# Patient Record
Sex: Female | Born: 1995 | Race: White | Hispanic: No | Marital: Single | Smoking: Never smoker
Health system: Southern US, Community
[De-identification: ages and names within clinical notes are randomized; demographics above are authoritative.]

## PROBLEM LIST (undated history)

## (undated) ENCOUNTER — Inpatient Hospital Stay: Payer: Self-pay

## (undated) ENCOUNTER — Emergency Department: Admission: EM | Payer: Medicaid Other

## (undated) DIAGNOSIS — F329 Major depressive disorder, single episode, unspecified: Secondary | ICD-10-CM

## (undated) DIAGNOSIS — Z87440 Personal history of urinary (tract) infections: Secondary | ICD-10-CM

## (undated) DIAGNOSIS — Z3042 Encounter for surveillance of injectable contraceptive: Secondary | ICD-10-CM

## (undated) DIAGNOSIS — O139 Gestational [pregnancy-induced] hypertension without significant proteinuria, unspecified trimester: Secondary | ICD-10-CM

## (undated) DIAGNOSIS — F909 Attention-deficit hyperactivity disorder, unspecified type: Secondary | ICD-10-CM

## (undated) DIAGNOSIS — F32A Depression, unspecified: Secondary | ICD-10-CM

## (undated) HISTORY — DX: Depression, unspecified: F32.A

## (undated) HISTORY — DX: Personal history of urinary (tract) infections: Z87.440

## (undated) HISTORY — PX: NO PAST SURGERIES: SHX2092

## (undated) HISTORY — DX: Gestational (pregnancy-induced) hypertension without significant proteinuria, unspecified trimester: O13.9

---
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
## 2014-01-04 LAB — OB RESULTS CONSOLE GC/CHLAMYDIA
Chlamydia: NEGATIVE
Gonorrhea: NEGATIVE

## 2014-03-04 NOTE — L&D Delivery Note (Signed)
VAGINAL DELIVERY NOTE:  Date of Delivery: 07/31/2014 Primary OB: [facility]  Gestational Age/EDD: 2564w2d 07/29/2014, by Ultrasound Antepartum complications: none Attending Physician: Annamarie MajorPaul Hasson Gaspard, MD, FACOG Delivery Type: spontaneous vaginal delivery  Anesthesia: epidural Laceration: 1st degree Episiotomy: none Placenta: spontaneous Intrapartum complications: None Estimated Blood Loss: 500ml GBS: POS Procedure Details: No complications  Baby: Liveborn female, Apgars 5/9, weight 8 #, 0 oz,

## 2014-03-05 ENCOUNTER — Emergency Department: Payer: Self-pay | Admitting: Emergency Medicine

## 2014-07-01 LAB — OB RESULTS CONSOLE GBS: STREP GROUP B AG: POSITIVE

## 2014-07-08 LAB — OB RESULTS CONSOLE GBS: GBS: POSITIVE

## 2014-07-22 ENCOUNTER — Observation Stay
Admission: EM | Admit: 2014-07-22 | Discharge: 2014-07-22 | Disposition: A | Discharge status: Home / Self Care | Payer: Medicaid Other | Attending: Obstetrics and Gynecology | Admitting: Obstetrics and Gynecology

## 2014-07-22 ENCOUNTER — Encounter: Payer: Self-pay | Admitting: *Deleted

## 2014-07-22 DIAGNOSIS — O26893 Other specified pregnancy related conditions, third trimester: Principal | ICD-10-CM | POA: Insufficient documentation

## 2014-07-22 DIAGNOSIS — R1030 Lower abdominal pain, unspecified: Secondary | ICD-10-CM | POA: Diagnosis present

## 2014-07-22 DIAGNOSIS — Z3A39 39 weeks gestation of pregnancy: Secondary | ICD-10-CM | POA: Diagnosis not present

## 2014-07-22 NOTE — H&P (Signed)
Obstetric H&P   Chief Complaint: Sharp abdominal pains  Prenatal Care Provider: [facility]  History of Present Illness: 19 y.o. G1 at 4539 weeks gestation presenting with intermittent sharp lower abdominal pain.  Radiate into groin, positional.  No LOF, no VB, no ctx.  Pregnancy at [facility] thus far uncomplicated.    Review of Systems: 10 point review of systems negative unless otherwise noted in HPI  Past Medical History: No past medical history on file.  Past Surgical History: No past surgical history on file.  Family History: No family history on file.  Social History: History   Social History  . Marital Status: Single    Spouse Name: N/A  . Number of Children: N/A  . Years of Education: N/A   Occupational History  . Not on file.   Social History Main Topics  . Smoking status: Not on file  . Smokeless tobacco: Not on file  . Alcohol Use: Not on file  . Drug Use: Not on file  . Sexual Activity: Not on file   Other Topics Concern  . Not on file   Social History Narrative  . No narrative on file    Medications: Prior to Admission medications   Not on File    Allergies: Allergies not on file  Physical Exam: Vitals: There were no vitals taken for this visit.  Urine Dip Protein: N/A  FHT: 140, moderate variability, +accles, no decels Toco: none  General: NAD Pulmonary: no increased work of breathing Abdomen: Gravid,  Non-tender, non-distended Leopolds: vtx, 6lbs Genitourinary: Dilation: 1 Effacement (%): 50 Cervical Position: Posterior Station: -3 Presentation: Vertex Exam by:: Bonney AidStaebler, MD  Extremities: no edema  Labs: No results found for this or any previous visit (from the past 24 hour(s)).  Assessment: 19 y.o. 3552w0d discomfort of pregnancy  Plan: 1) R/O labor - no contraction on toco and 1cm cervix firm and posterior  2) Fetus - category I tracing  3) Disposition - home, missed clinic appointment this week instructed to call and arrange early  next week

## 2014-07-26 ENCOUNTER — Observation Stay
Admission: EM | Admit: 2014-07-26 | Discharge: 2014-07-26 | Disposition: A | Discharge status: Home / Self Care | Payer: Medicaid Other | Attending: Obstetrics & Gynecology | Admitting: Obstetrics & Gynecology

## 2014-07-26 NOTE — Discharge Instructions (Signed)
Braxton Hicks Contractions °Contractions of the uterus can occur throughout pregnancy. Contractions are not always a sign that you are in labor.  °WHAT ARE BRAXTON HICKS CONTRACTIONS?  °Contractions that occur before labor are called Braxton Hicks contractions, or false labor. Toward the end of pregnancy (32-34 weeks), these contractions can develop more often and may become more forceful. This is not true labor because these contractions do not result in opening (dilatation) and thinning of the cervix. They are sometimes difficult to tell apart from true labor because these contractions can be forceful and people have different pain tolerances. You should not feel embarrassed if you go to the hospital with false labor. Sometimes, the only way to tell if you are in true labor is for your health care provider to look for changes in the cervix. °If there are no prenatal problems or other health problems associated with the pregnancy, it is completely safe to be sent home with false labor and await the onset of true labor. °HOW CAN YOU TELL THE DIFFERENCE BETWEEN TRUE AND FALSE LABOR? °False Labor °· The contractions of false labor are usually shorter and not as hard as those of true labor.   °· The contractions are usually irregular.   °· The contractions are often felt in the front of the lower abdomen and in the groin.   °· The contractions may go away when you walk around or change positions while lying down.   °· The contractions get weaker and are shorter lasting as time goes on.   °· The contractions do not usually become progressively stronger, regular, and closer together as with true labor.   °True Labor °· Contractions in true labor last 30-70 seconds, become very regular, usually become more intense, and increase in frequency.   °· The contractions do not go away with walking.   °· The discomfort is usually felt in the top of the uterus and spreads to the lower abdomen and low back.   °· True labor can be  determined by your health care provider with an exam. This will show that the cervix is dilating and getting thinner.   °WHAT TO REMEMBER °· Keep up with your usual exercises and follow other instructions given by your health care provider.   °· Take medicines as directed by your health care provider.   °· Keep your regular prenatal appointments.   °· Eat and drink lightly if you think you are going into labor.   °· If Braxton Hicks contractions are making you uncomfortable:   °¨ Change your position from lying down or resting to walking, or from walking to resting.   °¨ Sit and rest in a tub of warm water.   °¨ Drink 2-3 glasses of water. Dehydration may cause these contractions.   °¨ Do slow and deep breathing several times an hour.   °WHEN SHOULD I SEEK IMMEDIATE MEDICAL CARE? °Seek immediate medical care if: °· Your contractions become stronger, more regular, and closer together.   °· You have fluid leaking or gushing from your vagina.   °· You have a fever.   °· You pass blood-tinged mucus.   °· You have vaginal bleeding.   °· You have continuous abdominal pain.   °· You have low back pain that you never had before.   °· You feel your baby's head pushing down and causing pelvic pressure.   °· Your baby is not moving as much as it used to.   °Document Released: 02/18/2005 Document Revised: 02/23/2013 Document Reviewed: 11/30/2012 °[employer]® Patient Information ©2015 [employer]. This information is not intended to replace advice given to you by your health care   provider. Make sure you discuss any questions you have with your health care provider. ° °

## 2014-07-26 NOTE — Progress Notes (Signed)
Patient ID: [name], female   DOB: 1995/04/21, 19 y.o.   MRN: [id number]  No change in cervix after walking one hour.   Feels ctxs and they are moderate intensity. FWR Pt will go home w instructions for f/u

## 2014-07-26 NOTE — OB Triage Provider Note (Signed)
Progress Note CC: Ctx Pain  Subjective:   Vicente SereneKrista Gordon Dupre is a 19 y.o. female. She is at 633w4d gestation. She has ctx pain today.  Sex last pm.  Mucus d/c, no ROM.  No VB.  Seen last week and noted to be 1 cm.  Her pregnancy has been complicated by: none. PNC at [facility]. PMH, PSH, POBH and problem list reviewed Medications and allergies reviewed.    Objective:   General appearance: alert, well appearing, and in no distress. External fetal monitoring: 140s Abd gravid, NT Ctxs q 5-10 min SVE 1 cm  Assessment:   Pregnancy at 5933w4d with concerns for labor   Plan:    Ambulate Monitor for cervical change  Orders placed: Orders Placed This Encounter  Procedures  . Vitals signs per unit policy    Standing Status: Standing     Number of Occurrences: 1     Standing Expiration Date:   . Notify Physician    Standing Status: Standing     Number of Occurrences: 1     Standing Expiration Date:     Order Specific Question:  Notify Physician    Answer:  for vaginal bleeding    Order Specific Question:  Notify Physician    Answer:  for acute abdominal pain    Order Specific Question:  Notify Physician    Answer:  for temperature >/= 100.4 F    Order Specific Question:  Notify Physician    Answer:  for significant change in vital signs    Order Specific Question:  Notify Physician    Answer:  for non-reassuring fetal heart rate pattern    Order Specific Question:  Notify Physician    Answer:  for imminent delivery or failure to progress  . Fetal monitoring per unit policy    Standing Status: Standing     Number of Occurrences: 1     Standing Expiration Date:   . Cervical Exam    Unless contraindicated, every 1-2 hours in active labor, or at nurse's discretion.    Standing Status: Standing     Number of Occurrences: 1     Standing Expiration Date:   . Full code    Standing Status: Standing     Number of Occurrences: 1     Standing Expiration Date:   . Place in observation  (patient's expected length of stay will be less than 2 midnights)    Standing Status: Standing     Number of Occurrences: 1     Standing Expiration Date:     Order Specific Question:  Hospital Area    Answer:  [facility] [[id number]]    Order Specific Question:  Diagnosis    Answer:  Labor and delivery, indication for care [[id number]]    Order Specific Question:  Level of Care    Answer:  BIRTHING SUITE [17]    Order Specific Question:  Admitting Physician    Answer:  Nadara MustardHARRIS, Sire Poet P [[id number]]    Order Specific Question:  Attending Physician    Answer:  Nadara MustardHARRIS, Jerone Cudmore P [[id number]]    Order Specific Question:  PT Class (Do Not Modify)    Answer:  Observation [104]    Order Specific Question:  PT Acc Code (Do Not Modify)    Answer:  Observation [10022]    Patient expresses understanding of information provided and plan of care.

## 2014-07-26 NOTE — OB Triage Note (Signed)
Patient arrives with complain tof contractions that started this am at 0930, ongoing with a pain rating of 5/10. Reports discharge, blood tinged, denies SROM. Pt reports sexual intercourse last night.

## 2014-07-30 ENCOUNTER — Inpatient Hospital Stay: Payer: Medicaid Other | Admitting: Anesthesiology

## 2014-07-30 ENCOUNTER — Inpatient Hospital Stay
Admission: EM | Admit: 2014-07-30 | Discharge: 2014-08-01 | DRG: 775 | Disposition: A | Discharge status: Home / Self Care | Payer: Medicaid Other | Attending: Obstetrics & Gynecology | Admitting: Obstetrics & Gynecology

## 2014-07-30 ENCOUNTER — Inpatient Hospital Stay
Admission: EM | Admit: 2014-07-30 | Discharge: 2014-07-30 | Disposition: A | Discharge status: Home / Self Care | Payer: Medicaid Other | Source: Home / Self Care | Admitting: Obstetrics and Gynecology

## 2014-07-30 ENCOUNTER — Encounter: Payer: Self-pay | Admitting: *Deleted

## 2014-07-30 DIAGNOSIS — Z3A39 39 weeks gestation of pregnancy: Secondary | ICD-10-CM | POA: Diagnosis present

## 2014-07-30 DIAGNOSIS — O479 False labor, unspecified: Secondary | ICD-10-CM | POA: Diagnosis present

## 2014-07-30 DIAGNOSIS — Z3403 Encounter for supervision of normal first pregnancy, third trimester: Secondary | ICD-10-CM | POA: Diagnosis present

## 2014-07-30 HISTORY — DX: Attention-deficit hyperactivity disorder, unspecified type: F90.9

## 2014-07-30 LAB — CBC
HEMATOCRIT: 41.7 % (ref 35.0–47.0)
HEMOGLOBIN: 14.1 g/dL (ref 12.0–16.0)
MCH: 31.2 pg (ref 26.0–34.0)
MCHC: 33.8 g/dL (ref 32.0–36.0)
MCV: 92.2 fL (ref 80.0–100.0)
Platelets: 232 10*3/uL (ref 150–440)
RBC: 4.52 MIL/uL (ref 3.80–5.20)
RDW: 13.7 % (ref 11.5–14.5)
WBC: 15.5 10*3/uL — ABNORMAL HIGH (ref 3.6–11.0)

## 2014-07-30 LAB — TYPE AND SCREEN
ABO/RH(D): B POS
Antibody Screen: NEGATIVE

## 2014-07-30 LAB — ABO/RH: ABO/RH(D): B POS

## 2014-07-30 MED ORDER — SODIUM CHLORIDE 0.9 % IV SOLN
INTRAVENOUS | Status: AC
Start: 2014-07-30 — End: 2014-07-31
  Filled 2014-07-30: qty 2000

## 2014-07-30 MED ORDER — TERBUTALINE SULFATE 1 MG/ML IJ SOLN: 0.2500 mg | Freq: Once | INTRAMUSCULAR | Status: AC | PRN

## 2014-07-30 MED ORDER — BUTORPHANOL TARTRATE 1 MG/ML IJ SOLN
INTRAMUSCULAR | Status: AC
  Administered 2014-07-30: 1 mg
  Filled 2014-07-30: qty 1

## 2014-07-30 MED ORDER — OXYTOCIN 40 UNITS IN LACTATED RINGERS INFUSION - SIMPLE MED
INTRAVENOUS | Status: AC
  Administered 2014-07-30: 2 mL/h via INTRAVENOUS
  Filled 2014-07-30: qty 1000

## 2014-07-30 MED ORDER — LACTATED RINGERS IV SOLN
500.0000 mL | INTRAVENOUS | Status: DC | PRN
  Administered 2014-07-30: 1000 mL via INTRAVENOUS

## 2014-07-30 MED ORDER — OXYTOCIN 40 UNITS IN LACTATED RINGERS INFUSION - SIMPLE MED
62.5000 mL/h | INTRAVENOUS | Status: DC
  Administered 2014-07-30: 2 mL/h via INTRAVENOUS
  Administered 2014-07-30: 4 mL/h via INTRAVENOUS

## 2014-07-30 MED ORDER — SODIUM CHLORIDE 0.9 % IV SOLN
1.0000 g | INTRAVENOUS | Status: DC
  Administered 2014-07-30 (×2): 1 g via INTRAVENOUS
  Filled 2014-07-30 (×3): qty 1000

## 2014-07-30 MED ORDER — SODIUM CHLORIDE 0.9 % IV SOLN
2.0000 g | Freq: Once | INTRAVENOUS | Status: AC
  Administered 2014-07-30: 2 g via INTRAVENOUS

## 2014-07-30 MED ORDER — ACETAMINOPHEN 325 MG PO TABS: 650.0000 mg | ORAL_TABLET | ORAL | Status: DC | PRN

## 2014-07-30 MED ORDER — SODIUM CHLORIDE 0.9 % IV SOLN
INTRAVENOUS | Status: AC
  Administered 2014-07-30: 1 g via INTRAVENOUS
  Filled 2014-07-30: qty 1000

## 2014-07-30 MED ORDER — ONDANSETRON HCL 4 MG/2ML IJ SOLN: 4.0000 mg | Freq: Four times a day (QID) | INTRAMUSCULAR | Status: DC | PRN

## 2014-07-30 MED ORDER — OXYTOCIN BOLUS FROM INFUSION: 500.0000 mL | INTRAVENOUS | Status: DC

## 2014-07-30 MED ORDER — FENTANYL 2.5 MCG/ML W/ROPIVACAINE 0.2% IN NS 100 ML EPIDURAL INFUSION (ARMC-ANES)
10.0000 mL/h | EPIDURAL | Status: DC
  Administered 2014-07-30: 10 mL/h via EPIDURAL

## 2014-07-30 MED ORDER — LACTATED RINGERS IV BOLUS (SEPSIS)
500.0000 mL | Freq: Once | INTRAVENOUS | Status: AC
  Administered 2014-07-30: 1000 mL via INTRAVENOUS

## 2014-07-30 MED ORDER — FENTANYL 2.5 MCG/ML W/ROPIVACAINE 0.2% IN NS 100 ML EPIDURAL INFUSION (ARMC-ANES)
EPIDURAL | Status: AC
  Administered 2014-07-30: 10 mL/h via EPIDURAL
  Filled 2014-07-30: qty 100

## 2014-07-30 MED ORDER — BUTORPHANOL TARTRATE 1 MG/ML IJ SOLN
1.0000 mg | INTRAMUSCULAR | Status: DC | PRN
  Administered 2014-07-30: 1 mg via INTRAVENOUS

## 2014-07-30 MED ORDER — OXYTOCIN 10 UNIT/ML IJ SOLN
INTRAMUSCULAR | Status: AC
  Filled 2014-07-30: qty 2

## 2014-07-30 MED ORDER — AMMONIA AROMATIC IN INHA
RESPIRATORY_TRACT | Status: AC
  Filled 2014-07-30: qty 10

## 2014-07-30 MED ORDER — DIPHENHYDRAMINE HCL 50 MG/ML IJ SOLN
12.5000 mg | INTRAMUSCULAR | Status: DC | PRN
Start: 2014-07-30 — End: 2014-07-31

## 2014-07-30 MED ORDER — PHENYLEPHRINE 40 MCG/ML (10ML) SYRINGE FOR IV PUSH (FOR BLOOD PRESSURE SUPPORT)
80.0000 ug | PREFILLED_SYRINGE | INTRAVENOUS | Status: DC | PRN
  Filled 2014-07-30: qty 2

## 2014-07-30 MED ORDER — BUTORPHANOL TARTRATE 1 MG/ML IJ SOLN
INTRAMUSCULAR | Status: AC
  Administered 2014-07-30: 1 mg via INTRAVENOUS
  Filled 2014-07-30: qty 1

## 2014-07-30 MED ORDER — FENTANYL 2.5 MCG/ML BUPIVACAINE 1/10 % EPIDURAL INFUSION (WH - ANES): 10.0000 mL/h | INTRAMUSCULAR | Status: DC

## 2014-07-30 MED ORDER — ONDANSETRON HCL 4 MG/2ML IJ SOLN
INTRAMUSCULAR | Status: AC
  Administered 2014-07-30: 4 mg
  Filled 2014-07-30: qty 2

## 2014-07-30 MED ORDER — MISOPROSTOL 200 MCG PO TABS
ORAL_TABLET | ORAL | Status: AC
  Filled 2014-07-30: qty 4

## 2014-07-30 MED ORDER — LIDOCAINE HCL (PF) 1 % IJ SOLN
INTRAMUSCULAR | Status: AC
  Filled 2014-07-30: qty 30

## 2014-07-30 MED ORDER — OXYTOCIN 40 UNITS IN LACTATED RINGERS INFUSION - SIMPLE MED
1.0000 m[IU]/min | INTRAVENOUS | Status: DC
  Administered 2014-07-30: 2.667 m[IU]/min via INTRAVENOUS

## 2014-07-30 MED ORDER — LACTATED RINGERS IV SOLN
INTRAVENOUS | Status: DC
  Administered 2014-07-30 (×2): via INTRAVENOUS

## 2014-07-30 MED ORDER — EPHEDRINE 5 MG/ML INJ
10.0000 mg | INTRAVENOUS | Status: DC | PRN
  Filled 2014-07-30: qty 2

## 2014-07-30 MED ORDER — SODIUM CHLORIDE 0.9 % IV SOLN: 2.0000 g | INTRAVENOUS | Status: DC

## 2014-07-30 NOTE — Anesthesia Preprocedure Evaluation (Signed)
Anesthesia Evaluation  Patient identified by MRN, date of birth, ID band Patient awake    Reviewed: Allergy & Precautions, H&P , NPO status , Patient's Chart, lab work & pertinent test results  History of Anesthesia Complications Negative for: history of anesthetic complications  Airway Mallampati: II  TM Distance: >3 FB Neck ROM: Full    Dental   Pulmonary          Cardiovascular     Neuro/Psych    GI/Hepatic   Endo/Other    Renal/GU      Musculoskeletal   Abdominal   Peds  Hematology   Anesthesia Other Findings   Reproductive/Obstetrics (+) Pregnancy                             Anesthesia Physical Anesthesia Plan  ASA: II  Anesthesia Plan: Epidural   Post-op Pain Management:    Induction:   Airway Management Planned:   Additional Equipment:   Intra-op Plan:   Post-operative Plan:   Informed Consent: I have reviewed the patients History and Physical, chart, labs and discussed the procedure including the risks, benefits and alternatives for the proposed anesthesia with the patient or authorized representative who has indicated his/her understanding and acceptance.     Plan Discussed with:   Anesthesia Plan Comments:         Anesthesia Quick Evaluation

## 2014-07-30 NOTE — Progress Notes (Signed)
Clarification-Pt states that her OB caregiver will be with [facility] versus [facility]. Pt states she plans to choose a baby doctor from [facility]. Dr Vergie LivingPickens called and given report- states he agrees that pt may be d/c'ed at present at present

## 2014-07-30 NOTE — Anesthesia Procedure Notes (Signed)
Epidural Patient location during procedure: OB  Staffing Performed by: anesthesiologist   Preanesthetic Checklist Completed: patient identified, site marked, surgical consent, pre-op evaluation, timeout performed, IV checked, risks and benefits discussed and monitors and equipment checked  Epidural Patient position: sitting Prep: Betadine Patient monitoring: heart rate, continuous pulse ox and blood pressure Approach: midline Location: L4-L5 Injection technique: LOR saline  Needle:  Needle type: Tuohy  Needle gauge: 18 G Needle length: 9 cm and 9 Needle insertion depth: 8 cm Catheter type: closed end flexible Catheter size: 20 Guage Catheter at skin depth: 13 cm Test dose: negative and 1.5% lidocaine with Epi 1:200 K  Assessment Sensory level: T10 Events: blood not aspirated, injection not painful, no injection resistance, negative IV test and no paresthesia  Additional Notes   Patient tolerated the insertion well without complications.Reason for block:procedure for pain

## 2014-07-30 NOTE — Progress Notes (Signed)
  Labor Progress Note   19 y.o. G1P0000 @ 373w1d , admitted for  Pregnancy, Labor Management. 40 weeks  Subjective:  Epidural has helped w ctx pain.   Still at 6 cm.  Objective:  BP 115/51 mmHg  Pulse 89  Temp(Src) 98.6 F (37 C) (Oral)  Resp 18 Abd: moderate Extr: trace to 1+ bilateral pedal edema SVE: CERVIX: 6cm dilated, 80 effaced, 0 station  EFM: FHR: 140 bpm, variability: moderate,  accelerations:  Present,  decelerations:  Absent Toco: Frequency: Every 3 minutes Labs: I have reviewed the patient's lab results.  Assessment & Plan:  G1P0000 @ 4673w1d, admitted for  Pregnancy and Labor/Delivery Management  1. Pain management: Epidural now. 2. FWB: FHT category 1.  3. ID: GBS positive 4. Labor management: ABX.  5. Pitocin for augmentation discussed and ordered.  All discussed with patient, see orders

## 2014-07-30 NOTE — Progress Notes (Signed)
D/C to home discussed with pt and her significant other. Pt encouraged to push clear fluids and try to rest/ sleep if able. Instructions discussed as to when to return to hospital in regards to labor, SROM or if further concerns develop. Pt voicing understanding, all questions answered. Pt left [facility]thing Place.walking with significant other to ED parking area.

## 2014-07-30 NOTE — Progress Notes (Signed)
  Labor Progress Note   19 y.o. G1P0000 @ 622w1d , admitted for  Pregnancy, Labor Management. 40 weeks  Subjective:  Cont w painful ctxs  Objective:  Temp(Src) 98.7 F (37.1 C) (Oral)  Resp 18 Abd: moderate Extr: trace to 1+ bilateral pedal edema SVE: CERVIX: 4cm dilated, 80 effaced, -1 station  EFM: FHR: 140 bpm, variability: moderate,  accelerations:  Present,  decelerations:  Absent Toco: Frequency: Every 3 minutes Labs: I have reviewed the patient's lab results.  Assessment & Plan:  G1P0000 @ 4922w1d, admitted for  Pregnancy and Labor/Delivery Management  1. Pain management: IV sedation. Epidural now. 2. FWB: FHT category 1.  3. ID: GBS positive 4. Labor management: ABX. Analgesia.  All discussed with patient, see orders

## 2014-07-30 NOTE — Progress Notes (Signed)
  Labor Progress Note   19 y.o. G1P0000 @ 4678w1d , admitted for  Pregnancy, Labor Management. 40 weeks  Subjective:  Epidural has helped w ctx pain  Objective:  BP 115/59 mmHg  Pulse 84  Temp(Src) 98.6 F (37 C) (Oral)  Resp 18 Abd: moderate Extr: trace to 1+ bilateral pedal edema SVE: CERVIX: 6cm dilated, 80 effaced, 0 station  EFM: FHR: 140 bpm, variability: moderate,  accelerations:  Present,  decelerations:  Absent Toco: Frequency: Every 3 minutes Labs: I have reviewed the patient's lab results.  Assessment & Plan:  G1P0000 @ 2878w1d, admitted for  Pregnancy and Labor/Delivery Management  1. Pain management: IV sedation. Epidural now. 2. FWB: FHT category 1.  3. ID: GBS positive 4. Labor management: ABX.  5. AROM clear  All discussed with patient, see orders

## 2014-07-30 NOTE — H&P (Signed)
Obstetric H&P   Chief Complaint: Sharp abdominal pains  Prenatal Care Provider: [facility]  History of Present Illness: 19 y.o. G1 at 7639 weeks gestation presenting with intermittent sharp lower abdominal pain.  Radiate into groin, positional.  No LOF, no VB, no ctx.  Pregnancy at [facility] thus far uncomplicated.  Has been seen multiple times on L&D with ctx pains and cervix has consistantly been 1 cm dilated.    Review of Systems: 10 point review of systems negative unless otherwise noted in HPI  Past Medical History: Past Medical History  Diagnosis Date  . ADHD (attention deficit hyperactivity disorder)     ongoing    Past Surgical History: No past surgical history on file.  Family History: Family History  Problem Relation Age of Onset  . Family history unknown: Yes    Social History: History   Social History  . Marital Status: Single    Spouse Name: N/A  . Number of Children: N/A  . Years of Education: N/A   Occupational History  . Not on file.   Social History Main Topics  . Smoking status: Never Smoker   . Smokeless tobacco: Not on file  . Alcohol Use: No  . Drug Use: No  . Sexual Activity: Yes   Other Topics Concern  . Not on file   Social History Narrative    Medications: Prior to Admission medications   Not on File    Allergies: No Known Allergies  Physical Exam: Vitals: VSS  FHT: 140, moderate variability, +accles, no decels  General: NAD Pulmonary: no increased work of breathing Abdomen: Gravid,  Non-tender, non-distended Leopolds: vtx, EFW AGA Genitourinary: Dilation: 1 Effacement (%): 70 Cervical Position: Middle Station: -2  Extremities: no edema TOCO: q3-5 min ctxs.  Pt rates 9/10  Assessment: 19 y.o. G1P0 at 8279w1d discomfort of pregnancy  Plan: 1) R/O labor - monitor for ctxs and cervical change  2) Fetus - category I tracing  3) IVF, Stadol  4) Labor mgt options discussed if cervical change   Annamarie MajorPaul Shineka Auble, MD

## 2014-07-30 NOTE — Progress Notes (Signed)
Dr Linward FosterSchemmerhorn given report. Dr stating at this time pt maybe discharged home

## 2014-07-30 NOTE — Progress Notes (Signed)
  Labor Progress Note   19 y.o. G1P0000 @ 4678w1d , admitted for  Pregnancy, Labor Management. 40 weeks  Subjective:  Cont w painful ctxs  Objective:  Temp(Src) 98.7 F (37.1 C) (Oral)  Resp 18 Abd: moderate Extr: trace to 1+ bilateral pedal edema SVE: CERVIX: 2-3 cm dilated, 80 effaced, -2 station  EFM: FHR: 140 bpm, variability: moderate,  accelerations:  Present,  decelerations:  Absent Toco: Frequency: Every 3 minutes Labs: I have reviewed the patient's lab results.   Assessment & Plan:  G1P0000 @ 4278w1d, admitted for  Pregnancy and Labor/Delivery Management  1. Pain management: IV sedation. 2. FWB: FHT category 1.  3. ID: GBS positive 4. Labor management: ABX. Analgesia. Epidural when changes cervix again. All discussed with patient, see orders

## 2014-07-31 LAB — CBC
HCT: 35.2 % (ref 35.0–47.0)
HEMOGLOBIN: 12.2 g/dL (ref 12.0–16.0)
MCH: 31.8 pg (ref 26.0–34.0)
MCHC: 34.6 g/dL (ref 32.0–36.0)
MCV: 91.8 fL (ref 80.0–100.0)
PLATELETS: 233 10*3/uL (ref 150–440)
RBC: 3.83 MIL/uL (ref 3.80–5.20)
RDW: 13.3 % (ref 11.5–14.5)
WBC: 19.7 10*3/uL — ABNORMAL HIGH (ref 3.6–11.0)

## 2014-07-31 LAB — HIV ANTIBODY (ROUTINE TESTING W REFLEX): HIV Screen 4th Generation wRfx: NONREACTIVE

## 2014-07-31 LAB — RPR: RPR: NONREACTIVE

## 2014-07-31 MED ORDER — SODIUM CHLORIDE 0.9 % IV SOLN: 250.0000 mL | INTRAVENOUS | Status: DC | PRN

## 2014-07-31 MED ORDER — SENNOSIDES-DOCUSATE SODIUM 8.6-50 MG PO TABS
2.0000 | ORAL_TABLET | ORAL | Status: DC
  Administered 2014-08-01: 2 via ORAL
  Filled 2014-07-31: qty 2

## 2014-07-31 MED ORDER — WITCH HAZEL-GLYCERIN EX PADS
1.0000 "application " | MEDICATED_PAD | CUTANEOUS | Status: DC | PRN
  Administered 2014-08-01: 1 via TOPICAL
  Filled 2014-07-31: qty 100

## 2014-07-31 MED ORDER — IBUPROFEN 600 MG PO TABS
ORAL_TABLET | ORAL | Status: AC
  Filled 2014-07-31: qty 1

## 2014-07-31 MED ORDER — SODIUM CHLORIDE 0.9 % IJ SOLN: 3.0000 mL | Freq: Two times a day (BID) | INTRAMUSCULAR | Status: DC

## 2014-07-31 MED ORDER — OXYTOCIN 40 UNITS IN LACTATED RINGERS INFUSION - SIMPLE MED: 62.5000 mL/h | INTRAVENOUS | Status: DC | PRN

## 2014-07-31 MED ORDER — OXYCODONE-ACETAMINOPHEN 5-325 MG PO TABS
2.0000 | ORAL_TABLET | ORAL | Status: DC | PRN
Start: 2014-07-31 — End: 2014-08-01

## 2014-07-31 MED ORDER — PRENATAL MULTIVITAMIN CH
1.0000 | ORAL_TABLET | Freq: Every day | ORAL | Status: DC
  Administered 2014-07-31 – 2014-08-01 (×2): 1 via ORAL
  Filled 2014-07-31 (×2): qty 1

## 2014-07-31 MED ORDER — OXYCODONE-ACETAMINOPHEN 5-325 MG PO TABS
1.0000 | ORAL_TABLET | ORAL | Status: DC | PRN
  Administered 2014-07-31 – 2014-08-01 (×4): 1 via ORAL
  Filled 2014-07-31 (×4): qty 1

## 2014-07-31 MED ORDER — SODIUM CHLORIDE 0.9 % IJ SOLN: 3.0000 mL | INTRAMUSCULAR | Status: DC | PRN

## 2014-07-31 MED ORDER — LANOLIN HYDROUS EX OINT: TOPICAL_OINTMENT | CUTANEOUS | Status: DC | PRN

## 2014-07-31 MED ORDER — SIMETHICONE 80 MG PO CHEW: 80.0000 mg | CHEWABLE_TABLET | ORAL | Status: DC | PRN

## 2014-07-31 MED ORDER — BENZOCAINE-MENTHOL 20-0.5 % EX AERO: 1.0000 "application " | INHALATION_SPRAY | CUTANEOUS | Status: DC | PRN

## 2014-07-31 MED ORDER — ONDANSETRON HCL 4 MG/2ML IJ SOLN: 4.0000 mg | INTRAMUSCULAR | Status: DC | PRN

## 2014-07-31 MED ORDER — IBUPROFEN 600 MG PO TABS
600.0000 mg | ORAL_TABLET | Freq: Four times a day (QID) | ORAL | Status: DC
  Administered 2014-07-31 – 2014-08-01 (×6): 600 mg via ORAL
  Filled 2014-07-31 (×7): qty 1

## 2014-07-31 MED ORDER — ZOLPIDEM TARTRATE 5 MG PO TABS: 5.0000 mg | ORAL_TABLET | Freq: Every evening | ORAL | Status: DC | PRN

## 2014-07-31 MED ORDER — ONDANSETRON HCL 4 MG PO TABS
4.0000 mg | ORAL_TABLET | ORAL | Status: DC | PRN
Start: 2014-07-31 — End: 2014-08-01

## 2014-07-31 MED ORDER — DIBUCAINE 1 % RE OINT: 1.0000 "application " | TOPICAL_OINTMENT | RECTAL | Status: DC | PRN

## 2014-07-31 MED ORDER — ACETAMINOPHEN 325 MG PO TABS: 650.0000 mg | ORAL_TABLET | ORAL | Status: DC | PRN

## 2014-07-31 MED ORDER — DIPHENHYDRAMINE HCL 25 MG PO CAPS: 25.0000 mg | ORAL_CAPSULE | Freq: Four times a day (QID) | ORAL | Status: DC | PRN

## 2014-07-31 NOTE — Progress Notes (Signed)
Epidural cath removed. Blue tip intact and witnessed by pt.  No bleeding noted at site.

## 2014-07-31 NOTE — Progress Notes (Signed)
Pt delivered with MD at bedside.  Infant noted to have nuchal cord X1.  Cord clamped and handed off to nursery personal.

## 2014-07-31 NOTE — Anesthesia Postprocedure Evaluation (Signed)
  Anesthesia Post-op Note  Patient: Colleen JanusKrista Gordon Dupre  Procedure(s) Performed: * No procedures listed *  Anesthesia type:Epidural  Patient location: 337-A  Post pain: Pain level controlled  Post assessment: Post-op Vital signs reviewed, Patient's Cardiovascular Status Stable, Respiratory Function Stable, Patent Airway and No signs of Nausea or vomiting  Post vital signs: Reviewed and stable  Last Vitals:  Filed Vitals:   07/31/14 0253  BP: 111/56  Pulse: 115  Temp: 36.8 C  Resp: 16    Level of consciousness: awake, alert  and patient cooperative  Complications: No apparent anesthesia complications

## 2014-07-31 NOTE — Progress Notes (Signed)
Admit Date: 07/30/2014 Today's Date: 07/31/2014  Post Partum Day 1  Subjective:  no complaints and tolerating PO  Objective: Temp:  [97.7 F (36.5 C)-98.8 F (37.1 C)] 97.7 F (36.5 C) (05/29 0756) Pulse Rate:  [82-135] 86 (05/29 0756) Resp:  [16-18] 16 (05/29 0756) BP: (106-140)/(51-85) 106/61 mmHg (05/29 0756) SpO2:  [99 %] 99 % (05/29 0756) Weight:  [69.854 kg (154 lb)] 69.854 kg (154 lb) (05/28 1900)  Physical Exam:  General: alert, cooperative and appears stated age Lochia: appropriate Uterine Fundus: firm Incision: none DVT Evaluation: No evidence of DVT seen on physical exam.   Recent Labs  07/30/14 1420 07/31/14 0435  HGB 14.1 12.2  HCT 41.7 35.2    Assessment/Plan: Breastfeeding and Infant doing well   LOS: 1 day   HARRIS,ROBERT [facility] 07/31/2014, 11:16 AM

## 2014-07-31 NOTE — Progress Notes (Signed)
Pt was assisted to bathroom and voided.  Shown pericare.  Transferred via w/c in stable condition to PP Rm 337. IV remains infusing.

## 2014-07-31 NOTE — Discharge Summary (Signed)
Obstetrical Discharge Summary  Date of Admission: 07/30/2014 Date of Discharge: 08/01/2014 Primary OB:  [facility]   Gestational Age at Delivery: 7355w2d  Antepartum complications: none Date of Delivery: 5/28  Delivered By: Prudencio BurlyP HARRIS, MD Delivery Type: spontaneous vaginal delivery Intrapartum complications/course: None Anesthesia: epidural Placenta: spontaneous Laceration: 1st degree Episiotomy: none Baby: Liveborn female, Apgars 5/9, weight 8lbs.   Post partum course: no issues  Disposition: home with infant Rh Immune globulin given: not applicable Rubella vaccine given: not applicable Varicella vaccine given: not applicable Tdap vaccine given in AP or PP setting: ordered prior to discharge Flu vaccine given in AP or PP setting: given during prenatal care Contraception: IUD, pt desires no bridge but options d/w pt and if she changes her mind, she's to let RN know  B pos/RI/VI/rpr neg/hiv neg/hepB neg/tdap unknown/pap not applicable/bottle/IUD no bridge (d/w pt regarding pills and depo)/[facility] follow up  Plan:  Vicente SereneKrista Gordon Dupre was discharged to home in good condition. [facility]  Discharge Medications:   Medication List    TAKE these medications        docusate sodium 100 MG capsule  Commonly known as:  COLACE  Take 1 capsule (100 mg total) by mouth 2 (two) times daily as needed for mild constipation.     ibuprofen 200 MG tablet  Commonly known as:  ADVIL  Take 1 tablet (200 mg total) by mouth every 6 (six) hours as needed.     oxyCODONE-acetaminophen 5-325 MG per tablet  Commonly known as:  PERCOCET/ROXICET  Take 1 tablet by mouth every 4 (four) hours as needed (for pain scale 4-7).     prenatal multivitamin Tabs tablet  Take 1 tablet by mouth daily at 12 noon.       Cornelia Copaharlie Jefferie Holston, Jr MD [facility]  Pager: [phone]

## 2014-08-01 MED ORDER — IBUPROFEN 200 MG PO TABS: 200.0000 mg | ORAL_TABLET | Freq: Four times a day (QID) | ORAL | Status: DC | PRN

## 2014-08-01 MED ORDER — DOCUSATE SODIUM 100 MG PO CAPS: 100.0000 mg | ORAL_CAPSULE | Freq: Two times a day (BID) | ORAL | Status: DC | PRN

## 2014-08-01 MED ORDER — OXYCODONE-ACETAMINOPHEN 5-325 MG PO TABS: 1.0000 | ORAL_TABLET | ORAL | Status: DC | PRN

## 2014-08-01 MED ORDER — TETANUS-DIPHTH-ACELL PERTUSSIS 5-2.5-18.5 LF-MCG/0.5 IM SUSP: 0.5000 mL | Freq: Once | INTRAMUSCULAR | Status: DC

## 2014-08-01 NOTE — Discharge Instructions (Addendum)
Call the health department for a six week post partum visit  Postpartum Care After Vaginal Delivery After you deliver your newborn (postpartum period), the usual stay in the hospital is 24-72 hours. If there were problems with your labor or delivery, or if you have other medical problems, you might be in the hospital longer.  While you are in the hospital, you will receive help and instructions on how to care for yourself and your newborn during the postpartum period.  While you are in the hospital:  Be sure to tell your nurses if you have pain or discomfort, as well as where you feel the pain and what makes the pain worse.  If you had an incision made near your vagina (episiotomy) or if you had some tearing during delivery, the nurses may put ice packs on your episiotomy or tear. The ice packs may help to reduce the pain and swelling.  If you are breastfeeding, you may feel uncomfortable contractions of your uterus for a couple of weeks. This is normal. The contractions help your uterus get back to normal size.  It is normal to have some bleeding after delivery.  For the first 1-3 days after delivery, the flow is red and the amount may be similar to a period.  It is common for the flow to start and stop.  In the first few days, you may pass some small clots. Let your nurses know if you begin to pass large clots or your flow increases.  Do not  flush blood clots down the toilet before having the nurse look at them.  During the next 3-10 days after delivery, your flow should become more watery and pink or brown-tinged in color.  Ten to fourteen days after delivery, your flow should be a small amount of yellowish-white discharge.  The amount of your flow will decrease over the first few weeks after delivery. Your flow may stop in 6-8 weeks. Most women have had their flow stop by 12 weeks after delivery.  You should change your sanitary pads frequently.  Wash your hands thoroughly with  soap and water for at least 20 seconds after changing pads, using the toilet, or before holding or feeding your newborn.  You should feel like you need to empty your bladder within the first 6-8 hours after delivery.  In case you become weak, lightheaded, or faint, call your nurse before you get out of bed for the first time and before you take a shower for the first time.  Within the first few days after delivery, your breasts may begin to feel tender and full. This is called engorgement. Breast tenderness usually goes away within 48-72 hours after engorgement occurs. You may also notice milk leaking from your breasts. If you are not breastfeeding, do not stimulate your breasts. Breast stimulation can make your breasts produce more milk.  Spending as much time as possible with your newborn is very important. During this time, you and your newborn can feel close and get to know each other. Having your newborn stay in your room (rooming in) will help to strengthen the bond with your newborn. It will give you time to get to know your newborn and become comfortable caring for your newborn.  Your hormones change after delivery. Sometimes the hormone changes can temporarily cause you to feel sad or tearful. These feelings should not last more than a few days. If these feelings last longer than that, you should talk to your caregiver.  If  desired, talk to your caregiver about methods of family planning or contraception. Document Released: 12/16/2006 Document Revised: 11/13/2011 Document Reviewed: 10/16/2011 [facility] Patient Information 2015 [place], [place]. This information is not intended to replace advice given to you by your health care provider. Make sure you discuss any questions you have with your health care provider.

## 2014-08-01 NOTE — Progress Notes (Addendum)
Daily Post Partum Note  Jacques Colleen Chapman is a 19 y.o. G1P1001 PPD#2 s/p  SVD/1st (unknown if repaired) @ [redacted]w[redacted]d.  Pregnancy c/b nothing  24hr/overnight events:  none  Subjective:  Patient doing well and meeting all PP goals  Objective:    Current Vital Signs 24h Vital Sign Ranges  T 98.9 F (37.2 C) Temp  Avg: 98.1 F (36.7 C)  Min: 97.7 F (36.5 C)  Max: 98.9 F (37.2 C)  BP 121/70 mmHg BP  Min: 93/56  Max: 121/70  HR 78 Pulse  Avg: 81.8  Min: 76  Max: 88  RR 18 Resp  Avg: 18.4  Min: 16  Max: 20  SaO2 99 % Not Delivered SpO2  Avg: 99.5 %  Min: 99 %  Max: 100 %       24 Hour I/O Current Shift I/O  Time Ins Outs 05/29 0701 - 05/30 0700 In: 240 [P.O.:240] Out: 350 [Urine:350]      General: NAD Abdomen: FF below the umbilicus, NTTP, ND Perineum: deferred Skin:  Warm and dry.  Cardiovascular:Regular rate and rhythm. Respiratory:  Clear to auscultation bilateral. Normal respiratory effort Extremities: no c/c/e  Medications Current Facility-Administered Medications  Medication Dose Route Frequency Provider Last Rate Last Dose  . acetaminophen (TYLENOL) tablet 650 mg  650 mg Oral Q4H PRN Nadara Mustard, MD      . benzocaine-Menthol (DERMOPLAST) 20-0.5 % topical spray 1 application  1 application Topical PRN Nadara Mustard, MD      . witch hazel-glycerin (TUCKS) pad 1 application  1 application Topical PRN Nadara Mustard, MD       And  . dibucaine (NUPERCAINAL) 1 % rectal ointment 1 application  1 application Rectal PRN Nadara Mustard, MD      . diphenhydrAMINE (BENADRYL) capsule 25 mg  25 mg Oral Q6H PRN Nadara Mustard, MD      . ibuprofen (ADVIL,MOTRIN) tablet 600 mg  600 mg Oral 4 times per day Nadara Mustard, MD   600 mg at 08/01/14 0556  . lanolin ointment   Topical PRN Nadara Mustard, MD      . ondansetron [facility]) tablet 4 mg  4 mg Oral Q4H PRN Nadara Mustard, MD      . oxyCODONE-acetaminophen (PERCOCET/ROXICET) 5-325 MG per tablet 1 tablet  1 tablet Oral  Q4H PRN Nadara Mustard, MD   1 tablet at 08/01/14 0734  . oxyCODONE-acetaminophen (PERCOCET/ROXICET) 5-325 MG per tablet 2 tablet  2 tablet Oral Q4H PRN Nadara Mustard, MD      . prenatal multivitamin tablet 1 tablet  1 tablet Oral Q1200 Nadara Mustard, MD   1 tablet at 07/31/14 1207  . senna-docusate (Senokot-S) tablet 2 tablet  2 tablet Oral Q24H Nadara Mustard, MD      . simethicone [facility]) chewable tablet 80 mg  80 mg Oral PRN Nadara Mustard, MD      . zolpidem [facility]) tablet 5 mg  5 mg Oral QHS PRN Nadara Mustard, MD        Labs:   Recent Labs Lab 07/30/14 1420 07/31/14 0435  WBC 15.5* 19.7*  HGB 14.1 12.2  HCT 41.7 35.2  PLT 232 233   Radiology: none  Assessment & Plan:  Pt doing well *Postpartum/postop: routine care. tdap ordered *Dispo: home today. Pt told to ask RN if wants [facility] bridge  B pos/RI/VI/rpr neg/hiv neg/hepB neg/tdap unknown/pap not applicable/bottle/IUD no bridge (d/w pt regarding pills  and depo)/ACHD follow up  Cornelia Copaharlie Jenisis Harmsen, Jr. MD [facility] Pager [phone]

## 2014-08-01 NOTE — Progress Notes (Signed)
Pt discharged with infant. Discharge teaching completed.

## 2015-03-05 NOTE — L&D Delivery Note (Signed)
Delivery Note At 5:22 PM a viable and healthy female "Colleen Chapman" was delivered via Vaginal, Spontaneous Delivery (Presentation: ROA  ).  APGAR: 8, 9; weight pending skin to skin Placenta status: intact.  Cord: 3VC with the following complications: none  Anesthesia:  epidural Episiotomy:  none Lacerations:  none Suture Repair: n/a Est. Blood Loss (mL):  250  Mom to postpartum.  Baby to Couplet care / Skin to Skin. Bottle feeding/depo  Pt admitted in active labor. Cat I strip but on AROM, thick meconium fluid noted. Epidural for pain control. Baby's head delivered easily followed promptly by the anterior shoulder. The infant was placed skin to skin and delayed cord clamping x60sec. Cord cut by FOB. Placenta spon and intact. No trailing membranes. No lacerations. EBL minimal.  Darryel Diodato 02/20/2016, 5:36 PM

## 2015-03-28 ENCOUNTER — Encounter: Payer: Self-pay | Admitting: Emergency Medicine

## 2015-03-28 ENCOUNTER — Emergency Department
Admission: EM | Admit: 2015-03-28 | Discharge: 2015-03-28 | Disposition: A | Discharge status: Home / Self Care | Payer: Medicaid Other | Attending: Emergency Medicine | Admitting: Emergency Medicine

## 2015-03-28 DIAGNOSIS — R110 Nausea: Secondary | ICD-10-CM | POA: Insufficient documentation

## 2015-03-28 DIAGNOSIS — Z79899 Other long term (current) drug therapy: Secondary | ICD-10-CM | POA: Insufficient documentation

## 2015-03-28 DIAGNOSIS — Z3202 Encounter for pregnancy test, result negative: Secondary | ICD-10-CM | POA: Insufficient documentation

## 2015-03-28 LAB — POCT PREGNANCY, URINE: Preg Test, Ur: NEGATIVE

## 2015-03-28 MED ORDER — RANITIDINE HCL 150 MG PO TABS: 150.0000 mg | ORAL_TABLET | Freq: Two times a day (BID) | ORAL | Status: DC

## 2015-03-28 NOTE — ED Notes (Signed)
Pt to ed with c/o nausea since yesterday.  Pt denies abd pain, denies diarrhea, denies vomiting.  Pt reports recent stress and feels like it may be making her sick,

## 2015-03-28 NOTE — Discharge Instructions (Signed)
Please seek medical attention for any high fevers, chest pain, shortness of breath, change in behavior, persistent vomiting, bloody stool or any other new or concerning symptoms. ° ° °Nausea, Adult °Nausea is the feeling that you have an upset stomach or have to vomit. Nausea by itself is not likely a serious concern, but it may be an early sign of more serious medical problems. As nausea gets worse, it can lead to vomiting. If vomiting develops, there is the risk of dehydration.  °CAUSES  °· Viral infections. °· Food poisoning. °· Medicines. °· Pregnancy. °· Motion sickness. °· Migraine headaches. °· Emotional distress. °· Severe pain from any source. °· Alcohol intoxication. °HOME CARE INSTRUCTIONS °· Get plenty of rest. °· Ask your caregiver about specific rehydration instructions. °· Eat small amounts of food and sip liquids more often. °· Take all medicines as told by your caregiver. °SEEK MEDICAL CARE IF: °· You have not improved after 2 days, or you get worse. °· You have a headache. °SEEK IMMEDIATE MEDICAL CARE IF:  °· You have a fever. °· You faint. °· You keep vomiting or have blood in your vomit. °· You are extremely weak or dehydrated. °· You have dark or bloody stools. °· You have severe chest or abdominal pain. °MAKE SURE YOU: °· Understand these instructions. °· Will watch your condition. °· Will get help right away if you are not doing well or get worse. °  °This information is not intended to replace advice given to you by your health care provider. Make sure you discuss any questions you have with your health care provider. °  °Document Released: 03/28/2004 Document Revised: 03/11/2014 Document Reviewed: 10/31/2010 °Elsevier Interactive Patient Education ©2016 Elsevier Inc. ° °

## 2015-03-28 NOTE — ED Provider Notes (Signed)
[facility] Emergency Department Provider Note    ____________________________________________  Time seen: 1410  I have reviewed the triage vital signs and the nursing notes.   HISTORY  Chief Complaint Nausea   History limited by: Not Limited   HPI Colleen Chapman is a 20 y.o. female with history of ADHD who presents to the emergency department today because of "butterflies in my stomach". She states this is been going on for the past 3 days. It is intermittent. She stated is worse in the morning. She denies any associated abdominal pain. She denies any associated vomiting. She denies any associated fevers. No change in defecation. No problems with urination. In addition the patient states that she has had a decreased appetite since the birth of her child. She does not have a primary care doctor.     Past Medical History  Diagnosis Date  . ADHD (attention deficit hyperactivity disorder)     ongoing    Patient Active Problem List   Diagnosis Date Noted  . Postpartum care following vaginal delivery 07/31/2014  . Irregular contractions 07/30/2014  . Labor and delivery, indication for care 07/26/2014  . Indication for care in labor or delivery 07/22/2014    History reviewed. No pertinent past surgical history.  Current Outpatient Rx  Name  Route  Sig  Dispense  Refill  . docusate sodium (COLACE) 100 MG capsule   Oral   Take 1 capsule (100 mg total) by mouth 2 (two) times daily as needed for mild constipation.   60 capsule   1   . ibuprofen (ADVIL) 200 MG tablet   Oral   Take 1 tablet (200 mg total) by mouth every 6 (six) hours as needed.   60 tablet   1   . oxyCODONE-acetaminophen (PERCOCET/ROXICET) 5-325 MG per tablet   Oral   Take 1 tablet by mouth every 4 (four) hours as needed (for pain scale 4-7).   15 tablet   0   . Prenatal Vit-Fe Fumarate-FA (PRENATAL MULTIVITAMIN) TABS tablet   Oral   Take 1 tablet by mouth daily at 12  noon.           Allergies Review of patient's allergies indicates no known allergies.  Family History  Problem Relation Age of Onset  . Family history unknown: Yes    Social History Social History  Substance Use Topics  . Smoking status: Never Smoker   . Smokeless tobacco: None  . Alcohol Use: No    Review of Systems  Constitutional: Negative for fever. Cardiovascular: Negative for chest pain. Respiratory: Negative for shortness of breath. Gastrointestinal: Negative for abdominal pain, vomiting and diarrhea. Neurological: Negative for headaches, focal weakness or numbness.   10-point ROS otherwise negative.  ____________________________________________   PHYSICAL EXAM:  VITAL SIGNS: ED Triage Vitals  Enc Vitals Group     BP 03/28/15 1245 127/69 mmHg     Pulse Rate 03/28/15 1245 66     Resp 03/28/15 1245 20     Temp 03/28/15 1245 97.5 F (36.4 C)     Temp Source 03/28/15 1245 Oral     SpO2 03/28/15 1245 100 %     Weight 03/28/15 1245 140 lb (63.504 kg)     Height 03/28/15 1245  (1.676 m)     Head Cir --      Peak Flow --      Pain Score 03/28/15 1245 0   Constitutional: Alert and oriented. Well appearing and in no distress.  Eyes: Conjunctivae are normal. PERRL. Normal extraocular movements. ENT   Head: Normocephalic and atraumatic.   Nose: No congestion/rhinnorhea.   Mouth/Throat: Mucous membranes are moist.   Neck: No stridor. Hematological/Lymphatic/Immunilogical: No cervical lymphadenopathy. Cardiovascular: Normal rate, regular rhythm.  No murmurs, rubs, or gallops. Respiratory: Normal respiratory effort without tachypnea nor retractions. Breath sounds are clear and equal bilaterally. No wheezes/rales/rhonchi. Gastrointestinal: Soft and nontender. No distention. There is no CVA tenderness. Genitourinary: Deferred Musculoskeletal: Normal range of motion in all extremities. No joint effusions.   Neurologic:  Normal speech and  language. No gross focal neurologic deficits are appreciated.  Skin:  Skin is warm, dry and intact. No rash noted. Psychiatric: Mood and affect are normal. Speech and behavior are normal. Patient exhibits appropriate insight and judgment.  ____________________________________________    LABS (pertinent positives/negatives)  Labs Reviewed  POCT PREGNANCY, URINE     ____________________________________________   EKG  None  ____________________________________________    RADIOLOGY  None  ____________________________________________   PROCEDURES  Procedure(s) performed: None  Critical Care performed: No  ____________________________________________   INITIAL IMPRESSION / ASSESSMENT AND PLAN / ED COURSE  Pertinent labs & imaging results that were available during my care of the patient were reviewed by me and considered in my medical decision making (see chart for details).   patient presented to the emergency department today because of concerns for butterflies in her stomach. Patient has no other complaints of pain, fever, vomiting, diarrhea, change in urination. In addition her abdominal exam and the rest of her physical exam is completely benign. Patient vital signs within normal limits. Patient's pregnancy test was negative. At this point I do not feel the patient is suffering from gallbladder disease, appendicitis or other concerning intra-abdominal pathology. I think it is possible that the increased stress of the patient was talking about this causing some gastritis. Will place on an acid. I did discuss with the patient about importance of establishing care with primary care doctor.   ____________________________________________   FINAL CLINICAL IMPRESSION(S) / ED DIAGNOSES  Final diagnoses:  Nausea     Phineas Semen, MD 03/28/15 1443

## 2015-04-17 ENCOUNTER — Emergency Department
Admission: EM | Admit: 2015-04-17 | Discharge: 2015-04-17 | Disposition: A | Discharge status: Home / Self Care | Payer: Medicaid Other | Attending: Emergency Medicine | Admitting: Emergency Medicine

## 2015-04-17 DIAGNOSIS — R110 Nausea: Secondary | ICD-10-CM

## 2015-04-17 DIAGNOSIS — Z79899 Other long term (current) drug therapy: Secondary | ICD-10-CM | POA: Insufficient documentation

## 2015-04-17 DIAGNOSIS — Z3202 Encounter for pregnancy test, result negative: Secondary | ICD-10-CM | POA: Insufficient documentation

## 2015-04-17 DIAGNOSIS — R1012 Left upper quadrant pain: Secondary | ICD-10-CM

## 2015-04-17 LAB — URINALYSIS COMPLETE WITH MICROSCOPIC (ARMC ONLY)
BILIRUBIN URINE: NEGATIVE
Bacteria, UA: NONE SEEN
Glucose, UA: NEGATIVE mg/dL
Hgb urine dipstick: NEGATIVE
Ketones, ur: NEGATIVE mg/dL
Nitrite: NEGATIVE
Protein, ur: NEGATIVE mg/dL
Specific Gravity, Urine: 1.029 (ref 1.005–1.030)
pH: 5 (ref 5.0–8.0)

## 2015-04-17 LAB — COMPREHENSIVE METABOLIC PANEL
ALK PHOS: 74 U/L (ref 38–126)
ALT: 13 U/L — AB (ref 14–54)
AST: 17 U/L (ref 15–41)
Albumin: 4.5 g/dL (ref 3.5–5.0)
Anion gap: 5 (ref 5–15)
BILIRUBIN TOTAL: 0.9 mg/dL (ref 0.3–1.2)
BUN: 12 mg/dL (ref 6–20)
CALCIUM: 8.8 mg/dL — AB (ref 8.9–10.3)
CO2: 27 mmol/L (ref 22–32)
CREATININE: 0.76 mg/dL (ref 0.44–1.00)
Chloride: 106 mmol/L (ref 101–111)
GFR calc non Af Amer: >60 mL/min (ref >60)
Glucose, Bld: 82 mg/dL (ref 65–99)
Potassium: 3.7 mmol/L (ref 3.5–5.1)
Sodium: 138 mmol/L (ref 135–145)
TOTAL PROTEIN: 7.8 g/dL (ref 6.5–8.1)

## 2015-04-17 LAB — CBC
HCT: 45.7 % (ref 35.0–47.0)
Hemoglobin: 15.2 g/dL (ref 12.0–16.0)
MCH: 29.7 pg (ref 26.0–34.0)
MCHC: 33.3 g/dL (ref 32.0–36.0)
MCV: 89.2 fL (ref 80.0–100.0)
PLATELETS: 274 10*3/uL (ref 150–440)
RBC: 5.12 MIL/uL (ref 3.80–5.20)
RDW: 12.3 % (ref 11.5–14.5)
WBC: 9.7 10*3/uL (ref 3.6–11.0)

## 2015-04-17 LAB — LIPASE, BLOOD: Lipase: 23 U/L (ref 11–51)

## 2015-04-17 LAB — POCT PREGNANCY, URINE: Preg Test, Ur: NEGATIVE

## 2015-04-17 MED ORDER — RANITIDINE HCL 75 MG PO TABS: 75.0000 mg | ORAL_TABLET | Freq: Two times a day (BID) | ORAL | Status: DC

## 2015-04-17 MED ORDER — METOCLOPRAMIDE HCL 10 MG PO TABS
10.0000 mg | ORAL_TABLET | Freq: Four times a day (QID) | ORAL | Status: DC | PRN
Start: 2015-04-17 — End: 2016-02-22

## 2015-04-17 MED ORDER — ONDANSETRON 4 MG PO TBDP
4.0000 mg | ORAL_TABLET | Freq: Once | ORAL | Status: AC
  Administered 2015-04-17: 4 mg via ORAL
  Filled 2015-04-17: qty 1

## 2015-04-17 NOTE — ED Notes (Signed)
Pt verbalizes understanding of discharge instructions.

## 2015-04-17 NOTE — Discharge Instructions (Signed)
Abdominal Pain, Adult °Many things can cause abdominal pain. Usually, abdominal pain is not caused by a disease and will improve without treatment. It can often be observed and treated at home. Your health care provider will do a physical exam and possibly order blood tests and X-rays to help determine the seriousness of your pain. However, in many cases, more time must pass before a clear cause of the pain can be found. Before that point, your health care provider may not know if you need more testing or further treatment. °HOME CARE INSTRUCTIONS °Monitor your abdominal pain for any changes. The following actions may help to alleviate any discomfort you are experiencing: °· Only take over-the-counter or prescription medicines as directed by your health care provider. °· Do not take laxatives unless directed to do so by your health care provider. °· Try a clear liquid diet (broth, tea, or water) as directed by your health care provider. Slowly move to a bland diet as tolerated. °SEEK MEDICAL CARE IF: °· You have unexplained abdominal pain. °· You have abdominal pain associated with nausea or diarrhea. °· You have pain when you urinate or have a bowel movement. °· You experience abdominal pain that wakes you in the night. °· You have abdominal pain that is worsened or improved by eating food. °· You have abdominal pain that is worsened with eating fatty foods. °· You have a fever. °SEEK IMMEDIATE MEDICAL CARE IF: °· Your pain does not go away within 2 hours. °· You keep throwing up (vomiting). °· Your pain is felt only in portions of the abdomen, such as the right side or the left lower portion of the abdomen. °· You pass bloody or black tarry stools. °MAKE SURE YOU: °· Understand these instructions. °· Will watch your condition. °· Will get help right away if you are not doing well or get worse. °  °This information is not intended to replace advice given to you by your health care provider. Make sure you discuss  any questions you have with your health care provider. °  °Document Released: 11/28/2004 Document Revised: 11/09/2014 Document Reviewed: 10/28/2012 °Elsevier Interactive Patient Education ©2016 Elsevier Inc. ° °Nausea, Adult °Nausea is the feeling that you have an upset stomach or have to vomit. Nausea by itself is not likely a serious concern, but it may be an early sign of more serious medical problems. As nausea gets worse, it can lead to vomiting. If vomiting develops, there is the risk of dehydration.  °CAUSES  °· Viral infections. °· Food poisoning. °· Medicines. °· Pregnancy. °· Motion sickness. °· Migraine headaches. °· Emotional distress. °· Severe pain from any source. °· Alcohol intoxication. °HOME CARE INSTRUCTIONS °· Get plenty of rest. °· Ask your caregiver about specific rehydration instructions. °· Eat small amounts of food and sip liquids more often. °· Take all medicines as told by your caregiver. °SEEK MEDICAL CARE IF: °· You have not improved after 2 days, or you get worse. °· You have a headache. °SEEK IMMEDIATE MEDICAL CARE IF:  °· You have a fever. °· You faint. °· You keep vomiting or have blood in your vomit. °· You are extremely weak or dehydrated. °· You have dark or bloody stools. °· You have severe chest or abdominal pain. °MAKE SURE YOU: °· Understand these instructions. °· Will watch your condition. °· Will get help right away if you are not doing well or get worse. °  °This information is not intended to replace advice given to   you by your health care provider. Make sure you discuss any questions you have with your health care provider. °  °Document Released: 03/28/2004 Document Revised: 03/11/2014 Document Reviewed: 10/31/2010 °Elsevier Interactive Patient Education ©2016 Elsevier Inc. ° °

## 2015-04-17 NOTE — ED Notes (Signed)
Pt reports nausea this morning after she ate a honey bunny and some juice reports she had to leave work because she felt nausea, denies any episodes of emesis, pt requested a Dr. Note because she left work at [employer]

## 2015-04-17 NOTE — ED Notes (Signed)
Pt arrives to ER from home c/o nausea that began this AM. Pt states that her son just got over a virus and she is unsure if she has the virus or is pregnant. Last menstrual period was sometime last month, pt unsure when. Pt has not taken any pregnancy test.

## 2015-04-17 NOTE — ED Provider Notes (Signed)
[facility] Emergency Department Provider Note  ____________________________________________  Time seen: Approximately 840 PM  I have reviewed the triage vital signs and the nursing notes.   HISTORY  Chief Complaint Nausea    HPI Colleen Chapman is a 20 y.o. female with a history of ADHD who is presenting today with upper abdominal cramping as well as nausea. She says that the issues have been ongoing since yesterday. She also has a son at home who is having a similar illness except she is vomiting. She says the pain is cramping and nonradiating. The pain is mild at this time. Denies any cough, fever or runny nose. Also concerned that she may be pregnant.Denies any urinary frequency or burning with urination.   Past Medical History  Diagnosis Date  . ADHD (attention deficit hyperactivity disorder)     ongoing    Patient Active Problem List   Diagnosis Date Noted  . Postpartum care following vaginal delivery 07/31/2014  . Irregular contractions 07/30/2014  . Labor and delivery, indication for care 07/26/2014  . Indication for care in labor or delivery 07/22/2014    History reviewed. No pertinent past surgical history.  Current Outpatient Rx  Name  Route  Sig  Dispense  Refill  . docusate sodium (COLACE) 100 MG capsule   Oral   Take 1 capsule (100 mg total) by mouth 2 (two) times daily as needed for mild constipation.   60 capsule   1   . ibuprofen (ADVIL) 200 MG tablet   Oral   Take 1 tablet (200 mg total) by mouth every 6 (six) hours as needed.   60 tablet   1   . oxyCODONE-acetaminophen (PERCOCET/ROXICET) 5-325 MG per tablet   Oral   Take 1 tablet by mouth every 4 (four) hours as needed (for pain scale 4-7).   15 tablet   0   . Prenatal Vit-Fe Fumarate-FA (PRENATAL MULTIVITAMIN) TABS tablet   Oral   Take 1 tablet by mouth daily at 12 noon.         . ranitidine (ZANTAC) 150 MG tablet   Oral   Take 1 tablet (150 mg total) by  mouth 2 (two) times daily.   60 tablet   0     Allergies Review of patient's allergies indicates no known allergies.  Family History  Problem Relation Age of Onset  . Family history unknown: Yes    Social History Social History  Substance Use Topics  . Smoking status: Never Smoker   . Smokeless tobacco: None  . Alcohol Use: No    Review of Systems Constitutional: No fever/chills Eyes: No visual changes. ENT: No sore throat. Cardiovascular: Denies chest pain. Respiratory: Denies shortness of breath. Gastrointestinal:  no vomiting.  No diarrhea.  No constipation. Genitourinary: Negative for dysuria. Musculoskeletal: Negative for back pain. Skin: Negative for rash. Neurological: Negative for headaches, focal weakness or numbness.  10-point ROS otherwise negative.  ____________________________________________   PHYSICAL EXAM:  VITAL SIGNS: ED Triage Vitals  Enc Vitals Group     BP 04/17/15 1819 122/80 mmHg     Pulse Rate 04/17/15 1819 90     Resp 04/17/15 1819 18     Temp 04/17/15 1819 98.2 F (36.8 C)     Temp Source 04/17/15 1819 Oral     SpO2 04/17/15 1819 99 %     Weight 04/17/15 1819 133 lb 8 oz (60.555 kg)     Height 04/17/15 1819  (1.676 m)  Head Cir --      Peak Flow --      Pain Score --      Pain Loc --      Pain Edu? --      Excl. in GC? --     Constitutional: Alert and oriented. Well appearing and in no acute distress. Eyes: Conjunctivae are normal. PERRL. EOMI. Head: Atraumatic. Nose: No congestion/rhinnorhea. Mouth/Throat: Mucous membranes are moist.   Neck: No stridor.   Cardiovascular: Normal rate, regular rhythm. Grossly normal heart sounds.  Good peripheral circulation. Respiratory: Normal respiratory effort.  No retractions. Lungs CTAB. Gastrointestinal: Soft with mild left upper quadrant tenderness to palpation. No distention. No abdominal bruits. No CVA tenderness. Musculoskeletal: No lower extremity tenderness nor edema.   No joint effusions. Neurologic:  Normal speech and language. No gross focal neurologic deficits are appreciated. No gait instability. Skin:  Skin is warm, dry and intact. No rash noted. Psychiatric: Mood and affect are normal. Speech and behavior are normal.  ____________________________________________   LABS (all labs ordered are listed, but only abnormal results are displayed)  Labs Reviewed  COMPREHENSIVE METABOLIC PANEL - Abnormal; Notable for the following:    Calcium 8.8 (*)    ALT 13 (*)    All other components within normal limits  URINALYSIS COMPLETEWITH MICROSCOPIC ([facility] ONLY) - Abnormal; Notable for the following:    Color, Urine YELLOW (*)    APPearance HAZY (*)    Leukocytes, UA 2+ (*)    Squamous Epithelial / LPF 6-30 (*)    All other components within normal limits  LIPASE, BLOOD  CBC  POC URINE PREG, ED  POCT PREGNANCY, URINE   ____________________________________________  EKG   ____________________________________________  RADIOLOGY   ____________________________________________   PROCEDURES   ____________________________________________   INITIAL IMPRESSION / ASSESSMENT AND PLAN / ED COURSE  Pertinent labs & imaging results that were available during my care of the patient were reviewed by me and considered in my medical decision making (see chart for details).  Patient likely with viral illness with stomach irritation such as gastritis. No lower abdominal tenderness right lower quadrant tenderness to palpation. Counseled about return precautions. Will be discharged with antiemetic as well as antacid. ____________________________________________   FINAL CLINICAL IMPRESSION(S) / ED DIAGNOSES  Abdominal pain. Nausea.    Myrna Blazer, MD 04/17/15 2053

## 2015-05-25 ENCOUNTER — Emergency Department
Admission: EM | Admit: 2015-05-25 | Discharge: 2015-05-25 | Disposition: A | Discharge status: Home / Self Care | Payer: Medicaid Other | Attending: Emergency Medicine | Admitting: Emergency Medicine

## 2015-05-25 ENCOUNTER — Emergency Department: Payer: Medicaid Other

## 2015-05-25 ENCOUNTER — Encounter: Payer: Self-pay | Admitting: Emergency Medicine

## 2015-05-25 DIAGNOSIS — Z79899 Other long term (current) drug therapy: Secondary | ICD-10-CM | POA: Insufficient documentation

## 2015-05-25 DIAGNOSIS — O039 Complete or unspecified spontaneous abortion without complication: Secondary | ICD-10-CM | POA: Insufficient documentation

## 2015-05-25 DIAGNOSIS — O209 Hemorrhage in early pregnancy, unspecified: Secondary | ICD-10-CM | POA: Diagnosis present

## 2015-05-25 DIAGNOSIS — Z8659 Personal history of other mental and behavioral disorders: Secondary | ICD-10-CM | POA: Insufficient documentation

## 2015-05-25 DIAGNOSIS — Z3A00 Weeks of gestation of pregnancy not specified: Secondary | ICD-10-CM | POA: Insufficient documentation

## 2015-05-25 LAB — URINALYSIS COMPLETE WITH MICROSCOPIC (ARMC ONLY)
BILIRUBIN URINE: NEGATIVE
GLUCOSE, UA: NEGATIVE mg/dL
Ketones, ur: NEGATIVE mg/dL
NITRITE: NEGATIVE
PH: 6 (ref 5.0–8.0)
Protein, ur: 100 mg/dL — AB
SPECIFIC GRAVITY, URINE: 1.028 (ref 1.005–1.030)

## 2015-05-25 LAB — POC URINE PREG, ED: PREG TEST UR: NEGATIVE

## 2015-05-25 LAB — HCG, QUANTITATIVE, PREGNANCY: HCG, BETA CHAIN, QUANT, S: 17 m[IU]/mL — AB (ref <5)

## 2015-05-25 NOTE — ED Notes (Signed)
Patient transported to Ultrasound 

## 2015-05-25 NOTE — ED Notes (Signed)
Pt to ed with c/o vaginal bleeding,  Pt states she was seen at health dept on Monday and had positive pregnancy test.  Pt unsure of lmp.  Also reports abd cramping.

## 2015-05-25 NOTE — Discharge Instructions (Signed)
Miscarriage  A miscarriage is the sudden loss of an unborn baby (fetus) before the 20th week of pregnancy. Most miscarriages happen in the first 3 months of pregnancy. Sometimes, it happens before a woman even knows she is pregnant. A miscarriage is also called a "spontaneous miscarriage" or "early pregnancy loss." Having a miscarriage can be an emotional experience. Talk with your caregiver about any questions you may have about miscarrying, the grieving process, and your future pregnancy plans.  CAUSES    Problems with the fetal chromosomes that make it impossible for the baby to develop normally. Problems with the baby's genes or chromosomes are most often the result of errors that occur, by chance, as the embryo divides and grows. The problems are not inherited from the parents.   Infection of the cervix or uterus.    Hormone problems.    Problems with the cervix, such as having an incompetent cervix. This is when the tissue in the cervix is not strong enough to hold the pregnancy.    Problems with the uterus, such as an abnormally shaped uterus, uterine fibroids, or congenital abnormalities.    Certain medical conditions.    Smoking, drinking alcohol, or taking illegal drugs.    Trauma.   Often, the cause of a miscarriage is unknown.   SYMPTOMS    Vaginal bleeding or spotting, with or without cramps or pain.   Pain or cramping in the abdomen or lower back.   Passing fluid, tissue, or blood clots from the vagina.  DIAGNOSIS   Your caregiver will perform a physical exam. You may also have an ultrasound to confirm the miscarriage. Blood or urine tests may also be ordered.  TREATMENT    Sometimes, treatment is not necessary if you naturally pass all the fetal tissue that was in the uterus. If some of the fetus or placenta remains in the body (incomplete miscarriage), tissue left behind may become infected and must be removed. Usually, a dilation and curettage (D and C) procedure is performed.  During a D and C procedure, the cervix is widened (dilated) and any remaining fetal or placental tissue is gently removed from the uterus.   Antibiotic medicines are prescribed if there is an infection. Other medicines may be given to reduce the size of the uterus (contract) if there is a lot of bleeding.   If you have Rh negative blood and your baby was Rh positive, you will need a Rh immunoglobulin shot. This shot will protect any future baby from having Rh blood problems in future pregnancies.  HOME CARE INSTRUCTIONS    Your caregiver may order bed rest or may allow you to continue light activity. Resume activity as directed by your caregiver.   Have someone help with home and family responsibilities during this time.    Keep track of the number of sanitary pads you use each day and how soaked (saturated) they are. Write down this information.    Do not use tampons. Do not douche or have sexual intercourse until approved by your caregiver.    Only take over-the-counter or prescription medicines for pain or discomfort as directed by your caregiver.    Do not take aspirin. Aspirin can cause bleeding.    Keep all follow-up appointments with your caregiver.    If you or your partner have problems with grieving, talk to your caregiver or seek counseling to help cope with the pregnancy loss. Allow enough time to grieve before trying to get pregnant again.     SEEK IMMEDIATE MEDICAL CARE IF:    You have severe cramps or pain in your back or abdomen.   You have a fever.   You pass large blood clots (walnut-sized or larger) ortissue from your vagina. Save any tissue for your caregiver to inspect.    Your bleeding increases.    You have a thick, bad-smelling vaginal discharge.   You become lightheaded, weak, or you faint.    You have chills.   MAKE SURE YOU:   Understand these instructions.   Will watch your condition.   Will get help right away if you are not doing well or get worse.     This  information is not intended to replace advice given to you by your health care provider. Make sure you discuss any questions you have with your health care provider.     Document Released: 08/14/2000 Document Revised: 06/15/2012 Document Reviewed: 04/09/2011  Elsevier Interactive Patient Education 2016 [employer]vier Inc[employer]

## 2015-05-25 NOTE — ED Provider Notes (Signed)
[facility] Emergency Department Provider Note  ____________________________________________  Time seen: 3:00 PM  I have reviewed the triage vital signs and the nursing notes.   HISTORY  Chief Complaint Vaginal Bleeding    HPI Colleen Chapman is a 20 y.o. female who complains of vaginal bleeding that started this morning when she woke up her she is also having cramping pelvic pain that feels like a period. The bleeding is heavy like a normal period. Her last period was about a month ago. No abnormal vaginal bleeding. No dysuria frequency urgency.     Past Medical History  Diagnosis Date  . ADHD (attention deficit hyperactivity disorder)     ongoing     Patient Active Problem List   Diagnosis Date Noted  . Postpartum care following vaginal delivery 07/31/2014  . Irregular contractions 07/30/2014  . Labor and delivery, indication for care 07/26/2014  . Indication for care in labor or delivery 07/22/2014     History reviewed. No pertinent past surgical history.   Current Outpatient Rx  Name  Route  Sig  Dispense  Refill  . docusate sodium (COLACE) 100 MG capsule   Oral   Take 1 capsule (100 mg total) by mouth 2 (two) times daily as needed for mild constipation.   60 capsule   1   . ibuprofen (ADVIL) 200 MG tablet   Oral   Take 1 tablet (200 mg total) by mouth every 6 (six) hours as needed.   60 tablet   1   . metoCLOPramide (REGLAN) 10 MG tablet   Oral   Take 1 tablet (10 mg total) by mouth every 6 (six) hours as needed.   12 tablet   0   . oxyCODONE-acetaminophen (PERCOCET/ROXICET) 5-325 MG per tablet   Oral   Take 1 tablet by mouth every 4 (four) hours as needed (for pain scale 4-7).   15 tablet   0   . Prenatal Vit-Fe Fumarate-FA (PRENATAL MULTIVITAMIN) TABS tablet   Oral   Take 1 tablet by mouth daily at 12 noon.         . ranitidine (ZANTAC) 75 MG tablet   Oral   Take 1 tablet (75 mg total) by mouth 2 (two) times  daily.   30 tablet   0      Allergies Review of patient's allergies indicates no known allergies.   Family History  Problem Relation Age of Onset  . Family history unknown: Yes    Social History Social History  Substance Use Topics  . Smoking status: Never Smoker   . Smokeless tobacco: None  . Alcohol Use: No    Review of Systems  Constitutional:   No fever or chills. No weight changes Eyes:   No vision changes.  ENT:   No sore throat. No rhinorrhea. Cardiovascular:   No chest pain. Respiratory:   No dyspnea or cough. Gastrointestinal:   Negative for abdominal pain, vomiting and diarrhea.  No BRBPR or melena. Genitourinary:   Negative for dysuria or difficulty urinating. Musculoskeletal:   Negative for focal pain or swelling Skin:   Negative for rash. Neurological:   Negative for headaches, focal weakness or numbness.  10-point ROS otherwise negative.  ____________________________________________   PHYSICAL EXAM:  VITAL SIGNS: ED Triage Vitals  Enc Vitals Group     BP 05/25/15 1322 134/72 mmHg     Pulse Rate 05/25/15 1322 70     Resp 05/25/15 1322 20     Temp 05/25/15 1322  98.1 F (36.7 C)     Temp Source 05/25/15 1322 Oral     SpO2 05/25/15 1322 99 %     Weight 05/25/15 1322 133 lb (60.328 kg)     Height 05/25/15 1322 5\' 6"  (1.676 m)     Head Cir --      Peak Flow --      Pain Score 05/25/15 1323 5     Pain Loc --      Pain Edu? --      Excl. in GC? --     Vital signs reviewed, nursing assessments reviewed.   Constitutional:   Alert and oriented. Well appearing and in no distress. Eyes:   No scleral icterus. No conjunctival pallor. PERRL. EOMI ENT   Head:   Normocephalic and atraumatic.   Nose:   No congestion/rhinnorhea. No septal hematoma   Mouth/Throat:   MMM, no pharyngeal erythema. No peritonsillar mass.    Neck:   No stridor. No SubQ emphysema. No meningismus. Hematological/Lymphatic/Immunilogical:   No cervical  lymphadenopathy. Cardiovascular:   RRR. Symmetric bilateral radial and DP pulses.  No murmurs.  Respiratory:   Normal respiratory effort without tachypnea nor retractions. Breath sounds are clear and equal bilaterally. No wheezes/rales/rhonchi. Gastrointestinal:   Soft and nontender. Non distended. There is no CVA tenderness.  No rebound, rigidity, or guarding. Genitourinary:   deferred Musculoskeletal:   Nontender with normal range of motion in all extremities. No joint effusions.  No lower extremity tenderness.  No edema. Neurologic:   Normal speech and language.  CN 2-10 normal. Motor grossly intact. No gross focal neurologic deficits are appreciated.  Skin:    Skin is warm, dry and intact. No rash noted.  No petechiae, purpura, or bullae. ____________________________________________    LABS (pertinent positives/negatives) (all labs ordered are listed, but only abnormal results are displayed) Labs Reviewed  HCG, QUANTITATIVE, PREGNANCY - Abnormal; Notable for the following:    hCG, Beta Chain, Quant, S 17 (*)    All other components within normal limits  URINALYSIS COMPLETEWITH MICROSCOPIC ([facility] ONLY) - Abnormal; Notable for the following:    Color, Urine AMBER (*)    APPearance CLOUDY (*)    Hgb urine dipstick 3+ (*)    Protein, ur 100 (*)    Leukocytes, UA TRACE (*)    Bacteria, UA RARE (*)    Squamous Epithelial / LPF 0-5 (*)    All other components within normal limits  POC URINE PREG, ED   ____________________________________________   EKG    ____________________________________________    RADIOLOGY  Ultrasound pelvis unremarkable. No adnexal masses, no free fluid, no intrauterine findings.  ____________________________________________   PROCEDURES   ____________________________________________   INITIAL IMPRESSION / ASSESSMENT AND PLAN / ED COURSE  Pertinent labs & imaging results that were available during my care of the patient were reviewed by me  and considered in my medical decision making (see chart for details).  Patient presents with pelvic cramps and vaginal bleeding in first trimester pregnancy. Beta Quant is 17. Record show that her blood type is B+. Ultrasound negative for intrauterine or ectopic pregnancy. A stone symptoms and how early this likely is just too early to tell but she is probably having a miscarriage. I counseled her on this. Also counseled her to follow up with the health department for ongoing care and to follow up with primary care. Also encouraged her to have a repeat beta hCG level drawn in 3 days.     ____________________________________________  FINAL CLINICAL IMPRESSION(S) / ED DIAGNOSES  Final diagnoses:  Spontaneous miscarriage   vaginal bleeding in first trimester pregnancy    Sharman Cheek, MD 05/25/15 1745

## 2015-06-28 ENCOUNTER — Other Ambulatory Visit: Payer: Self-pay | Admitting: Family Medicine

## 2015-06-28 DIAGNOSIS — O200 Threatened abortion: Secondary | ICD-10-CM

## 2015-07-03 ENCOUNTER — Ambulatory Visit
Admission: RE | Admit: 2015-07-03 | Discharge: 2015-07-03 | Disposition: A | Discharge status: Home / Self Care | Payer: Medicaid Other | Source: Ambulatory Visit | Attending: Family Medicine | Admitting: Family Medicine

## 2015-07-03 DIAGNOSIS — O208 Other hemorrhage in early pregnancy: Secondary | ICD-10-CM | POA: Diagnosis not present

## 2015-07-03 DIAGNOSIS — O200 Threatened abortion: Secondary | ICD-10-CM | POA: Diagnosis present

## 2015-07-03 DIAGNOSIS — Z3A01 Less than 8 weeks gestation of pregnancy: Secondary | ICD-10-CM | POA: Diagnosis not present

## 2015-08-17 ENCOUNTER — Other Ambulatory Visit: Payer: Self-pay | Admitting: Family Medicine

## 2015-08-17 DIAGNOSIS — Z3401 Encounter for supervision of normal first pregnancy, first trimester: Secondary | ICD-10-CM

## 2015-08-18 ENCOUNTER — Ambulatory Visit
Admission: RE | Admit: 2015-08-18 | Discharge: 2015-08-18 | Disposition: A | Discharge status: Home / Self Care | Payer: Medicaid Other | Source: Ambulatory Visit | Attending: Family Medicine | Admitting: Family Medicine

## 2015-08-18 DIAGNOSIS — Z3A12 12 weeks gestation of pregnancy: Secondary | ICD-10-CM | POA: Insufficient documentation

## 2015-08-18 DIAGNOSIS — O200 Threatened abortion: Secondary | ICD-10-CM | POA: Diagnosis not present

## 2015-08-18 DIAGNOSIS — Z3401 Encounter for supervision of normal first pregnancy, first trimester: Secondary | ICD-10-CM

## 2015-08-18 DIAGNOSIS — O0992 Supervision of high risk pregnancy, unspecified, second trimester: Secondary | ICD-10-CM | POA: Insufficient documentation

## 2015-09-21 ENCOUNTER — Other Ambulatory Visit: Payer: Self-pay | Admitting: Advanced Practice Midwife

## 2015-09-21 DIAGNOSIS — Z3482 Encounter for supervision of other normal pregnancy, second trimester: Secondary | ICD-10-CM

## 2015-10-10 ENCOUNTER — Ambulatory Visit
Admission: RE | Admit: 2015-10-10 | Discharge: 2015-10-10 | Disposition: A | Discharge status: Home / Self Care | Payer: Medicaid Other | Source: Ambulatory Visit | Attending: Advanced Practice Midwife | Admitting: Advanced Practice Midwife

## 2015-10-10 DIAGNOSIS — Z3A19 19 weeks gestation of pregnancy: Secondary | ICD-10-CM | POA: Diagnosis not present

## 2015-10-10 DIAGNOSIS — Z3482 Encounter for supervision of other normal pregnancy, second trimester: Secondary | ICD-10-CM | POA: Insufficient documentation

## 2016-02-09 ENCOUNTER — Encounter: Payer: Self-pay | Admitting: *Deleted

## 2016-02-09 ENCOUNTER — Inpatient Hospital Stay
Admission: EM | Admit: 2016-02-09 | Discharge: 2016-02-09 | Disposition: A | Discharge status: Home / Self Care | Payer: Medicaid Other | Attending: Obstetrics and Gynecology | Admitting: Obstetrics and Gynecology

## 2016-02-09 DIAGNOSIS — Z3A37 37 weeks gestation of pregnancy: Secondary | ICD-10-CM | POA: Insufficient documentation

## 2016-02-09 DIAGNOSIS — F909 Attention-deficit hyperactivity disorder, unspecified type: Secondary | ICD-10-CM | POA: Diagnosis not present

## 2016-02-09 DIAGNOSIS — O368130 Decreased fetal movements, third trimester, not applicable or unspecified: Secondary | ICD-10-CM | POA: Diagnosis not present

## 2016-02-09 DIAGNOSIS — O99343 Other mental disorders complicating pregnancy, third trimester: Secondary | ICD-10-CM | POA: Insufficient documentation

## 2016-02-09 NOTE — Discharge Summary (Signed)
Patient discharged home, discharge instructions given, patient states understanding. Patient left floor in stable condition, denies any other needs at this time. Patient to keep next scheduled OB appointment 

## 2016-02-09 NOTE — Discharge Instructions (Signed)
Braxton Hicks Contractions Contractions of the uterus can occur throughout pregnancy. Contractions are not always a sign that you are in labor.  WHAT ARE BRAXTON HICKS CONTRACTIONS?  Contractions that occur before labor are called Braxton Hicks contractions, or false labor. Toward the end of pregnancy (32-34 weeks), these contractions can develop more often and may become more forceful. This is not true labor because these contractions do not result in opening (dilatation) and thinning of the cervix. They are sometimes difficult to tell apart from true labor because these contractions can be forceful and people have different pain tolerances. You should not feel embarrassed if you go to the hospital with false labor. Sometimes, the only way to tell if you are in true labor is for your health care provider to look for changes in the cervix. If there are no prenatal problems or other health problems associated with the pregnancy, it is completely safe to be sent home with false labor and await the onset of true labor. HOW CAN YOU TELL THE DIFFERENCE BETWEEN TRUE AND FALSE LABOR? False Labor   The contractions of false labor are usually shorter and not as hard as those of true labor.   The contractions are usually irregular.   The contractions are often felt in the front of the lower abdomen and in the groin.   The contractions may go away when you walk around or change positions while lying down.   The contractions get weaker and are shorter lasting as time goes on.   The contractions do not usually become progressively stronger, regular, and closer together as with true labor.  True Labor   Contractions in true labor last 30-70 seconds, become very regular, usually become more intense, and increase in frequency.   The contractions do not go away with walking.   The discomfort is usually felt in the top of the uterus and spreads to the lower abdomen and low back.   True labor can be  determined by your health care provider with an exam. This will show that the cervix is dilating and getting thinner.  WHAT TO REMEMBER  Keep up with your usual exercises and follow other instructions given by your health care provider.   Take medicines as directed by your health care provider.   Keep your regular prenatal appointments.   Eat and drink lightly if you think you are going into labor.   If Braxton Hicks contractions are making you uncomfortable:   Change your position from lying down or resting to walking, or from walking to resting.   Sit and rest in a tub of warm water.   Drink 2-3 glasses of water. Dehydration may cause these contractions.   Do slow and deep breathing several times an hour.  WHEN SHOULD I SEEK IMMEDIATE MEDICAL CARE? Seek immediate medical care if:  Your contractions become stronger, more regular, and closer together.   You have fluid leaking or gushing from your vagina.   You have a fever.   You pass blood-tinged mucus.   You have vaginal bleeding.   You have continuous abdominal pain.   You have low back pain that you never had before.   You feel your baby's head pushing down and causing pelvic pressure.   Your baby is not moving as much as it used to.  This information is not intended to replace advice given to you by your health care provider. Make sure you discuss any questions you have with your health care   provider. Document Released: 02/18/2005 Document Revised: 06/12/2015 Document Reviewed: 11/30/2012 Elsevier Interactive Patient Education  2017 [employer] Inc. Introduction Patient Name: ________________________________________________ Patient Due Date: ____________________ What is a fetal movement count? A fetal movement count is the number of times that you feel your baby move during a certain amount of time. This may also be called a fetal kick count. A fetal movement count is recommended for every pregnant  woman. You may be asked to start counting fetal movements as early as week 28 of your pregnancy. Pay attention to when your baby is most active. You may notice your baby's sleep and wake cycles. You may also notice things that make your baby move more. You should do a fetal movement count:  When your baby is normally most active.  At the same time each day. A good time to count movements is while you are resting, after having something to eat and drink. How do I count fetal movements? 1. Find a quiet, comfortable area. Sit, or lie down on your side. 2. Write down the date, the start time and stop time, and the number of movements that you felt between those two times. Take this information with you to your health care visits. 3. For 2 hours, count kicks, flutters, swishes, rolls, and jabs. You should feel at least 10 movements during 2 hours. 4. You may stop counting after you have felt 10 movements. 5. If you do not feel 10 movements in 2 hours, have something to eat and drink. Then, keep resting and counting for 1 hour. If you feel at least 4 movements during that hour, you may stop counting. Contact a health care provider if:  You feel fewer than 4 movements in 2 hours.  Your baby is not moving like he or she usually does. Date: ____________ Start time: ____________ Stop time: ____________ Movements: ____________ Date: ____________ Start time: ____________ Stop time: ____________ Movements: ____________ Date: ____________ Start time: ____________ Stop time: ____________ Movements: ____________ Date: ____________ Start time: ____________ Stop time: ____________ Movements: ____________ Date: ____________ Start time: ____________ Stop time: ____________ Movements: ____________ Date: ____________ Start time: ____________ Stop time: ____________ Movements: ____________ Date: ____________ Start time: ____________ Stop time: ____________ Movements: ____________ Date: ____________ Start time:  ____________ Stop time: ____________ Movements: ____________ Date: ____________ Start time: ____________ Stop time: ____________ Movements: ____________ This information is not intended to replace advice given to you by your health care provider. Make sure you discuss any questions you have with your health care provider. Document Released: 03/20/2006 Document Revised: 10/18/2015 Document Reviewed: 03/30/2015 Elsevier Interactive Patient Education  2017 [employer] Inc.  

## 2016-02-09 NOTE — OB Triage Note (Signed)
Patient states she was at [facility] and was sent over for decreased fetal movement and preeclampsia evaluation.

## 2016-02-09 NOTE — Discharge Summary (Signed)
Obstetric Discharge Summary Reason for Admission: decreased FM Prenatal Procedures: NST and ultrasound Intrapartum Procedures: NA Postpartum Procedures: NA Complications-Operative and Postpartum: NA Hemoglobin  Date Value Ref Range Status  04/17/2015 15.2 12.0 - 16.0 g/dL Final   HCT  Date Value Ref Range Status  04/17/2015 45.7 35.0 - 47.0 % Final    Physical Exam:  General: alert, cooperative and appears stated age Abd: Gravid Discharge Diagnoses: Term pregnancy with decreased fetal movement  Discharge Information: Date: 02/09/2016 Activity: pelvic rest Diet: routine Medications: PNV Condition: stable Instructions: Per directions Discharge ZO:XWRUto:home Follow-up Information    [facility] on 02/13/2016.   Contact information: [address] [place] [address] [place] [id number] [phone]           NST reactive with 2 accels 15 x 15 BPM.   Sharee Pimplearon W Jones 02/09/2016, 5:10 PM

## 2016-02-09 NOTE — H&P (Signed)
Colleen Chapman is a 20 y.o. female presenting for decreased fetal movement from [facility] significant with LMP of 05/25/15 & EDD of 01/30/16. US at 17 5/7 weeks with EDD of 02/29/16 for official due date. NO ROM, no VB, no HA, blurred vision or any other problems.  OB History    Gravida Para Term Preterm AB Living   2 1 1 0 0 1   SAB TAB Ectopic Multiple Live Births   0 0 0 0 1     Past Medical History:  Diagnosis Date  . ADHD (attention deficit hyperactivity disorder)    ongoing   Past Surgical History:  Procedure Laterality Date  . NO PAST SURGERIES     Family History: Family history is unknown by patient. Social History:  reports that she has never smoked. She has never used smokeless tobacco. She reports that she does not drink alcohol or use drugs.     Maternal Diabetes: 1 h GCT 83 Genetic Screening:AFP neg Maternal Ultrasounds/Referrals: Normal Fetal Ultrasounds or other Referrals:  None Maternal Substance Abuse:  No Significant Maternal Medications:  None Significant Maternal Lab Results:  GBs neg  Other Comments:  BP are normal here in [facility].  Review of Systems  Constitutional: Negative.   HENT: Negative.   Eyes: Negative.   Respiratory: Negative.   Cardiovascular: Negative.   Gastrointestinal: Negative.   Genitourinary: Negative.   Musculoskeletal: Negative.   Skin: Negative.   Neurological: Negative.   Endo/Heme/Allergies: Negative.   Psychiatric/Behavioral: Negative.    History   Blood pressure 111/81, pulse 84, temperature 98.2 F (36.8 C), temperature source Oral, resp. rate 18, last menstrual period 03/21/2015, unknown if currently breastfeeding. Exam Cx not evaluated here but, at office Physical Exam  Gen: 20 yo female in NAD. Heart: S1S2, RRR, No M/R/G. Abd:Gravid, EFW 7#3oz Resp: reg and non-labored Prenatal labs: ABO, Rh:  B os  Antibody:   Rubella:  MMR x2 RPR:   neg HBsAg:   neg HIV:   Neg  GBS:   neg AFP:  neg Assessment/Plan: A: IUP at 37 1/7 weeks 2. Decreased FM 3. 24 h urine for P:C ratio: 180 02/05/16 P: DC home 2. NST reactive with 2 accels 15 x 15 BPM. 3. FKC's. 4. FU with [facility] as scheduled.     W  02/09/2016, 4:56 PM    

## 2016-02-13 ENCOUNTER — Inpatient Hospital Stay
Admission: EM | Admit: 2016-02-13 | Discharge: 2016-02-13 | Disposition: A | Discharge status: Home / Self Care | Payer: Medicaid Other | Attending: Obstetrics and Gynecology | Admitting: Obstetrics and Gynecology

## 2016-02-13 DIAGNOSIS — Z3A37 37 weeks gestation of pregnancy: Secondary | ICD-10-CM | POA: Diagnosis present

## 2016-02-13 DIAGNOSIS — O1203 Gestational edema, third trimester: Secondary | ICD-10-CM | POA: Diagnosis not present

## 2016-02-13 LAB — COMPREHENSIVE METABOLIC PANEL
ALBUMIN: 2.5 g/dL — AB (ref 3.5–5.0)
ALK PHOS: 137 U/L — AB (ref 38–126)
ALT: 16 U/L (ref 14–54)
AST: 24 U/L (ref 15–41)
Anion gap: 8 (ref 5–15)
BILIRUBIN TOTAL: 0.8 mg/dL (ref 0.3–1.2)
BUN: 8 mg/dL (ref 6–20)
CALCIUM: 9 mg/dL (ref 8.9–10.3)
CO2: 21 mmol/L — ABNORMAL LOW (ref 22–32)
CREATININE: 0.54 mg/dL (ref 0.44–1.00)
Chloride: 105 mmol/L (ref 101–111)
GFR calc Af Amer: >60 mL/min (ref >60)
GLUCOSE: 86 mg/dL (ref 65–99)
Potassium: 4 mmol/L (ref 3.5–5.1)
Sodium: 134 mmol/L — ABNORMAL LOW (ref 135–145)
TOTAL PROTEIN: 6.2 g/dL — AB (ref 6.5–8.1)

## 2016-02-13 LAB — CBC WITH DIFFERENTIAL/PLATELET
BASOS ABS: 0 10*3/uL (ref 0–0.1)
BASOS PCT: 0 %
Eosinophils Absolute: 0 10*3/uL (ref 0–0.7)
Eosinophils Relative: 0 %
HEMATOCRIT: 39.8 % (ref 35.0–47.0)
HEMOGLOBIN: 13.7 g/dL (ref 12.0–16.0)
LYMPHS PCT: 13 %
Lymphs Abs: 1.5 10*3/uL (ref 1.0–3.6)
MCH: 31.7 pg (ref 26.0–34.0)
MCHC: 34.5 g/dL (ref 32.0–36.0)
MCV: 91.8 fL (ref 80.0–100.0)
MONO ABS: 0.9 10*3/uL (ref 0.2–0.9)
Monocytes Relative: 7 %
NEUTROS ABS: 9.6 10*3/uL — AB (ref 1.4–6.5)
NEUTROS PCT: 80 %
Platelets: 226 10*3/uL (ref 150–440)
RBC: 4.34 MIL/uL (ref 3.80–5.20)
RDW: 13 % (ref 11.5–14.5)
WBC: 12 10*3/uL — ABNORMAL HIGH (ref 3.6–11.0)

## 2016-02-13 LAB — PROTEIN / CREATININE RATIO, URINE
Creatinine, Urine: 72 mg/dL
PROTEIN CREATININE RATIO: 0.14 mg/mg{creat} (ref 0.00–0.15)
Total Protein, Urine: 10 mg/dL

## 2016-02-13 LAB — URIC ACID: URIC ACID, SERUM: 4.7 mg/dL (ref 2.3–6.6)

## 2016-02-13 NOTE — Discharge Instructions (Signed)
Preeclampsia and Eclampsia  Preeclampsia is a serious condition that develops only during pregnancy. It is also called toxemia of pregnancy. This condition causes high blood pressure along with other symptoms, such as swelling and headaches. These symptoms may develop as the condition gets worse. Preeclampsia may occur at 20 weeks of pregnancy or later.  Diagnosing and treating preeclampsia early is very important. If not treated early, it can cause serious problems for you and your baby. One problem it can lead to is eclampsia, which is a condition that causes muscle jerking or shaking (convulsions or seizures) in the mother. Delivering your baby is the best treatment for preeclampsia or eclampsia. Preeclampsia and eclampsia symptoms usually go away after your baby is born.  What are the causes?  The cause of preeclampsia is not known.  What increases the risk?  The following risk factors make you more likely to develop preeclampsia:  · Being pregnant for the first time.  · Having had preeclampsia during a past pregnancy.  · Having a family history of preeclampsia.  · Having high blood pressure.  · Being pregnant with twins or triplets.  · Being 35 or older.  · Being African-American.  · Having kidney disease or diabetes.  · Having medical conditions such as lupus or blood diseases.  · Being very overweight (obese).    What are the signs or symptoms?  The earliest signs of preeclampsia are:  · High blood pressure.  · Increased protein in your urine. Your health care provider will check for this at every visit before you give birth (prenatal visit).    Other symptoms that may develop as the condition gets worse include:  · Severe headaches.  · Sudden weight gain.  · Swelling of the hands, face, legs, and feet.  · Nausea and vomiting.  · Vision problems, such as blurred or double vision.  · Numbness in the face, arms, legs, and feet.  · Urinating less than usual.  · Dizziness.  · Slurred speech.  · Abdominal pain,  especially upper abdominal pain.  · Convulsions or seizures.    Symptoms generally go away after giving birth.  How is this diagnosed?  There are no screening tests for preeclampsia. Your health care provider will ask you about symptoms and check for signs of preeclampsia during your prenatal visits. You may also have tests that include:  · Urine tests.  · Blood tests.  · Checking your blood pressure.  · Monitoring your baby’s heart rate.  · Ultrasound.    How is this treated?  You and your health care provider will determine the treatment approach that is best for you. Treatment may include:  · Having more frequent prenatal exams to check for signs of preeclampsia, if you have an increased risk for preeclampsia.  · Bed rest.  · Reducing how much salt (sodium) you eat.  · Medicine to lower your blood pressure.  · Staying in the hospital, if your condition is severe. There, treatment will focus on controlling your blood pressure and the amount of fluids in your body (fluid retention).  · You may need to take medicine (magnesium sulfate) to prevent seizures. This medicine may be given as an injection or through an IV tube.  · Delivering your baby early, if your condition gets worse. You may have your labor started with medicine (induced), or you may have a cesarean delivery.    Follow these instructions at home:  Eating and drinking     ·   quitting, ask your health care provider.  Do not use alcohol or drugs.  Avoid stress as much as possible. Rest and get plenty of sleep. General instructions  Take over-the-counter and prescription medicines only as told by your health  care provider.  When lying down, lie on your side. This keeps pressure off of your baby.  When sitting or lying down, raise (elevate) your feet. Try putting some pillows underneath your lower legs.  Exercise regularly. Ask your health care provider what kinds of exercise are best for you.  Keep all follow-up and prenatal visits as told by your health care provider. This is important. How is this prevented? To prevent preeclampsia or eclampsia from developing during another pregnancy:  Get proper medical care during pregnancy. Your health care provider may be able to prevent preeclampsia or diagnose and treat it early.  Your health care provider may have you take a low-dose aspirin or a calcium supplement during your next pregnancy.  You may have tests of your blood pressure and kidney function after giving birth.  Maintain a healthy weight. Ask your health care provider for help managing weight gain during pregnancy.  Work with your health care provider to manage any long-term (chronic) health conditions you have, such as diabetes or kidney problems. Contact a health care provider if:  You gain more weight than expected.  You have headaches.  You have nausea or vomiting.  You have abdominal pain.  You feel dizzy or light-headed. Get help right away if:  You develop sudden or severe swelling anywhere in your body. This usually happens in the legs.  You gain 5 lbs (2.3 kg) or more during one week.  You have severe:  Abdominal pain.  Headaches.  Dizziness.  Vision problems.  Confusion.  Nausea or vomiting.  You have a seizure.  You have trouble moving any part of your body.  You develop numbness in any part of your body.  You have trouble speaking.  You have any abnormal bleeding.  You pass out. This information is not intended to replace advice given to you by your health care provider. Make sure you discuss any questions you have with your health care  provider. Document Released: 02/16/2000 Document Revised: 10/17/2015 Document Reviewed: 09/25/2015 Elsevier Interactive Patient Education  2017 [employer]   Call [facility] to make an appointment for Friday You may use over-the-counter Mucinex for congestion

## 2016-02-13 NOTE — Progress Notes (Signed)
Triage visit for NST   Colleen LanesKrista Aura DialsGordon Chapman is a 20 y.o. G2P1001. She is at 7585w5d gestation by unsure LMP of 03/21/15, confirmed with US and EDD of 02/29/16.  She was sent by Dr. Alvester MorinNewton with [facility] to rule out pre-eclampsia with elevated BP 140/80s, c/o of a 6# weight gain in 1 week, pitting bilateral edema, facial swelling, and seeing 1 spot this morning.  Pt. States she has not seen further visual disturbances.  She does report a HA, but also reports sinus congestion and has not eaten since this morning.  She told the nurse she was having epigastric pain, but pointed to her right breast and states it was a sharp, shooting pain.  She endorses good fetal movement, and denies vaginal bleeding, LOF, abnormal discharge.  She also reports cough/cold symptoms and sore for 2 weeks and occasionally coughing up blood. She has been taking Tylenol and cough drops only.   O:  BP 121/64   Pulse 90   Temp 98.1 F (36.7 C) (Oral)   Resp 18   Ht 5\' 6"  (1.676 m)   Wt 85.3 kg (188 lb)   LMP 03/21/2015   BMI 30.34 kg/m    Today's Vitals   02/13/16 1514 02/13/16 1529 02/13/16 1544 02/13/16 1559  BP: 124/73 124/79 117/64 121/64  Pulse: 91 (!) 101 83 90  Resp:      Temp:      TempSrc:      Weight:      Height:        Results for orders placed or performed during the hospital encounter of 02/13/16 (from the past 48 hour(s))  CBC with Differential/Platelet   Collection Time: 02/13/16  2:25 PM  Result Value Ref Range   WBC 12.0 (H) 3.6 - 11.0 K/uL   RBC 4.34 3.80 - 5.20 MIL/uL   Hemoglobin 13.7 12.0 - 16.0 g/dL   HCT 16.139.8 09.635.0 - 04.547.0 %   MCV 91.8 80.0 - 100.0 fL   MCH 31.7 26.0 - 34.0 pg   MCHC 34.5 32.0 - 36.0 g/dL   RDW 40.913.0 81.111.5 - 91.414.5 %   Platelets 226 150 - 440 K/uL   Neutrophils Relative % 80 %   Neutro Abs 9.6 (H) 1.4 - 6.5 K/uL   Lymphocytes Relative 13 %   Lymphs Abs 1.5 1.0 - 3.6 K/uL   Monocytes Relative 7 %   Monocytes Absolute 0.9 0.2 - 0.9 K/uL   Eosinophils Relative 0 %   Eosinophils Absolute 0.0 0 - 0.7 K/uL   Basophils Relative 0 %   Basophils Absolute 0.0 0 - 0.1 K/uL  Comprehensive metabolic panel   Collection Time: 02/13/16  2:25 PM  Result Value Ref Range   Sodium 134 (L) 135 - 145 mmol/L   Potassium 4.0 3.5 - 5.1 mmol/L   Chloride 105 101 - 111 mmol/L   CO2 21 (L) 22 - 32 mmol/L   Glucose, Bld 86 65 - 99 mg/dL   BUN 8 6 - 20 mg/dL   Creatinine, Ser 7.820.54 0.44 - 1.00 mg/dL   Calcium 9.0 8.9 - 95.610.3 mg/dL   Total Protein 6.2 (L) 6.5 - 8.1 g/dL   Albumin 2.5 (L) 3.5 - 5.0 g/dL   AST 24 15 - 41 U/L   ALT 16 14 - 54 U/L   Alkaline Phosphatase 137 (H) 38 - 126 U/L   Total Bilirubin 0.8 0.3 - 1.2 mg/dL   GFR calc non Af Amer >60 >60 mL/min  GFR calc Af Amer >60 >60 mL/min   Anion gap 8 5 - 15  Uric acid   Collection Time: 02/13/16  2:25 PM  Result Value Ref Range   Uric Acid, Serum 4.7 2.3 - 6.6 mg/dL  Protein / creatinine ratio, urine   Collection Time: 02/13/16  2:53 PM  Result Value Ref Range   Creatinine, Urine 72 mg/dL   Total Protein, Urine 10 mg/dL   Protein Creatinine Ratio 0.14 0.00 - 0.15 mg/mg[Cre]     Gen: NAD, AAOx3  Lungs: clear, equal bilaterally Heart: S1, S2, RRR     Abd: FNTTP      Ext: Non-tender, Non-pitting bilateral lower extremity edema, +2 DTRs, no clonus    FHT: Baseline: 150 bpm/ moderate variability/ +accels/ no decels  TOCO: occasional SVE:  deferred   A/P:  20 y.o. G2P1001 2750w5d without evidence of gestational hypertension or pre-eclampsia.     Reactive NST, with moderate variability and accelerations, no decels  Fetal Wellbeing: Reassuring  Strict fetal kick counts  Pre-eclampsia s/s reviewed   Category 1 fetal tracing  Labor precautions given   D/c home stable  Advised to increase hydration, elevate feet, wear compression stockings, eat small frequent meals   Advised to use Mucinex PRN for cough/congestion/ chloraseptic spray PRN for sore throat/ Tylenol PRN for headaches  F/U at [facility] on  Friday for repeat BP   Dr. Feliberto GottronSchermerhorn aware and agrees with plan of care  Carlean JewsMeredith Treyveon Mochizuki, CNM

## 2016-02-13 NOTE — Final Progress Note (Signed)
 Triage visit for NST   Colleen Chapman is a 20 y.o. G2P1001. She is at [redacted]w[redacted]d gestation by unsure LMP of 03/21/15, confirmed with US and EDD of 02/29/16.  She was sent by Dr. Newton with [facility] to rule out pre-eclampsia with elevated BP 140/80s, c/o of a 6# weight gain in 1 week, pitting bilateral edema, facial swelling, and seeing 1 spot this morning.  Pt. States she has not seen further visual disturbances.  She does report a HA, but also reports sinus congestion and has not eaten since this morning.  She told the nurse she was having epigastric pain, but pointed to her right breast and states it was a sharp, shooting pain.  She endorses good fetal movement, and denies vaginal bleeding, LOF, abnormal discharge.  She also reports cough/cold symptoms and sore for 2 weeks and occasionally coughing up blood. She has been taking Tylenol and cough drops only.   O:  BP 121/64   Pulse 90   Temp 98.1 F (36.7 C) (Oral)   Resp 18   Ht 5' 6" (1.676 m)   Wt 85.3 kg (188 lb)   LMP 03/21/2015   BMI 30.34 kg/m    Today's Vitals   02/13/16 1514 02/13/16 1529 02/13/16 1544 02/13/16 1559  BP: 124/73 124/79 117/64 121/64  Pulse: 91 (!) 101 83 90  Resp:      Temp:      TempSrc:      Weight:      Height:        Results for orders placed or performed during the hospital encounter of 02/13/16 (from the past 48 hour(s))  CBC with Differential/Platelet   Collection Time: 02/13/16  2:25 PM  Result Value Ref Range   WBC 12.0 (H) 3.6 - 11.0 K/uL   RBC 4.34 3.80 - 5.20 MIL/uL   Hemoglobin 13.7 12.0 - 16.0 g/dL   HCT 39.8 35.0 - 47.0 %   MCV 91.8 80.0 - 100.0 fL   MCH 31.7 26.0 - 34.0 pg   MCHC 34.5 32.0 - 36.0 g/dL   RDW 13.0 11.5 - 14.5 %   Platelets 226 150 - 440 K/uL   Neutrophils Relative % 80 %   Neutro Abs 9.6 (H) 1.4 - 6.5 K/uL   Lymphocytes Relative 13 %   Lymphs Abs 1.5 1.0 - 3.6 K/uL   Monocytes Relative 7 %   Monocytes Absolute 0.9 0.2 - 0.9 K/uL   Eosinophils Relative 0 %   Eosinophils Absolute 0.0 0 - 0.7 K/uL   Basophils Relative 0 %   Basophils Absolute 0.0 0 - 0.1 K/uL  Comprehensive metabolic panel   Collection Time: 02/13/16  2:25 PM  Result Value Ref Range   Sodium 134 (L) 135 - 145 mmol/L   Potassium 4.0 3.5 - 5.1 mmol/L   Chloride 105 101 - 111 mmol/L   CO2 21 (L) 22 - 32 mmol/L   Glucose, Bld 86 65 - 99 mg/dL   BUN 8 6 - 20 mg/dL   Creatinine, Ser 0.54 0.44 - 1.00 mg/dL   Calcium 9.0 8.9 - 10.3 mg/dL   Total Protein 6.2 (L) 6.5 - 8.1 g/dL   Albumin 2.5 (L) 3.5 - 5.0 g/dL   AST 24 15 - 41 U/L   ALT 16 14 - 54 U/L   Alkaline Phosphatase 137 (H) 38 - 126 U/L   Total Bilirubin 0.8 0.3 - 1.2 mg/dL   GFR calc non Af Amer >60 >60 mL/min     GFR calc Af Amer >60 >60 mL/min   Anion gap 8 5 - 15  Uric acid   Collection Time: 02/13/16  2:25 PM  Result Value Ref Range   Uric Acid, Serum 4.7 2.3 - 6.6 mg/dL  Protein / creatinine ratio, urine   Collection Time: 02/13/16  2:53 PM  Result Value Ref Range   Creatinine, Urine 72 mg/dL   Total Protein, Urine 10 mg/dL   Protein Creatinine Ratio 0.14 0.00 - 0.15 mg/mg[Cre]     Gen: NAD, AAOx3  Lungs: clear, equal bilaterally Heart: S1, S2, RRR     Abd: FNTTP      Ext: Non-tender, Non-pitting bilateral lower extremity edema, +2 DTRs, no clonus    FHT: Baseline: 150 bpm/ moderate variability/ +accels/ no decels  TOCO: occasional SVE:  deferred   A/P:  20 y.o. G2P1001 [redacted]w[redacted]d without evidence of gestational hypertension or pre-eclampsia.     Reactive NST, with moderate variability and accelerations, no decels  Fetal Wellbeing: Reassuring  Strict fetal kick counts  Pre-eclampsia s/s reviewed   Category 1 fetal tracing  Labor precautions given   D/c home stable  Advised to increase hydration, elevate feet, wear compression stockings, eat small frequent meals   Advised to use Mucinex PRN for cough/congestion/ chloraseptic spray PRN for sore throat/ Tylenol PRN for headaches  F/U at [facility] on  Friday for repeat BP   Dr. Schermerhorn aware and agrees with plan of care  Colleen Chapman, CNM   

## 2016-02-13 NOTE — OB Triage Note (Signed)
Ms. Colleen DialsGordon Dupre here with elevated BP, swelling and weigh gain. Reports positive fetal movement, denies headache, epigastric pain, sees occasional floaters.

## 2016-02-20 ENCOUNTER — Inpatient Hospital Stay
Admission: EM | Admit: 2016-02-20 | Discharge: 2016-02-22 | DRG: 775 | Disposition: A | Discharge status: Home / Self Care | Payer: Medicaid Other | Attending: Obstetrics and Gynecology | Admitting: Obstetrics and Gynecology

## 2016-02-20 ENCOUNTER — Inpatient Hospital Stay: Payer: Medicaid Other | Admitting: Registered Nurse

## 2016-02-20 DIAGNOSIS — O26893 Other specified pregnancy related conditions, third trimester: Secondary | ICD-10-CM | POA: Diagnosis present

## 2016-02-20 DIAGNOSIS — O99344 Other mental disorders complicating childbirth: Secondary | ICD-10-CM | POA: Diagnosis present

## 2016-02-20 DIAGNOSIS — Z3A38 38 weeks gestation of pregnancy: Secondary | ICD-10-CM | POA: Diagnosis not present

## 2016-02-20 DIAGNOSIS — Z6790 Unspecified blood type, Rh positive: Secondary | ICD-10-CM

## 2016-02-20 DIAGNOSIS — F909 Attention-deficit hyperactivity disorder, unspecified type: Secondary | ICD-10-CM | POA: Diagnosis present

## 2016-02-20 DIAGNOSIS — Z3493 Encounter for supervision of normal pregnancy, unspecified, third trimester: Secondary | ICD-10-CM | POA: Diagnosis present

## 2016-02-20 LAB — URIC ACID: Uric Acid, Serum: 6 mg/dL (ref 2.3–6.6)

## 2016-02-20 LAB — CBC
HEMATOCRIT: 42.8 % (ref 35.0–47.0)
Hemoglobin: 14.6 g/dL (ref 12.0–16.0)
MCH: 31.8 pg (ref 26.0–34.0)
MCHC: 34.1 g/dL (ref 32.0–36.0)
MCV: 93.2 fL (ref 80.0–100.0)
PLATELETS: 194 10*3/uL (ref 150–440)
RBC: 4.59 MIL/uL (ref 3.80–5.20)
RDW: 13.3 % (ref 11.5–14.5)
WBC: 15.9 10*3/uL — AB (ref 3.6–11.0)

## 2016-02-20 LAB — TYPE AND SCREEN
ABO/RH(D): B POS
ANTIBODY SCREEN: NEGATIVE

## 2016-02-20 LAB — PROTEIN / CREATININE RATIO, URINE
Creatinine, Urine: 302 mg/dL
PROTEIN CREATININE RATIO: 0.13 mg/mg{creat} (ref 0.00–0.15)
Total Protein, Urine: 39 mg/dL

## 2016-02-20 MED ORDER — MEASLES, MUMPS & RUBELLA VAC ~~LOC~~ INJ
0.5000 mL | INJECTION | Freq: Once | SUBCUTANEOUS | Status: DC
  Filled 2016-02-20: qty 0.5

## 2016-02-20 MED ORDER — SODIUM CHLORIDE 0.9% FLUSH: 3.0000 mL | Freq: Two times a day (BID) | INTRAVENOUS | Status: DC

## 2016-02-20 MED ORDER — TETANUS-DIPHTH-ACELL PERTUSSIS 5-2.5-18.5 LF-MCG/0.5 IM SUSP: 0.5000 mL | Freq: Once | INTRAMUSCULAR | Status: DC

## 2016-02-20 MED ORDER — OXYCODONE-ACETAMINOPHEN 5-325 MG PO TABS
2.0000 | ORAL_TABLET | ORAL | Status: DC | PRN
  Administered 2016-02-21 (×2): 2 via ORAL
  Filled 2016-02-20 (×2): qty 2

## 2016-02-20 MED ORDER — FENTANYL 2.5 MCG/ML W/ROPIVACAINE 0.2% IN NS 100 ML EPIDURAL INFUSION (ARMC-ANES)
EPIDURAL | Status: AC
  Filled 2016-02-20: qty 100

## 2016-02-20 MED ORDER — DOCUSATE SODIUM 100 MG PO CAPS: 100.0000 mg | ORAL_CAPSULE | Freq: Two times a day (BID) | ORAL | Status: DC | PRN

## 2016-02-20 MED ORDER — PRENATAL MULTIVITAMIN CH
1.0000 | ORAL_TABLET | Freq: Every day | ORAL | Status: DC
  Administered 2016-02-21 – 2016-02-22 (×2): 1 via ORAL
  Filled 2016-02-20 (×2): qty 1

## 2016-02-20 MED ORDER — ONDANSETRON HCL 4 MG/2ML IJ SOLN: 4.0000 mg | Freq: Four times a day (QID) | INTRAMUSCULAR | Status: DC | PRN

## 2016-02-20 MED ORDER — OXYTOCIN 10 UNIT/ML IJ SOLN
INTRAMUSCULAR | Status: AC
  Filled 2016-02-20: qty 2

## 2016-02-20 MED ORDER — ACETAMINOPHEN 325 MG PO TABS: 650.0000 mg | ORAL_TABLET | ORAL | Status: DC | PRN

## 2016-02-20 MED ORDER — ONDANSETRON HCL 4 MG PO TABS: 4.0000 mg | ORAL_TABLET | ORAL | Status: DC | PRN

## 2016-02-20 MED ORDER — AMMONIA AROMATIC IN INHA
RESPIRATORY_TRACT | Status: AC
  Filled 2016-02-20: qty 10

## 2016-02-20 MED ORDER — SENNOSIDES-DOCUSATE SODIUM 8.6-50 MG PO TABS
2.0000 | ORAL_TABLET | ORAL | Status: DC
  Administered 2016-02-21 (×2): 2 via ORAL
  Filled 2016-02-20 (×2): qty 2

## 2016-02-20 MED ORDER — ONDANSETRON HCL 4 MG/2ML IJ SOLN: 4.0000 mg | INTRAMUSCULAR | Status: DC | PRN

## 2016-02-20 MED ORDER — FAMOTIDINE 20 MG PO TABS
10.0000 mg | ORAL_TABLET | Freq: Every day | ORAL | Status: DC
Start: 2016-02-20 — End: 2016-02-22
  Administered 2016-02-20 – 2016-02-22 (×3): 10 mg via ORAL
  Filled 2016-02-20: qty 1
  Filled 2016-02-20: qty 2
  Filled 2016-02-20: qty 1

## 2016-02-20 MED ORDER — FENTANYL 2.5 MCG/ML W/ROPIVACAINE 0.2% IN NS 100 ML EPIDURAL INFUSION (ARMC-ANES)
EPIDURAL | Status: DC | PRN
  Administered 2016-02-20: 9 mL/h via EPIDURAL

## 2016-02-20 MED ORDER — OXYTOCIN 40 UNITS IN LACTATED RINGERS INFUSION - SIMPLE MED
INTRAVENOUS | Status: AC
  Filled 2016-02-20: qty 1000

## 2016-02-20 MED ORDER — FLEET ENEMA 7-19 GM/118ML RE ENEM: 1.0000 | ENEMA | Freq: Every day | RECTAL | Status: DC | PRN

## 2016-02-20 MED ORDER — WITCH HAZEL-GLYCERIN EX PADS: 1.0000 "application " | MEDICATED_PAD | CUTANEOUS | Status: DC | PRN

## 2016-02-20 MED ORDER — LIDOCAINE HCL (PF) 1 % IJ SOLN
INTRAMUSCULAR | Status: DC | PRN
Start: 2016-02-20 — End: 2016-02-20
  Administered 2016-02-20: 3 mL

## 2016-02-20 MED ORDER — COCONUT OIL OIL: 1.0000 "application " | TOPICAL_OIL | Status: DC | PRN

## 2016-02-20 MED ORDER — SIMETHICONE 80 MG PO CHEW: 80.0000 mg | CHEWABLE_TABLET | ORAL | Status: DC | PRN

## 2016-02-20 MED ORDER — DIBUCAINE 1 % RE OINT: 1.0000 "application " | TOPICAL_OINTMENT | RECTAL | Status: DC | PRN

## 2016-02-20 MED ORDER — LACTATED RINGERS IV SOLN
INTRAVENOUS | Status: DC
  Administered 2016-02-20 (×2): 1000 mL via INTRAVENOUS

## 2016-02-20 MED ORDER — SODIUM CHLORIDE 0.9% FLUSH: 3.0000 mL | INTRAVENOUS | Status: DC | PRN

## 2016-02-20 MED ORDER — BUPIVACAINE HCL (PF) 0.25 % IJ SOLN
INTRAMUSCULAR | Status: DC | PRN
  Administered 2016-02-20 (×2): 4 mL via EPIDURAL

## 2016-02-20 MED ORDER — DIPHENHYDRAMINE HCL 25 MG PO CAPS: 25.0000 mg | ORAL_CAPSULE | Freq: Four times a day (QID) | ORAL | Status: DC | PRN

## 2016-02-20 MED ORDER — LIDOCAINE HCL (PF) 1 % IJ SOLN
INTRAMUSCULAR | Status: AC
  Filled 2016-02-20: qty 30

## 2016-02-20 MED ORDER — IBUPROFEN 600 MG PO TABS
600.0000 mg | ORAL_TABLET | Freq: Four times a day (QID) | ORAL | Status: DC | PRN
  Administered 2016-02-20: 600 mg via ORAL

## 2016-02-20 MED ORDER — IBUPROFEN 600 MG PO TABS
600.0000 mg | ORAL_TABLET | Freq: Four times a day (QID) | ORAL | Status: DC
  Administered 2016-02-21 – 2016-02-22 (×7): 600 mg via ORAL
  Filled 2016-02-20 (×7): qty 1

## 2016-02-20 MED ORDER — BENZOCAINE-MENTHOL 20-0.5 % EX AERO
INHALATION_SPRAY | CUTANEOUS | Status: AC
  Filled 2016-02-20: qty 56

## 2016-02-20 MED ORDER — MISOPROSTOL 200 MCG PO TABS
ORAL_TABLET | ORAL | Status: AC
  Filled 2016-02-20: qty 4

## 2016-02-20 MED ORDER — BENZOCAINE-MENTHOL 20-0.5 % EX AERO: 1.0000 "application " | INHALATION_SPRAY | CUTANEOUS | Status: DC | PRN

## 2016-02-20 MED ORDER — BUTORPHANOL TARTRATE 1 MG/ML IJ SOLN
1.0000 mg | INTRAMUSCULAR | Status: DC | PRN
  Administered 2016-02-20: 2 mg via INTRAVENOUS
  Filled 2016-02-20: qty 2

## 2016-02-20 MED ORDER — OXYCODONE-ACETAMINOPHEN 5-325 MG PO TABS
1.0000 | ORAL_TABLET | ORAL | Status: DC | PRN
  Administered 2016-02-20 – 2016-02-22 (×2): 1 via ORAL
  Filled 2016-02-20 (×2): qty 1

## 2016-02-20 MED ORDER — ZOLPIDEM TARTRATE 5 MG PO TABS
5.0000 mg | ORAL_TABLET | Freq: Every evening | ORAL | Status: DC | PRN
Start: 2016-02-20 — End: 2016-02-22

## 2016-02-20 MED ORDER — SODIUM CHLORIDE 0.9 % IV SOLN: 250.0000 mL | INTRAVENOUS | Status: DC | PRN

## 2016-02-20 MED ORDER — BISACODYL 10 MG RE SUPP: 10.0000 mg | Freq: Every day | RECTAL | Status: DC | PRN

## 2016-02-20 MED ORDER — IBUPROFEN 600 MG PO TABS
ORAL_TABLET | ORAL | Status: AC
  Filled 2016-02-20: qty 1

## 2016-02-20 MED ORDER — LIDOCAINE-EPINEPHRINE (PF) 1.5 %-1:200000 IJ SOLN
INTRAMUSCULAR | Status: DC | PRN
  Administered 2016-02-20: 3 mL via PERINEURAL

## 2016-02-20 NOTE — Anesthesia Preprocedure Evaluation (Signed)
Anesthesia Evaluation  Patient identified by MRN, date of birth, ID band Patient awake    Reviewed: Allergy & Precautions, H&P , NPO status , Patient's Chart, lab work & pertinent test results  History of Anesthesia Complications Negative for: history of anesthetic complications  Airway Mallampati: II  TM Distance: >3 FB Neck ROM: full    Dental no notable dental hx.    Pulmonary neg pulmonary ROS,    Pulmonary exam normal        Cardiovascular negative cardio ROS Normal cardiovascular exam     Neuro/Psych negative neurological ROS     GI/Hepatic negative GI ROS, Neg liver ROS,   Endo/Other  negative endocrine ROS  Renal/GU negative Renal ROS  negative genitourinary   Musculoskeletal   Abdominal   Peds  Hematology negative hematology ROS (+)   Anesthesia Other Findings   Reproductive/Obstetrics (+) Pregnancy                             Anesthesia Physical Anesthesia Plan  ASA: II  Anesthesia Plan: Epidural   Post-op Pain Management:    Induction:   Airway Management Planned:   Additional Equipment:   Intra-op Plan:   Post-operative Plan:   Informed Consent: I have reviewed the patients History and Physical, chart, labs and discussed the procedure including the risks, benefits and alternatives for the proposed anesthesia with the patient or authorized representative who has indicated his/her understanding and acceptance.     Plan Discussed with: Anesthesiologist  Anesthesia Plan Comments:         Anesthesia Quick Evaluation

## 2016-02-20 NOTE — Anesthesia Procedure Notes (Deleted)
Performed by: Maclaine Ahola       

## 2016-02-20 NOTE — Anesthesia Procedure Notes (Addendum)
Epidural Patient location during procedure: OB Start time: 02/20/2016 9:40 AM End time: 02/20/2016 9:46 AM  Staffing Resident/CRNA: Stormy FabianURTIS, Eleen Litz Performed: resident/CRNA   Preanesthetic Checklist Completed: patient identified, site marked, surgical consent, pre-op evaluation, timeout performed, IV checked, risks and benefits discussed and monitors and equipment checked  Epidural Patient position: sitting Prep: Betadine Patient monitoring: heart rate, continuous pulse ox and blood pressure Approach: midline Location: L4-L5 Injection technique: LOR saline  Needle:  Needle type: Tuohy  Needle gauge: 17 G Needle length: 9 cm and 9 Needle insertion depth: 6 cm Catheter type: closed end flexible Catheter size: 19 Gauge Catheter at skin depth: 11 cm Test dose: negative and 1.5% lidocaine with Epi 1:200 K  Assessment Sensory level: T10 Events: blood not aspirated, injection not painful, no injection resistance, negative IV test and no paresthesia  Additional Notes Pt. Evaluated and documentation done after procedure finished. Patient identified. Risks/Benefits/Options discussed with patient including but not limited to bleeding, infection, nerve damage, paralysis, failed block, incomplete pain control, headache, blood pressure changes, nausea, vomiting, reactions to medication both or allergic, itching and postpartum back pain. Confirmed with bedside nurse the patient's most recent platelet count. Confirmed with patient that they are not currently taking any anticoagulation, have any bleeding history or any family history of bleeding disorders. Patient expressed understanding and wished to proceed. All questions were answered. Sterile technique was used throughout the entire procedure. Please see nursing notes for vital signs. Test dose was given through epidural catheter and negative prior to continuing to dose epidural or start infusion. Warning signs of high block given to the patient  including shortness of breath, tingling/numbness in hands, complete motor block, or any concerning symptoms with instructions to call for help. Patient was given instructions on fall risk and not to get out of bed. All questions and concerns addressed with instructions to call with any issues or inadequate analgesia.   Patient tolerated the insertion well without immediate complications.Reason for block:procedure for pain

## 2016-02-20 NOTE — Discharge Summary (Signed)
Obstetrical Discharge Summary  Patient Name: Colleen Chapman DOB: September 18, 1995 MRN: [id number]  Date of Admission: 02/20/2016 Date of Discharge: 02/22/16 Primary OB:  [facility]  Gestational Age at Delivery: [redacted]w[redacted]d  An[facility]partum complications[facility]  Admitting Diagnosis: Active labor Secondary Diagnosis: Meconium fluid Patient Active Problem List   Diagnosis Date Noted  . Indication for care in labor and delivery, antepartum 02/20/2016  . Postpartum care following vaginal delivery 07/31/2014  . Irregular contractions 07/30/2014  . Labor and delivery, indication for care 07/26/2014  . Indication for care in labor or delivery 07/22/2014    Augmentation: AROM Complications: None Intrapartum complications/course: Admitted in active labor. AROM for mec. Epidural for pain control. Delivered easily and spontaneously without perineal lacerations. Baby Girl named "Colleen Chapman" Date of Delivery: 02/20/16 Delivered By: BBenjaman Kindler MD Delivery Type: spontaneous vaginal delivery Anesthesia: epidural Placenta: Spontaneous Laceration: None Episiotomy: none Newborn Data: Live born female "Colleen Chapman" Birth Weight: 3590 grams (7#14.6oz) APGAR: 8, 9  Discharge Physical Exam: 02/22/16 BP (!) 116/53   Pulse 100   Temp 98.3 F (36.8 C) (Oral)   Resp 20   Ht _0  (1.651 m)   Wt 186 lb (84.4 kg)   LMP 03/21/2015   Breastfeeding? Unknown   BMI 30.95 kg/m   General: NAD CV: RRR Pulm: CTABL, nl effort ABD: s/nd/nt, fundus firm and below the umbilicus Lochia: appropriate DVT Evaluation: LE non-ttp, no evidence of DVT on exam.  Hemoglobin  Date Value Ref Range Status  02/20/2016 14.6 12.0 - 16.0 g/dL Final   HCT  Date Value Ref Range Status  02/20/2016 42.8 35.0 - 47.0 % Final    Post partum course: Normal  Postpartum Procedures: Depo injection prior to discharge  Disposition: stable, discharge to home. Baby Feeding:  formula Baby Disposition: home with mom  Rh Immune globulin given: n/a Rubella vaccine given: none Tdap vaccine given in AP or PP setting: AP on 12/15/15 Flu vaccine given in AP or PP setting: AP on 12/15/15  Contraception: Depo  Prenatal Labs: GBS neg, Rubella:MMR x 2, VI, Tdap 12/15/15 flu: 12/15/15 B pos, RPR NR, GC/CH neg, Quad neg, HepB neg, [facility]    Plan:  Colleen Profetawas discharged to home in good condition. Follow-up appointment at [facility] in 6 weeks.   Discharge Medications: Allergies as of 02/22/2016   No Known Allergies     Medication List    STOP taking these medications   metoCLOPramide 10 MG tablet Commonly known as:  REGLAN   ranitidine 75 MG tablet Commonly known as:  ZANTAC     TAKE these medications   docusate sodium 100 MG capsule Commonly known as:  COLACE Take 1 capsule (100 mg total) by mouth 2 (two) times daily as needed for mild constipation.   ibuprofen 600 MG tablet Commonly known as:  ADVIL,MOTRIN Take 1 tablet (600 mg total) by mouth every 6 (six) hours. What changed:  medication strength  how much to take  when to take this  reasons to take this   oxyCODONE-acetaminophen 5-325 MG tablet Commonly known as:  PERCOCET/ROXICET Take 1 tablet by mouth every 4 (four) hours as needed (pain scale 4-7). What changed:  reasons to take this   prenatal multivitamin Tabs tablet Take 1 tablet by mouth daily at 12 noon.        Signed: MLars Chapman CNM

## 2016-02-20 NOTE — Anesthesia Procedure Notes (Deleted)
Epidural

## 2016-02-20 NOTE — Progress Notes (Signed)
Spoke with Colleen Chapman. Jones, CNM made aware pt feeling nauseated and vomiting in triage, requesting order for nausea med and to have CNM come repeat cervical exam, pt continues to c/o worsening pain with ctx and asking for pain med. Colleen Chapman. Jones, CNM states she will be there in a minute to assess pt.

## 2016-02-20 NOTE — H&P (Signed)
HISTORY AND PHYSICAL  HISTORY OF PRESENT ILLNESS: [name] is a 20 y.o. G2P1001 at 2542w5d by LMP of 05/25/15 and dating by 17 5/7 US with EDD of 02/29/16 here this am with "UC's every 5 mins since early am" becoming uncomfortable. Pt also had an episode of vomiting and claims she had chills in the pm but, no fever.PNC at [facility] significant for [facility] and declared to be insignificant.    She has  been having contractions Q 2-3 mins and denies leakage of fluid, vaginal bleeding, or decreased fetal movement. P:C ratio was 140 on 02/13/16. No c/o "RUQ pain, blurred vision, double vision, HA or any other concerns for pre-ecclampsia.  Vitals:   02/20/16 0559 02/20/16 0648  BP: (!) 143/77 (!) 139/57  Pulse: (!) 106 (!) 110  Resp:  19  Temp:  98.2 F (36.8 C)      REVIEW OF SYSTEMS: A complete review of systems was performed and was specifically negative for headache, changes in vision, RUQ pain, shortness of breath, chest pain, lower extremity edema and dysuria.   HISTORY:  Past Medical History:  Diagnosis Date  . ADHD (attention deficit hyperactivity disorder)    ongoing    Past Surgical History:  Procedure Laterality Date  . NO PAST SURGERIES      No current facility-administered medications on file prior to encounter.    Current Outpatient Prescriptions on File Prior to Encounter  Medication Sig Dispense Refill  . Prenatal Vit-Fe Fumarate-FA (PRENATAL MULTIVITAMIN) TABS tablet Take 1 tablet by mouth daily at 12 noon.    . docusate sodium (COLACE) 100 MG capsule Take 1 capsule (100 mg total) by mouth 2 (two) times daily as needed for mild constipation. 60 capsule 1  . ibuprofen (ADVIL) 200 MG tablet Take 1 tablet (200 mg total) by mouth every 6 (six) hours as needed. (Patient not taking: Reported on 02/09/2016) 60 tablet 1  . metoCLOPramide (REGLAN) 10 MG tablet Take 1 tablet (10 mg total) by mouth every 6 (six) hours as needed. (Patient not taking:  Reported on 02/13/2016) 12 tablet 0  . oxyCODONE-acetaminophen (PERCOCET/ROXICET) 5-325 MG per tablet Take 1 tablet by mouth every 4 (four) hours as needed (for pain scale 4-7). (Patient not taking: Reported on 02/13/2016) 15 tablet 0  . ranitidine (ZANTAC) 75 MG tablet Take 1 tablet (75 mg total) by mouth 2 (two) times daily. (Patient not taking: Reported on 02/13/2016) 30 tablet 0     No Known Allergies  OB History  Gravida Para Term Preterm AB Living  2 1 1  0 0 1  SAB TAB Ectopic Multiple Live Births  0 0 0 0 1    # Outcome Date GA Lbr Len/2nd Weight Sex Delivery Anes PTL Lv  2 Current           1 Term 07/30/14 1457w1d 25:00 / 00:54 8 lb 0.2 oz (3.635 kg) M Vag-Spont EPI  LIV     Birth Comments: none observed      Gynecologic History: History of Abnormal Pap Smear: no pap found  History of STI: None found  Social History  Substance Use Topics  . Smoking status: Never Smoker  . Smokeless tobacco: Never Used  . Alcohol use No    PHYSICAL EXAM: Temp:  [98.2 F (36.8 C)] 98.2 F (36.8 C) (12/19 0648) Pulse Rate:  [106-110] 110 (12/19 0648) Resp:  [19] 19 (12/19 0648) BP: (139-143)/(57-77) 139/57 (12/19 40980648) Weight:  [186 lb (  84.4 kg)] 186 lb (84.4 kg) (12/19 0559)  GENERAL: NAD AAOx3 Heart: S1S2, RRR, No M/R/G. Lungs: CTA bilat, no W/R/R. ABDOMEN: gravid, nontender, EFW 7#14oz by Le[place] EXTREMITIES:  Warm and well-perfused, nontender, nonedematous, 2+DTRs 0 clonus CERVIX: 3/ 80%/ vtx-2  FHT:s baseline with mod variability+accelerations and no decelerations  Toco: q 2-3 mins, palp sl mod,   DIAGNOSTIC STUDIES:  Recent Labs Lab 02/13/16 1425  WBC 12.0*  HGB 13.7  HCT 39.8  PLT 226  NA 134*  K 4.0  CL 105  CO2 21*  BUN 8  CREATININE 0.54  GLUCOSE 86  CALCIUM 9.0  ALKPHOS 137*  AST 24  ALT 16  PROT 6.2*    PRENATAL STUDIES:  Prenatal Labs:  MBT: B pos; Rubella immune, Varicella immune, HIV neg, RPR neg, Hep B neg, GC/CT neg, GBS neg, glucola  83  Last US 19 2/7 wks (weight not found)  placenta posterior above the os, AF wnl, normal anatomy  ASSESSMENT AND PLAN:  1. Fetal Well being  - Fetal Tracing:Cat 1 - Ultrasound:  reviewed, as above - Group B Streptococcus:neg - Presentation:vtx confirmed by CNM  2. Routine OB: - Prenatal labs reviewed, as above - Rh B pos  3. Induction of Labor:  -  Contractions external toco in place -  Pelvis proven to 8#2oz -  Plan for admission and delivery  4. Post Partum Planning: - Infant feeding: not asked - Contraception: will decide

## 2016-02-20 NOTE — Progress Notes (Signed)
Colleen Chapman is a 20 y.o. G2P1001 at 7865w5d in active labor  Subjective: Comfortable with epidural  Objective: BP (!) 116/53   Pulse 100   Temp 98.2 F (36.8 C) (Oral)   Resp 18   Ht 5\' 5"  (1.651 m)   Wt 186 lb (84.4 kg)   LMP 03/21/2015   BMI 30.95 kg/m  I/O last 3 completed shifts: In: -  Out: 200 [Emesis/NG output:200] No intake/output data recorded.  FHT:  FHR: 145 bpm, variability: moderate,  accelerations:  Present,  decelerations:  Absent UC:   regular, every 2-4 minutes SVE:   Dilation: 6 Effacement (%): 80 Station: -1 Exam by:: Mate Alegria  Labs: Lab Results  Component Value Date   WBC 15.9 (H) 02/20/2016   HGB 14.6 02/20/2016   HCT 42.8 02/20/2016   MCV 93.2 02/20/2016   PLT 194 02/20/2016    Assessment / Plan: Spontaneous labor, progressing normally  Labor: Progressing normally and AROM for mec fluid at 1:15pm Preeclampsia:  none Fetal Wellbeing:  Category I Pain Control:  Labor support without medications and Epidural I/D:  n/a Anticipated MOD:  NSVD  Dahna Hattabaugh 02/20/2016, 1:17 PM

## 2016-02-21 LAB — CBC
HCT: 38.5 % (ref 35.0–47.0)
Hemoglobin: 13.1 g/dL (ref 12.0–16.0)
MCH: 31.9 pg (ref 26.0–34.0)
MCHC: 34.1 g/dL (ref 32.0–36.0)
MCV: 93.7 fL (ref 80.0–100.0)
Platelets: 171 10*3/uL (ref 150–440)
RBC: 4.11 MIL/uL (ref 3.80–5.20)
RDW: 13.7 % (ref 11.5–14.5)
WBC: 10.6 10*3/uL (ref 3.6–11.0)

## 2016-02-21 LAB — RPR: RPR Ser Ql: NONREACTIVE

## 2016-02-21 NOTE — Anesthesia Postprocedure Evaluation (Signed)
Anesthesia Post Note  Patient: Vicente SereneKrista Gordon Dupre  Procedure(s) Performed: * No procedures listed *  Patient location during evaluation: Mother Baby Anesthesia Type: Epidural Level of consciousness: awake and alert and oriented Pain management: pain level controlled Vital Signs Assessment: post-procedure vital signs reviewed and stable Respiratory status: spontaneous breathing Cardiovascular status: stable Postop Assessment: no headache, adequate PO intake and no signs of nausea or vomiting Anesthetic complications: no     Last Vitals:  Vitals:   02/21/16 0028 02/21/16 0327  BP: 121/76 (!) 110/50  Pulse: 92 84  Resp: 16 17  Temp: 36.6 C 36.5 C    Last Pain:  Vitals:   02/21/16 0327  TempSrc: Oral  PainSc:                  Rica MastBachich,  Anslie Spadafora M

## 2016-02-21 NOTE — Progress Notes (Signed)
Post Partum Day 1 Subjective: no complaints  Objective: Blood pressure 117/74, pulse 92, temperature 98.4 F (36.9 C), temperature source Oral, resp. rate 20, height 5\' 5"  (1.651 m), weight 186 lb (84.4 kg), last menstrual period 03/21/2015, SpO2 99 %, unknown if currently breastfeeding.  Physical Exam:  General: alert and cooperative  Heart:S1S2, RRR, No M/R/G. Lungs:CTA bilat, no W/R/R. Lochia: mod, no clots.   Uterine Fundus: firm, U at umbilicus DVT Evaluation: Neg Homan's   Recent Labs  02/20/16 0738 02/21/16 0629  HGB 14.6 13.1  HCT 42.8 38.5  WBC 15.9* 10.6  PLT 194 171    Assessment/Plan: A:PPD#1   LOS: 1 day   Sharee Pimplearon W Ruhi Kopke 02/21/2016, 8:47 AM

## 2016-02-22 MED ORDER — OXYCODONE-ACETAMINOPHEN 5-325 MG PO TABS: 1.0000 | ORAL_TABLET | ORAL | 0 refills | Status: DC | PRN

## 2016-02-22 MED ORDER — MEDROXYPROGESTERONE ACETATE 150 MG/ML IM SUSP
150.0000 mg | Freq: Once | INTRAMUSCULAR | Status: AC
  Administered 2016-02-22: 150 mg via INTRAMUSCULAR
  Filled 2016-02-22: qty 1

## 2016-02-22 MED ORDER — IBUPROFEN 600 MG PO TABS: 600.0000 mg | ORAL_TABLET | Freq: Four times a day (QID) | ORAL | 0 refills | Status: DC

## 2016-02-22 NOTE — Progress Notes (Signed)
Discharge order received from doctor. Depo given prior to discharge. Reviewed discharge instructions and prescriptions with patient and answered all questions. Follow up appointment instructions given. Patient verbalized understanding. ID bands checked. Patient discharged home with infant via wheelchair by nursing/auxillary.    Eileen Croswell Garner, RN 

## 2016-02-22 NOTE — Discharge Instructions (Signed)
Please call your doctor or return to the ER if you experience any chest pains, shortness of breath, fever greater than 101, any heavy bleeding (saturating more than 1 pad per hour), large clots, or foul smelling discharge, any worsening abdominal pain and cramping that is not controlled by pain medication, or any signs of postpartum depression. No tampons, enemas, douches, or sexual intercourse for 6 weeks. Also avoid tub baths, hot tubs, or swimming for 6 weeks.  ° ° ° °Care After Vaginal Delivery °Congratulations on your new baby!! ° °Refer to this sheet in the next few weeks. These discharge instructions provide you with information on caring for yourself after delivery. Your caregiver may also give you specific instructions. Your treatment has been planned according to the most current medical practices available, but problems sometimes occur. Call your caregiver if you have any problems or questions after you go home. ° °HOME CARE INSTRUCTIONS °· Take over-the-counter or prescription medicines only as directed by your caregiver or pharmacist. °· Do not drink alcohol, especially if you are breastfeeding or taking medicine to relieve pain. °· Do not chew or smoke tobacco. °· Do not use illegal drugs. °· Continue to use good perineal care. Good perineal care includes: °¨ Wiping your perineum from front to back. °¨ Keeping your perineum clean. °· Do not use tampons or douche until your caregiver says it is okay. °· Shower, wash your hair, and take tub baths as directed by your caregiver. °· Wear a well-fitting bra that provides breast support. °· Eat healthy foods. °· Drink enough fluids to keep your urine clear or pale yellow. °· Eat high-fiber foods such as whole grain cereals and breads, brown rice, beans, and fresh fruits and vegetables every day. These foods may help prevent or relieve constipation. °· Follow your caregiver's recommendations regarding resumption of activities such as climbing stairs, driving,  lifting, exercising, or traveling. Specifically, no driving for two weeks, so that you are comfortable reacting quickly in an emergency. °· Talk to your caregiver about resuming sexual activities. Resumption of sexual activities is dependent upon your risk of infection, your rate of healing, and your comfort and desire to resume sexual activity. Usually we recommend waiting about six weeks, or until your bleeding stops and you are interested in sex. °· Try to have someone help you with your household activities and your newborn for at least a few days after you leave the hospital. Even longer is better. °· Rest as much as possible. Try to rest or take a nap when your newborn is sleeping. Sleep deprivation can be very hard after delivery. °· Increase your activities gradually. °· Keep all of your scheduled postpartum appointments. It is very important to keep your scheduled follow-up appointments. At these appointments, your caregiver will be checking to make sure that you are healing physically and emotionally. ° °SEEK MEDICAL CARE IF:  °· You are passing large clots from your vagina.  °· You have a foul smelling discharge from your vagina. °· You have trouble urinating. °· You are urinating frequently. °· You have pain when you urinate. °· You have a change in your bowel movements. °· You have increasing redness, pain, or swelling near your vaginal incision (episiotomy) or vaginal tear. °· You have pus draining from your episiotomy or vaginal tear. °· Your episiotomy or vaginal tear is separating. °· You have painful, hard, or reddened breasts. °· You have a severe headache. °· You have blurred vision or see spots. °· You feel   sad or depressed. °· You have thoughts of hurting yourself or your newborn. °· You have questions about your care, the care of your newborn, or medicines. °· You are dizzy or light-headed. °· You have a rash. °· You have nausea or vomiting. °· You were breastfeeding and have not had a  menstrual period within 12 weeks after you stopped breastfeeding. °· You are not breastfeeding and have not had a menstrual period by the 12th week after delivery. °· You have a fever. ° °SEEK IMMEDIATE MEDICAL CARE IF:  °· You have persistent pain. °· You have chest pain. °· You have shortness of breath. °· You faint. °· You have leg pain. °· You have stomach pain. °· Your vaginal bleeding saturates two or more sanitary pads in 1 hour. ° °MAKE SURE YOU:  °· Understand these instructions. °· Will get help right away if you are not doing well or get worse. °·  °Document Released: 02/16/2000 Document Revised: 07/05/2013 Document Reviewed: 10/16/2011 ° °ExitCare® Patient Information ©2015 [employer]. This information is not intended to replace advice given to you by your health care provider. Make sure you discuss any questions you have with your health care provider. ° °

## 2016-06-25 LAB — HM PAP SMEAR: HM Pap smear: NEGATIVE

## 2016-10-09 ENCOUNTER — Ambulatory Visit
Admission: EM | Admit: 2016-10-09 | Discharge: 2016-10-09 | Disposition: A | Discharge status: Home / Self Care | Payer: Medicaid Other | Attending: Family Medicine | Admitting: Family Medicine

## 2016-10-09 DIAGNOSIS — M542 Cervicalgia: Secondary | ICD-10-CM

## 2016-10-09 DIAGNOSIS — S56911A Strain of unspecified muscles, fascia and tendons at forearm level, right arm, initial encounter: Secondary | ICD-10-CM

## 2016-10-09 DIAGNOSIS — S56912A Strain of unspecified muscles, fascia and tendons at forearm level, left arm, initial encounter: Secondary | ICD-10-CM

## 2016-10-09 DIAGNOSIS — X503XXA Overexertion from repetitive movements, initial encounter: Secondary | ICD-10-CM

## 2016-10-09 HISTORY — DX: Encounter for surveillance of injectable contraceptive: Z30.42

## 2016-10-09 MED ORDER — NAPROXEN 500 MG PO TABS: 500.0000 mg | ORAL_TABLET | Freq: Two times a day (BID) | ORAL | 0 refills | Status: DC

## 2016-10-09 NOTE — ED Triage Notes (Signed)
21 year old Native [place] woman is here today with complaints of upper back pain, bilateral wrist pain, numbness in her right foot for the past week. She states she haves most of the pain while she is at work.

## 2016-10-09 NOTE — ED Provider Notes (Signed)
[facility]    CSN: [id number] Arrival date & time: 10/09/16  1806     History   Chief Complaint Chief Complaint  Patient presents with  . Back Pain  . Wrist Pain  . Numbness    HPI Colleen Chapman is a 21 y.o. female.   HPI   Patient presents with complaints of 3 separate problems have been present for the last week  First complaint is upper back and neck pain that is all right sided. She states that it bothers her when she moves her neck in rotation to the right. She also feels the pain in the right trapezius and into the inner scapular area along the medial scapular border. No upper extremity radiation from this specific pain. He feels the pain mostly at work. 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 She also has 2 young children at home a 4716-month-old and a 22-year-old.  Her second problem is that of volar forearm pain near the wrists seems to bother her more at work. She has no radiation into her fingers. Has no numbness or tingling. Flexion ocean seem to bother her the worst. She has not started work today but has noticed that the pain has been present all day while at home as well.  Her third problem is of a burning sensation in the top of her foot that is momentary. It doesn't have any rhyme or reason to. Does not cause her to limp. She has no ecchymosis or erythema and has had no injury to her foot.          Past Medical History:  Diagnosis Date  . ADHD (attention deficit hyperactivity disorder)    ongoing  . On Depo-Provera for contraception     Patient Active Problem List   Diagnosis Date Noted  . Indication for care in labor and delivery, antepartum 02/20/2016  . Postpartum care following vaginal delivery 07/31/2014  . Irregular contractions 07/30/2014  . Labor and delivery, indication for care 07/26/2014  . Indication for care in labor or delivery 07/22/2014    Past Surgical History:  Procedure Laterality Date    . NO PAST SURGERIES      OB History    Gravida Para Term Preterm AB Living   2 2 2  0 0 2   SAB TAB Ectopic Multiple Live Births   0 0 0 0 2       Home Medications    Prior to Admission medications   Medication Sig Start Date End Date Taking? Authorizing Provider  medroxyPROGESTERone (DEPO-PROVERA) 150 MG/ML injection Inject 150 mg into the muscle every 3 (three) months.   Yes [provider]  naproxen (NAPROSYN) 500 MG tablet Take 1 tablet (500 mg total) by mouth 2 (two) times daily with a meal. 10/09/16   Lutricia Feiloemer, Yaritsa Savarino P, PA-C    Family History Family History  Problem Relation Age of Onset  . Bipolar disorder Mother   . Diabetes Father   . Hypertension Father     Social History Social History  Substance Use Topics  . Smoking status: Never Smoker  . Smokeless tobacco: Never Used  . Alcohol use No     Allergies   Patient has no known allergies.   Review of Systems Review of Systems  Constitutional: Positive for activity change. Negative for appetite change, chills, fatigue and fever.  Musculoskeletal: Positive for myalgias and neck pain.  All other systems reviewed and are negative.  Physical Exam Triage Vital Signs ED Triage Vitals  Enc Vitals Group     BP 10/09/16 1836 119/74     Pulse Rate 10/09/16 1836 88     Resp 10/09/16 1836 16     Temp 10/09/16 1836 98.5 F (36.9 C)     Temp Source 10/09/16 1836 Oral     SpO2 10/09/16 1836 98 %     Weight 10/09/16 1839 150 lb (68 kg)     Height 10/09/16 1839 5\' 6"  (1.676 m)     Head Circumference --      Peak Flow --      Pain Score 10/09/16 1839 6     Pain Loc --      Pain Edu? --      Excl. in GC? --    No data found.   Updated Vital Signs BP 119/74 (BP Location: Left Arm)   Pulse 88   Temp 98.5 F (36.9 C) (Oral)   Resp 16   Ht 5\' 6"  (1.676 m)   Wt 150 lb (68 kg)   SpO2 98%   Breastfeeding? No   BMI 24.21 kg/m   Visual Acuity Right Eye Distance:   Left Eye Distance:    Bilateral Distance:    Right Eye Near:   Left Eye Near:    Bilateral Near:     Physical Exam  Constitutional: She appears well-developed and well-nourished. No distress.  HENT:  Head: Normocephalic.  Eyes: Pupils are equal, round, and reactive to light.  Neck: Neck supple.  Patient has mild limitation of motion to rightward rotation. Other motion is normal and comfortable. She has  tenderness along the inferior posterior para spinous muscles on the right. This extends into the trapezius and intrascapular. Extremity strength is intact as is sensation. DTRs are 2+ over 4 and symmetrical.  Musculoskeletal: Normal range of motion. She exhibits no edema, tenderness or deformity.  Examination of the volar forearms at the wrist shows a negative Tinel's negative Phalen's. There is no tenderness present tonight. There is good range of motion of the wrist and fingers. Is no ecchymosis or warmth or swelling or erythema.  Lymphadenopathy:    She has no cervical adenopathy.  Skin: She is not diaphoretic.  Nursing note and vitals reviewed.    UC Treatments / Results  Labs (all labs ordered are listed, but only abnormal results are displayed) Labs Reviewed - No data to display  EKG  EKG Interpretation None       Radiology No results found.  Procedures Procedures (including critical care time)  Medications Ordered in UC Medications - No data to display   Initial Impression / Assessment and Plan / UC Course  I have reviewed the triage vital signs and the nursing notes.  Pertinent labs & imaging results that were available during my care of the patient were reviewed by me and considered in my medical decision making (see chart for details).  Plan: 1. Test/x-ray results and diagnosis reviewed with patient 2. rx as per orders; risks, benefits, potential side effects reviewed with patient 3. Recommend supportive treatment with symptom avoidance and rest as much as feasible. I will  treat her symptomatically with an anti-inflammatory medication at this time to see if this helps. I've also recommended that she may need further workup and have encouraged her to find a primary care physician in the area. She was to return to work Quarry manager. 4. F/u prn if symptoms worsen or don't improve  Final Clinical Impressions(s) / UC Diagnoses   Final diagnoses:  Cervical muscle pain  Repetitive strain injury of both forearms    New Prescriptions Discharge Medication List as of 10/09/2016  7:12 PM    START taking these medications   Details  naproxen (NAPROSYN) 500 MG tablet Take 1 tablet (500 mg total) by mouth 2 (two) times daily with a meal., Starting Wed 10/09/2016, Normal         Controlled Substance Prescriptions [place] Controlled Substance Registry consulted? Not Applicable   Lutricia Feil, PA-C 10/09/16 1941

## 2016-12-02 ENCOUNTER — Encounter: Payer: Self-pay | Admitting: *Deleted

## 2016-12-02 ENCOUNTER — Ambulatory Visit
Admission: EM | Admit: 2016-12-02 | Discharge: 2016-12-02 | Disposition: A | Discharge status: Home / Self Care | Payer: Self-pay | Attending: Family Medicine | Admitting: Family Medicine

## 2016-12-02 DIAGNOSIS — H6001 Abscess of right external ear: Secondary | ICD-10-CM

## 2016-12-02 MED ORDER — LIDOCAINE HCL (PF) 1 % IJ SOLN: 5.0000 mL | Freq: Once | INTRAMUSCULAR | Status: DC

## 2016-12-02 MED ORDER — SULFAMETHOXAZOLE-TRIMETHOPRIM 800-160 MG PO TABS: 1.0000 | ORAL_TABLET | Freq: Two times a day (BID) | ORAL | 0 refills | Status: AC

## 2016-12-02 MED ORDER — CEFTRIAXONE SODIUM 1 G IJ SOLR
1.0000 g | Freq: Once | INTRAMUSCULAR | Status: AC
  Administered 2016-12-02: 1 g via INTRAMUSCULAR

## 2016-12-02 MED ORDER — OXYCODONE-ACETAMINOPHEN 5-325 MG PO TABS: 1.0000 | ORAL_TABLET | Freq: Three times a day (TID) | ORAL | 0 refills | Status: DC | PRN

## 2016-12-02 MED ORDER — MUPIROCIN 2 % EX OINT: TOPICAL_OINTMENT | CUTANEOUS | 0 refills | Status: DC

## 2016-12-02 NOTE — ED Triage Notes (Signed)
Patient started having symptom of painful cyst / abscess behind right ear 2 weeks ago. No previous history of abscess behind right ear.

## 2016-12-02 NOTE — ED Provider Notes (Signed)
[facility] ____________________________________________  Time seen: Approximately 9:25 PM  I have reviewed the triage vital signs and the nursing notes.   HISTORY  Chief Complaint Abscess   HPI Colleen Chapman is a 21 y.o. female  presenting for evaluation of tender and swollen area to right ear, stating is an abscess. Patient reports she has had a small firm and swollen area to that same place has been present for several months, possibly more, about a change. Reports the last week the area has grown in size as well as tenderness. Denies any known drainage, reports her boyfriend tried to express some drainage. States area is very tender to palpation. Denies any accompanying fever, neck pain, pain radiation, paresthesias, other skin changes. Denies insect bite, trauma or hearing changes. Denies any ear drainage. Denies history of abscesses in the past. Denies history of MRSA. Reports continues to eat and drink well. Reports otherwise feels well. Has taken some over-the-counter Tylenol and ibuprofen with somewhat in pain, no resolution. Denies other of the measures. Denies chest pain, shortness of breath, abdominal pain. Denies recent sickness. Denies recent antibiotic use. Reports tetanus immunization is up-to-date. No LMP recorded. Patient has had an injection. Denies pregnancy. No breast feeding.  Department, [facility]: PCP   Past Medical History:  Diagnosis Date  . ADHD (attention deficit hyperactivity disorder)    ongoing  . On Depo-Provera for contraception     Patient Active Problem List   Diagnosis Date Noted  . Indication for care in labor and delivery, antepartum 02/20/2016  . Postpartum care following vaginal delivery 07/31/2014  . Irregular contractions 07/30/2014  . Labor and delivery, indication for care 07/26/2014  . Indication for care in labor or delivery 07/22/2014    Past Surgical History:  Procedure Laterality Date  . NO PAST  SURGERIES        Current Facility-Administered Medications:  .  lidocaine (PF) (XYLOCAINE) 1 % injection 5 mL, 5 mL, Other, Once, Renford Dills, NP  Current Outpatient Prescriptions:  .  medroxyPROGESTERone (DEPO-PROVERA) 150 MG/ML injection, Inject 150 mg into the muscle every 3 (three) months., Disp: , Rfl:  .  mupirocin ointment (BACTROBAN) 2 %, Apply three times a day for 7 days., Disp: 22 g, Rfl: 0 .  naproxen (NAPROSYN) 500 MG tablet, Take 1 tablet (500 mg total) by mouth 2 (two) times daily with a meal., Disp: 60 tablet, Rfl: 0 .  oxyCODONE-acetaminophen (ROXICET) 5-325 MG tablet, Take 1 tablet by mouth every 8 (eight) hours as needed for moderate pain or severe pain (Do not drive or operate heavy machinery while taking as can cause drowsiness.)., Disp: 4 tablet, Rfl: 0 .  sulfamethoxazole-trimethoprim (BACTRIM DS,SEPTRA DS) 800-160 MG tablet, Take 1 tablet by mouth 2 (two) times daily., Disp: 20 tablet, Rfl: 0  Allergies Patient has no known allergies.  Family History  Problem Relation Age of Onset  . Bipolar disorder Mother   . Diabetes Father   . Hypertension Father     Social History Social History  Substance Use Topics  . Smoking status: Never Smoker  . Smokeless tobacco: Never Used  . Alcohol use No    Review of Systems Constitutional: No fever/chills Eyes: No visual changes. ENT: No sore throat. AS above.  Cardiovascular: Denies chest pain. Respiratory: Denies shortness of breath. Gastrointestinal: No abdominal pain.  No nausea, no vomiting.  Genitourinary: Negative for dysuria. Musculoskeletal: Negative for back pain. Skin: Negative for rash.As above.   ____________________________________________   PHYSICAL EXAM:  VITAL SIGNS: ED Triage Vitals  Enc Vitals Group     BP 12/02/16 1841 115/73     Pulse Rate 12/02/16 1841 64     Resp 12/02/16 1841 16     Temp 12/02/16 1841 98.1 F (36.7 C)     Temp Source 12/02/16 1841 Oral     SpO2 12/02/16 1841  100 %     Weight --      Height --      Head Circumference --      Peak Flow --      Pain Score 12/02/16 1842 9     Pain Loc --      Pain Edu? --      Excl. in GC? --     Constitutional: Alert and oriented. Well appearing and in no acute distress. Eyes: Conjunctivae are normal.  ENT      Head: Normocephalic and atraumatic.      Ears: Left: nontender, no erythema, normal TM, post auricle at auricular crease black heads noted, with <1cm mobile nontender indurated area at base of lobe, no erythema. Right: mild tenderness with auricle movement, normal canal, normal TM, right post auricular at auricular crease approximately 2x2 cm fluctuant nonpointing erythematous abscess along skin crease, no surrounding erythema no other surrounding erythema or tenderness. NO mastoid tenderness bilaterally.       Nose: No congestion/rhinnorhea.      Mouth/Throat: Mucous membranes are moist.Oropharynx non-erythematous. Neck: No stridor. Supple without meningismus.  Hematological/Lymphatic/Immunilogical: No cervical lymphadenopathy. Cardiovascular: Normal rate, regular rhythm. Grossly normal heart sounds.  Good peripheral circulation. Respiratory: Normal respiratory effort without tachypnea nor retractions. Breath sounds are clear and equal bilaterally. No wheezes, rales, rhonchi. Musculoskeletal:  No midline cervical, thoracic or lumbar tenderness to palpation.  Neurologic:  Normal speech and language. No gross focal neurologic deficits are appreciated. Speech is normal. No gait instability.  Skin:  Skin is warm, dry. AS above.  Psychiatric: Mood and affect are normal. Speech and behavior are normal. Patient exhibits appropriate insight and judgment   ___________________________________________   LABS (all labs ordered are listed, but only abnormal results are displayed)  Labs Reviewed  AEROBIC CULTURE (SUPERFICIAL SPECIMEN)    RADIOLOGY  No results  found. ____________________________________________   PROCEDURES Procedures   Procedure(s) performed:  Procedure(s) performed:  Procedure explained and verbal consent obtained. Consent: Verbal consent obtained. Written consent not obtained. Risks and benefits: risks, benefits and alternatives were discussed Patient identity confirmed: verbally with patient and hospital-assigned identification number  Consent given by: patient   I&D abscess Location: right post auricular Preparation: Patient was prepped and draped in the usual sterile fashion. Anesthesia with 1% Lidocaine 2 mls Irrigation solution: saline and betadine Amount of cleaning: copious Incision made with #11 blade scalpel Moderate purulent drainage and thick sebaceous drainage immediately obtained with expression. Sterile forceps used to probe and break up loculations.  1/4 " iodoform gauze used and packed.  Patient tolerate well. Wound well approximated post repair.  dressing applied.  Wound care instructions provided.  Observe for any signs of infection or other problems.     INITIAL IMPRESSION / ASSESSMENT AND PLAN / ED COURSE  Pertinent labs & imaging results that were available during my care of the patient were reviewed by me and considered in my medical decision making (see chart for details).  Well appearing patient. No acute distress. Patient with fluctuant postauricular erythematous abscess. No surrounding erythema, swelling, and no mastoid tenderness. I&D performed, patient tolerated well. Culture obtained.  Suspect infected sebaceous cyst. 1 g IM Rocephin given in urgent care. Will see patient on oral Bactrim and topical Bactroban. Return in 2-3 days for wound check and packing removal. Discussed wound care. Discussed strict follow-up, sooner return parameters including any worsening concerns, fevers, increased tenderness, increased surrounding swelling or redness, worsening concerns. Quantity 4 Percocet also  given. Work noted given for tomorrow. Discussed indication, risks and benefits of medications with patient.   [place] controlled substance database reviewed, no recent controlled substances documented.   Discussed follow up with Primary care physician this week as needed. Discussed follow up and return parameters including no resolution or any worsening concerns. Patient verbalized understanding and agreed to plan.   ____________________________________________   FINAL CLINICAL IMPRESSION(S) / ED DIAGNOSES  Final diagnoses:  Abscess of right external ear     Discharge Medication List as of 12/02/2016  8:17 PM    START taking these medications   Details  mupirocin ointment (BACTROBAN) 2 % Apply three times a day for 7 days., Normal    oxyCODONE-acetaminophen (ROXICET) 5-325 MG tablet Take 1 tablet by mouth every 8 (eight) hours as needed for moderate pain or severe pain (Do not drive or operate heavy machinery while taking as can cause drowsiness.)., Starting Mon 12/02/2016, Print    sulfamethoxazole-trimethoprim (BACTRIM DS,SEPTRA DS) 800-160 MG tablet Take 1 tablet by mouth 2 (two) times daily., Starting Mon 12/02/2016, Until Mon 12/09/2016, Normal        Note: This dictation was prepared with Dragon dictation along with smaller phrase technology. Any transcriptional errors that result from this process are unintentional.         Renford Dills, NP 12/02/16 2137

## 2016-12-02 NOTE — ED Triage Notes (Signed)
Patient tolerated injection of Rocephin 1 gm w/o complaint.

## 2016-12-02 NOTE — Discharge Instructions (Signed)
Take medication as prescribed. Rest. Drink plenty of fluids. Keep clean.   Return to urgent care or follow up with Primary care in 2-3 days for wound check and packing removal.  Follow up with your primary care physician this week as needed. Return to Urgent care for new or worsening concerns.

## 2016-12-06 ENCOUNTER — Encounter: Payer: Self-pay | Admitting: Emergency Medicine

## 2016-12-06 ENCOUNTER — Ambulatory Visit
Admission: EM | Admit: 2016-12-06 | Discharge: 2016-12-06 | Disposition: A | Discharge status: Home / Self Care | Payer: Self-pay | Attending: Family Medicine | Admitting: Family Medicine

## 2016-12-06 DIAGNOSIS — H6001 Abscess of right external ear: Secondary | ICD-10-CM

## 2016-12-06 DIAGNOSIS — Z5189 Encounter for other specified aftercare: Secondary | ICD-10-CM

## 2016-12-06 LAB — AEROBIC CULTURE W GRAM STAIN (SUPERFICIAL SPECIMEN)

## 2016-12-06 LAB — AEROBIC CULTURE  (SUPERFICIAL SPECIMEN): CULTURE: NORMAL

## 2016-12-06 NOTE — ED Provider Notes (Addendum)
[facility] ____________________________________________  Time seen: Approximately 6:00 PM  I have reviewed the triage vital signs and the nursing notes.   HISTORY  Chief Complaint Wound Check   HPI Colleen Chapman is a 21 y.o. female presenting for reevaluation to abscess present behind right ear. Patient reports that she was seen in urgent care 4 days ago for this complaint, had the area opened and drained and was given a prescription for antibiotics. Patient states that she has not yet filled the prescription for antibiotics because she didn't have the money, but states now she does, and will fill them today. Patient states the area has improved, and the area is not as painful. States the area primarily only hurts when directly touched or accidentally hit. Patient states no more drainage. Denies fevers, any increased redness, swelling,neck pain, hearing changes, headache, or surrounding pain. Reports continues to eat and drink well. Patient reports otherwise feels well. Denies chest pain, shortness of breath, abdominal pain, or other skin changes. Denies recent sickness.   No LMP recorded. Patient has had an injection.Denies pregnancy.    Past Medical History:  Diagnosis Date  . ADHD (attention deficit hyperactivity disorder)    ongoing  . On Depo-Provera for contraception     Patient Active Problem List   Diagnosis Date Noted  . Indication for care in labor and delivery, antepartum 02/20/2016  . Postpartum care following vaginal delivery 07/31/2014  . Irregular contractions 07/30/2014  . Labor and delivery, indication for care 07/26/2014  . Indication for care in labor or delivery 07/22/2014    Past Surgical History:  Procedure Laterality Date  . NO PAST SURGERIES       No current facility-administered medications for this encounter.   Current Outpatient Prescriptions:  .  medroxyPROGESTERone (DEPO-PROVERA) 150 MG/ML injection, Inject 150 mg into the  muscle every 3 (three) months., Disp: , Rfl:  .  mupirocin ointment (BACTROBAN) 2 %, Apply three times a day for 7 days., Disp: 22 g, Rfl: 0 .  naproxen (NAPROSYN) 500 MG tablet, Take 1 tablet (500 mg total) by mouth 2 (two) times daily with a meal., Disp: 60 tablet, Rfl: 0 .  oxyCODONE-acetaminophen (ROXICET) 5-325 MG tablet, Take 1 tablet by mouth every 8 (eight) hours as needed for moderate pain or severe pain (Do not drive or operate heavy machinery while taking as can cause drowsiness.)., Disp: 4 tablet, Rfl: 0 .  sulfamethoxazole-trimethoprim (BACTRIM DS,SEPTRA DS) 800-160 MG tablet, Take 1 tablet by mouth 2 (two) times daily., Disp: 20 tablet, Rfl: 0  Allergies Patient has no known allergies.  Family History  Problem Relation Age of Onset  . Bipolar disorder Mother   . Diabetes Father   . Hypertension Father     Social History Social History  Substance Use Topics  . Smoking status: Never Smoker  . Smokeless tobacco: Never Used  . Alcohol use No    Review of Systems Constitutional: No fever/chills Eyes: No visual changes. ENT: No sore throat. Cardiovascular: Denies chest pain. Respiratory: Denies shortness of breath. Gastrointestinal: No abdominal pain.  No nausea, no vomiting.  No diarrhea.  No constipation. Genitourinary: Negative for dysuria. Musculoskeletal: Negative for back pain. Skin: As above.   ____________________________________________   PHYSICAL EXAM:  VITAL SIGNS: ED Triage Vitals  Enc Vitals Group     BP 12/06/16 1730 128/64     Pulse Rate 12/06/16 1730 66     Resp 12/06/16 1730 16     Temp 12/06/16 1730 97.8  F (36.6 C)     Temp Source 12/06/16 1730 Oral     SpO2 12/06/16 1730 100 %     Weight 12/06/16 1732 150 lb (68 kg)     Height 12/06/16 1732  (1.676 m)     Head Circumference --      Peak Flow --      Pain Score 12/06/16 1733 0     Pain Loc --      Pain Edu? --      Excl. in GC? --     Constitutional: Alert and oriented. Well  appearing and in no acute distress. Eyes: Conjunctivae are normal. PERRL. EOMI. ENT      Head: Normocephalic and atraumatic.      Ears: Left: Nontender, no erythema, normal TM. Right no auricle motion tenderness, normal canal and TM, no erythema. No mastoid tenderness bilaterally. No surrounding tenderness, swelling or erythema to left ear. Right post ear at ear crease wound with packing present, packing removed, and no purulent drainage, minimal immediate surrounding erythema, no fluctuance or induration, no drainage expressed, area mild tenderness to direct palpation at incision site, no mastoid tenderness or surrounding tenderness.      Nose: No congestion/rhinnorhea.      Mouth/Throat: Mucous membranes are moist.Oropharynx non-erythematous. Neck: No stridor. Supple without meningismus.  Hematological/Lymphatic/Immunilogical: No cervical lymphadenopathy. Cardiovascular: Normal rate, regular rhythm. Grossly normal heart sounds.  Good peripheral circulation. Respiratory: Normal respiratory effort without tachypnea nor retractions. Breath sounds are clear and equal bilaterally. No wheezes, rales, rhonchi. Musculoskeletal: Steady gait. Neurologic:  Normal speech and language.  Speech is normal. No gait instability.  Skin:  Skin is warm, dry. As above. Psychiatric: Mood and affect are normal. Speech and behavior are normal. Patient exhibits appropriate insight and judgment   ___________________________________________   LABS (all labs ordered are listed, but only abnormal results are displayed)  Labs Reviewed - No data to display  PROCEDURES Procedures  Procedure explained and verbal consent obtained.  Location right post auricular.packing present, packing removed. Patient tolerated well. Mild bleeding post packing removal, no purulent drainage, no induration, mild immediate surrounding erythema.  Area cleaned with Betadine and irrigated with saline by RN. Dressing then applied. Patient  tolerated well.    INITIAL IMPRESSION / ASSESSMENT AND PLAN / ED COURSE  Pertinent labs & imaging results that were available during my care of the patient were reviewed by me and considered in my medical decision making (see chart for details).  Well-appearing patient. No acute distress. Recent I&D of abscess, packing removed, patient tolerated well. Area appears to be improving, no indication for repacking. Area was cleaned, irrigated and dressing applied. Directed for patient to obtain prescription of oral Bactrim and topical Bactroban. Discussed close monitoring, wound care and strict follow up and return parameters. Discussed indication, risks and benefits of medications with patient.  Discussed follow up with Primary care physician this week. Discussed follow up and return parameters including no resolution or any worsening concerns. Patient verbalized understanding and agreed to plan.   ____________________________________________   FINAL CLINICAL IMPRESSION(S) / ED DIAGNOSES  Final diagnoses:  Wound check, abscess     Discharge Medication List as of 12/06/2016  5:57 PM      Note: This dictation was prepared with Dragon dictation along with smaller phrase technology. Any transcriptional errors that result from this process are unintentional.           Renford Dills, NP 12/06/16 2004

## 2016-12-06 NOTE — ED Triage Notes (Signed)
Here for wound recheck.  

## 2016-12-06 NOTE — Discharge Instructions (Signed)
Take medication as prescribed. Keep clean. Monitor.  ° °Follow up with your primary care physician this week as needed. Return to Urgent care for new or worsening concerns.  ° °

## 2016-12-17 ENCOUNTER — Ambulatory Visit
Admission: EM | Admit: 2016-12-17 | Discharge: 2016-12-17 | Disposition: A | Discharge status: Home / Self Care | Payer: Medicaid Other | Attending: Family Medicine | Admitting: Family Medicine

## 2016-12-17 ENCOUNTER — Encounter: Payer: Self-pay | Admitting: Emergency Medicine

## 2016-12-17 DIAGNOSIS — L0291 Cutaneous abscess, unspecified: Secondary | ICD-10-CM

## 2016-12-17 DIAGNOSIS — L270 Generalized skin eruption due to drugs and medicaments taken internally: Secondary | ICD-10-CM

## 2016-12-17 NOTE — Discharge Instructions (Signed)
Stop antibiotic.  See you Thursday.  Take care  Dr. Adriana Simas

## 2016-12-17 NOTE — ED Triage Notes (Signed)
Patient here today c/o rash all over her body that started today. Patient is on antibiotics now for a cyst behind her right ear. Patient states she has had a fever of 100 degrees today.

## 2016-12-17 NOTE — ED Provider Notes (Signed)
[facility]    CSN: [id number] Arrival date & time: 12/17/16  1829   History   Chief Complaint Chief Complaint  Patient presents with  . Rash   HPI  21 year old female presents with rash, body aches, and low-grade temperature. She also reports that she feels that her recent abscess is recurring.  Patient reports that she developed a rash today. Itchy and erythematous. Located all over. She's also had an elevated temperature, Tmax 100. She's had some diarrhea. Her daughter has had recent fever and diarrhea as well. She has recently been placed on Bactrim after having an incision and drainage. She is concerned that this is the culprit. No relieving factors.  Additionally, patient recently had an abscess that was drained on 10/1. Was thought to be an infected sebaceous cyst. She was placed on Bactrim. She states that she got her prescription late. She had a recheck on 10/5 in her packing was pulled. She states that she thinks it may have recurred as its puffy and tender.  Past Medical History:  Diagnosis Date  . ADHD (attention deficit hyperactivity disorder)    ongoing  . On Depo-Provera for contraception    Patient Active Problem List   Diagnosis Date Noted  . Indication for care in labor and delivery, antepartum 02/20/2016  . Postpartum care following vaginal delivery 07/31/2014  . Irregular contractions 07/30/2014  . Labor and delivery, indication for care 07/26/2014  . Indication for care in labor or delivery 07/22/2014   Past Surgical History:  Procedure Laterality Date  . NO PAST SURGERIES     OB History    Gravida Para Term Preterm AB Living   0 0 2   SAB TAB Ectopic Multiple Live Births   0 0 0 0 2     Home Medications    Prior to Admission medications   Medication Sig Start Date End Date Taking? Authorizing Provider  sulfamethoxazole-trimethoprim (BACTRIM DS,SEPTRA DS) 800-160 MG tablet Take 1 tablet by mouth 2 (two) times daily.   Yes  [provider]  medroxyPROGESTERone (DEPO-PROVERA) 150 MG/ML injection Inject 150 mg into the muscle every 3 (three) months.    [provider]  mupirocin ointment (BACTROBAN) 2 % Apply three times a day for 7 days. 12/02/16   Renford Dills, NP  naproxen (NAPROSYN) 500 MG tablet Take 1 tablet (500 mg total) by mouth 2 (two) times daily with a meal. 10/09/16   Lutricia Feil, PA-C   Family History Family History  Problem Relation Age of Onset  . Bipolar disorder Mother   . Diabetes Father   . Hypertension Father    Social History Social History  Substance Use Topics  . Smoking status: Never Smoker  . Smokeless tobacco: Never Used  . Alcohol use No   Allergies   Patient has no known allergies.  Review of Systems Review of Systems  Constitutional: Positive for fever.  Gastrointestinal: Positive for diarrhea.  Skin: Positive for rash.       Abscess.   Physical Exam Triage Vital Signs ED Triage Vitals  Enc Vitals Group     BP 12/17/16 1849 118/66     Pulse Rate 12/17/16 1849 (!) 102     Resp 12/17/16 1849 16     Temp 12/17/16 1849 99 F (37.2 C)     Temp Source 12/17/16 1849 Oral     SpO2 12/17/16 1849 100 %     Weight 12/17/16 1848 150 lb (68 kg)  Height 12/17/16 1848  (1.676 m)     Head Circumference --      Peak Flow --      Pain Score 12/17/16 1849 7     Pain Loc --      Pain Edu? --      Excl. in GC? --    Updated Vital Signs BP 118/66 (BP Location: Left Arm)   Pulse (!) 102   Temp 99 F (37.2 C) (Oral)   Resp 16   Ht  (1.676 m)   Wt 150 lb (68 kg)   LMP 12/17/2016 (Exact Date)   SpO2 100%   BMI 24.21 kg/m   Visual Acuity Right Eye Distance:   Left Eye Distance:   Bilateral Distance:    Right Eye Near:   Left Eye Near:    Bilateral Near:     Physical Exam  Constitutional: She is oriented to person, place, and time. She appears well-developed. No distress.  HENT:  Right ear - Area of erythema and fluctuance  noted at the postauricular crease (just behind the ear). Tender to palpation.  Cardiovascular: Normal rate and regular rhythm.   Pulmonary/Chest: Effort normal and breath sounds normal. She has no wheezes. She has no rales.  Neurological: She is alert and oriented to person, place, and time.  Skin:  Diffuse erythematous papular rash.   Psychiatric: She has a normal mood and affect.  Vitals reviewed.  UC Treatments / Results  Labs (all labs ordered are listed, but only abnormal results are displayed) Labs Reviewed - No data to display  EKG  EKG Interpretation None       Radiology No results found.  Procedures .Marland KitchenIncision and Drainage Date/Time: 12/17/2016 8:16 PM Performed by: Tommie Sams Authorized by: Tommie Sams   Consent:    Consent obtained:  Verbal   Consent given by:  Patient   Risks discussed:  Pain Location:    Type:  Abscess   Size:  ~1.5 cm   Location: Retroauricular/posterior auricular - Right. Pre-procedure details:    Skin preparation:  Betadine Anesthesia (see MAR for exact dosages):    Anesthesia method:  Local infiltration   Local anesthetic:  Lidocaine 1% WITH epi Procedure type:    Complexity:  Simple Procedure details:    Incision types:  Stab incision   Scalpel blade:  11   Drainage:  Bloody and purulent   Drainage amount:  Moderate   Packing materials:  1/4 in gauze Post-procedure details:    Patient tolerance of procedure:  Tolerated well, no immediate complications   (including critical care time)  Medications Ordered in UC Medications - No data to display   Initial Impression / Assessment and Plan / UC Course  I have reviewed the triage vital signs and the nursing notes.  Pertinent labs & imaging results that were available during my care of the patient were reviewed by me and considered in my medical decision making (see chart for details).     21 year old female presents with a likely drug reaction. Advised to stop  Bactrim. Additionally, she seems to have a recurrence of her abscess. This was drained today and packed. I am not placing her on antibiotics given her recent drug reaction. I will reevaluate her on Thursday.  Final Clinical Impressions(s) / UC Diagnoses   Final diagnoses:  Drug rash  Abscess   New Prescriptions Discharge Medication List as of 12/17/2016  8:01 PM     Controlled Substance Prescriptions [place]  Controlled Substance Registry consulted? Not Applicable   Tommie Sams, DO 12/17/16 2021

## 2016-12-19 ENCOUNTER — Emergency Department
Admission: EM | Admit: 2016-12-19 | Discharge: 2016-12-19 | Disposition: A | Discharge status: Home / Self Care | Payer: Medicaid Other | Attending: Emergency Medicine | Admitting: Emergency Medicine

## 2016-12-19 ENCOUNTER — Encounter: Payer: Self-pay | Admitting: Emergency Medicine

## 2016-12-19 DIAGNOSIS — Z79899 Other long term (current) drug therapy: Secondary | ICD-10-CM | POA: Insufficient documentation

## 2016-12-19 DIAGNOSIS — Z889 Allergy status to unspecified drugs, medicaments and biological substances status: Secondary | ICD-10-CM | POA: Insufficient documentation

## 2016-12-19 DIAGNOSIS — T7840XA Allergy, unspecified, initial encounter: Secondary | ICD-10-CM | POA: Insufficient documentation

## 2016-12-19 DIAGNOSIS — L509 Urticaria, unspecified: Secondary | ICD-10-CM | POA: Insufficient documentation

## 2016-12-19 MED ORDER — DIPHENHYDRAMINE HCL 25 MG PO CAPS
50.0000 mg | ORAL_CAPSULE | Freq: Once | ORAL | Status: AC
  Administered 2016-12-19: 50 mg via ORAL
  Filled 2016-12-19: qty 2

## 2016-12-19 MED ORDER — FAMOTIDINE 20 MG PO TABS
20.0000 mg | ORAL_TABLET | Freq: Once | ORAL | Status: AC
  Administered 2016-12-19: 20 mg via ORAL
  Filled 2016-12-19: qty 1

## 2016-12-19 MED ORDER — PREDNISONE 20 MG PO TABS: ORAL_TABLET | ORAL | 0 refills | Status: DC

## 2016-12-19 MED ORDER — CLINDAMYCIN HCL 300 MG PO CAPS: 300.0000 mg | ORAL_CAPSULE | Freq: Three times a day (TID) | ORAL | 0 refills | Status: DC

## 2016-12-19 MED ORDER — PREDNISONE 20 MG PO TABS
60.0000 mg | ORAL_TABLET | Freq: Once | ORAL | Status: AC
  Administered 2016-12-19: 60 mg via ORAL
  Filled 2016-12-19: qty 3

## 2016-12-19 MED ORDER — FAMOTIDINE 20 MG PO TABS: 20.0000 mg | ORAL_TABLET | Freq: Two times a day (BID) | ORAL | 0 refills | Status: DC

## 2016-12-19 NOTE — Discharge Instructions (Signed)
1. Stop taking sulfa antibiotic. Instead you may start clindamycin 300 mg 3 times daily 10 days. 2. Take prednisone 60 mg daily 4 days. 3. Take Pepcid 20 mg twice daily 4 days. 4. You may take Benadryl as needed for itching. 5. Return to the ER for worsening symptoms, persistent vomiting, difficulty breathing, swelling of face or tongue, or other concerns.

## 2016-12-19 NOTE — ED Notes (Signed)
Pt states that she has a painful rash all over her body. Says that she is "itchy and burns when I itch". She doesn't know of any allergies that she has to anything. Family at bedside.

## 2016-12-19 NOTE — ED Triage Notes (Signed)
Pt c/o rash all over body x2 days with fever. Pt reports she was seen at [facility] and was told to stop recent sulfa antibiotic pt was prescribed for a cyst behind ear.

## 2016-12-19 NOTE — ED Provider Notes (Signed)
[facility] Emergency Department Provider Note   ____________________________________________   First MD Initiated Contact with Patient 12/19/16 0308     (approximate)  I have reviewed the triage vital signs and the nursing notes.   HISTORY  Chief Complaint Rash    HPI Colleen Chapman is a 21 y.o. female who presents to the ED from home with a chief complaint of allergic reaction and hives. Patient had recent I&D of right ear abscess and placed on Bactrim.reports generalized itching and hives after starting antibiotic. Denies associated facial or tongue swelling, or respiratory distress. Denies fever, chills, chest pain, abdominal pain, nausea, vomiting. Denies recent travel or trauma. Has not taken anything prior to arrival.   Past Medical History:  Diagnosis Date  . ADHD (attention deficit hyperactivity disorder)    ongoing  . On Depo-Provera for contraception     Patient Active Problem List   Diagnosis Date Noted  . Indication for care in labor and delivery, antepartum 02/20/2016  . Postpartum care following vaginal delivery 07/31/2014  . Irregular contractions 07/30/2014  . Labor and delivery, indication for care 07/26/2014  . Indication for care in labor or delivery 07/22/2014    Past Surgical History:  Procedure Laterality Date  . NO PAST SURGERIES      Prior to Admission medications   Medication Sig Start Date End Date Taking? Authorizing Provider  clindamycin (CLEOCIN) 300 MG capsule Take 1 capsule (300 mg total) by mouth 3 (three) times daily. 12/19/16   Irean Hong, MD  famotidine (PEPCID) 20 MG tablet Take 1 tablet (20 mg total) by mouth 2 (two) times daily. 12/19/16   Irean Hong, MD  medroxyPROGESTERone (DEPO-PROVERA) 150 MG/ML injection Inject 150 mg into the muscle every 3 (three) months.    [provider]  mupirocin ointment (BACTROBAN) 2 % Apply three times a day for 7 days. 12/02/16   Renford Dills, NP    naproxen (NAPROSYN) 500 MG tablet Take 1 tablet (500 mg total) by mouth 2 (two) times daily with a meal. 10/09/16   Lutricia Feil, PA-C  predniSONE (DELTASONE) 20 MG tablet 3 tablets daily x 4 days 12/19/16   Irean Hong, MD  sulfamethoxazole-trimethoprim (BACTRIM DS,SEPTRA DS) 800-160 MG tablet Take 1 tablet by mouth 2 (two) times daily.    [provider]    Allergies Patient has no known allergies.  Family History  Problem Relation Age of Onset  . Bipolar disorder Mother   . Diabetes Father   . Hypertension Father     Social History Social History  Substance Use Topics  . Smoking status: Never Smoker  . Smokeless tobacco: Never Used  . Alcohol use No    Review of Systems  Constitutional: No fever/chills. Eyes: No visual changes. ENT: positive for right ear I&D. No sore throat. Cardiovascular: Denies chest pain. Respiratory: Denies shortness of breath. Gastrointestinal: No abdominal pain.  No nausea, no vomiting.  No diarrhea.  No constipation. Genitourinary: Negative for dysuria. Musculoskeletal: Negative for back pain. Skin: positive for rash. Neurological: Negative for headaches, focal weakness or numbness.   ____________________________________________   PHYSICAL EXAM:  VITAL SIGNS: ED Triage Vitals  Enc Vitals Group     BP 12/19/16 0046 116/75     Pulse Rate 12/19/16 0046 95     Resp 12/19/16 0046 18     Temp 12/19/16 0046 98.2 F (36.8 C)     Temp Source 12/19/16 0046 Oral     SpO2  12/19/16 0046 99 %     Weight 12/19/16 0043 150 lb (68 kg)     Height --      Head Circumference --      Peak Flow --      Pain Score 12/19/16 0233 0     Pain Loc --      Pain Edu? --      Excl. in GC? --     Constitutional: Alert and oriented. Well appearing and in no acute distress. Eyes: Conjunctivae are normal. PERRL. EOMI. Head: Atraumatic. Ears: Right posterior auricular area status post I&D with packing in place. Nose: No  congestion/rhinnorhea. Mouth/Throat: Mucous membranes are moist.  Oropharynx non-erythematous. There is no tongue or lip angioedema. Tolerating secretions well. Neck: No stridor.   Cardiovascular: Normal rate, regular rhythm. Grossly normal heart sounds.  Good peripheral circulation. Respiratory: Normal respiratory effort.  No retractions. Lungs CTAB. Gastrointestinal: Soft and nontender. No distention. No abdominal bruits. No CVA tenderness. Musculoskeletal: No lower extremity tenderness nor edema.  No joint effusions. Neurologic:  Normal speech and language. No gross focal neurologic deficits are appreciated. No gait instability. Skin:  Skin is warm, dry and intact. Generalized urticaria noted. No petechiae. Psychiatric: Mood and affect are normal. Speech and behavior are normal.  ____________________________________________   LABS (all labs ordered are listed, but only abnormal results are displayed)  Labs Reviewed - No data to display ____________________________________________  EKG  None ____________________________________________  RADIOLOGY  No results found.  ____________________________________________   PROCEDURES  Procedure(s) performed: None  Procedures  Critical Care performed: No  ____________________________________________   INITIAL IMPRESSION / ASSESSMENT AND PLAN / ED COURSE  As part of my medical decision making, I reviewed the following data within the electronic MEDICAL RECORD NUMBER History obtained from family, Nursing notes reviewed and incorporated and Notes from prior ED visits.   21 year old female who presents with generalized urticaria, most likely secondary to Bactrim. Will initiate prednisone, Pepcid and Benadryl. Will prescribe clindamycin. Patient has follow-up appointment with her doctor later today. She will wait to fill her antibiotic prescription until after her appointment if her PCP wishes to start clindamycin. Strict return  precautions given. Patient verbalizes understanding and agrees with plan of care.      ____________________________________________   FINAL CLINICAL IMPRESSION(S) / ED DIAGNOSES  Final diagnoses:  Allergic reaction, initial encounter  Urticaria  Drug allergy      NEW MEDICATIONS STARTED DURING THIS VISIT:  New Prescriptions   CLINDAMYCIN (CLEOCIN) 300 MG CAPSULE    Take 1 capsule (300 mg total) by mouth 3 (three) times daily.   FAMOTIDINE (PEPCID) 20 MG TABLET    Take 1 tablet (20 mg total) by mouth 2 (two) times daily.   PREDNISONE (DELTASONE) 20 MG TABLET    3 tablets daily x 4 days     Note:  This document was prepared using Dragon voice recognition software and may include unintentional dictation errors.    Irean HongSung, Jade J, MD 12/19/16 [phone]

## 2016-12-20 ENCOUNTER — Ambulatory Visit
Admission: EM | Admit: 2016-12-20 | Discharge: 2016-12-20 | Disposition: A | Discharge status: Home / Self Care | Payer: Self-pay | Attending: Family Medicine | Admitting: Family Medicine

## 2016-12-20 ENCOUNTER — Encounter: Payer: Self-pay | Admitting: Emergency Medicine

## 2016-12-20 DIAGNOSIS — Z5189 Encounter for other specified aftercare: Secondary | ICD-10-CM

## 2016-12-20 NOTE — ED Provider Notes (Signed)
[facility]    CSN: [id number] Arrival date & time: 12/20/16  [id number]  History   Chief Complaint Chief Complaint  Patient presents with  . Wound Check   HPI  21 year old female presents for a wound check.  This has been drained twice now. I saw her and performed an I&D on 10/16. She states that she's doing well regarding this. She was recently seen in the ER after being seen here. She states that her rash had worsened due to her recent drug reaction. She states she is improving. Pain is improved regarding her wound. She states that things seem to be going well. No fevers or chills.  Past Medical History:  Diagnosis Date  . ADHD (attention deficit hyperactivity disorder)    ongoing  . On Depo-Provera for contraception     Patient Active Problem List   Diagnosis Date Noted  . Indication for care in labor and delivery, antepartum 02/20/2016  . Postpartum care following vaginal delivery 07/31/2014  . Irregular contractions 07/30/2014  . Labor and delivery, indication for care 07/26/2014  . Indication for care in labor or delivery 07/22/2014    Past Surgical History:  Procedure Laterality Date  . NO PAST SURGERIES      OB History    Gravida Para Term Preterm AB Living   2 2 2  0 0 2   SAB TAB Ectopic Multiple Live Births   0 0 0 0 2       Home Medications    Prior to Admission medications   Medication Sig Start Date End Date Taking? Authorizing Provider  clindamycin (CLEOCIN) 300 MG capsule Take 1 capsule (300 mg total) by mouth 3 (three) times daily. 12/19/16   Irean HongSung, Jade J, MD  famotidine (PEPCID) 20 MG tablet Take 1 tablet (20 mg total) by mouth 2 (two) times daily. 12/19/16   Irean HongSung, Jade J, MD  medroxyPROGESTERone (DEPO-PROVERA) 150 MG/ML injection Inject 150 mg into the muscle every 3 (three) months.    [provider]  mupirocin ointment (BACTROBAN) 2 % Apply three times a day for 7 days. 12/02/16   Renford DillsMiller, Lindsey, NP  naproxen (NAPROSYN) 500  MG tablet Take 1 tablet (500 mg total) by mouth 2 (two) times daily with a meal. 10/09/16   Lutricia Feiloemer, William P, PA-C  predniSONE (DELTASONE) 20 MG tablet 3 tablets daily x 4 days 12/19/16   Irean HongSung, Jade J, MD    Family History Family History  Problem Relation Age of Onset  . Bipolar disorder Mother   . Diabetes Father   . Hypertension Father     Social History Social History  Substance Use Topics  . Smoking status: Never Smoker  . Smokeless tobacco: Never Used  . Alcohol use No     Allergies   Bactrim [sulfamethoxazole-trimethoprim]   Review of Systems Review of Systems  Constitutional: Negative.   Skin: Positive for wound.   Physical Exam Triage Vital Signs ED Triage Vitals  Enc Vitals Group     BP 12/20/16 0944 123/73     Pulse Rate 12/20/16 0944 80     Resp 12/20/16 0944 14     Temp 12/20/16 0944 98.1 F (36.7 C)     Temp Source 12/20/16 0944 Oral     SpO2 12/20/16 0944 100 %     Weight 12/20/16 0941 150 lb (68 kg)     Height 12/20/16 0941 5\' 6"  (1.676 m)     Head Circumference --      Peak  Flow --      Pain Score 12/20/16 0941 0     Pain Loc --      Pain Edu? --      Excl. in GC? --    Updated Vital Signs BP 123/73 (BP Location: Left Arm)   Pulse 80   Temp 98.1 F (36.7 C) (Oral)   Resp 14   Ht 5\' 6"  (1.676 m)   Wt 150 lb (68 kg)   LMP 12/17/2016 (Exact Date)   SpO2 100%   Breastfeeding? No   BMI 24.21 kg/m     Physical Exam  Constitutional: She appears well-developed. No distress.  Skin:  Right ear (posterior/retroauricular region) - packing removed. Wound is now shallow. Wound was irrigated with saline. No packing needed.  Vitals reviewed.  UC Treatments / Results  Labs (all labs ordered are listed, but only abnormal results are displayed) Labs Reviewed - No data to display  EKG  EKG Interpretation None       Radiology No results found.  Procedures Procedures (including critical care time)  Medications Ordered in  UC Medications - No data to display   Initial Impression / Assessment and Plan / UC Course  I have reviewed the triage vital signs and the nursing notes.  Pertinent labs & imaging results that were available during my care of the patient were reviewed by me and considered in my medical decision making (see chart for details).    21 year old female presents for wound check. Healing well. Packing removed. Irrigated today. No indication for further packing as wound is healing up properly and is quite shallow (ie now superificial enough that pack is not needed).  Final Clinical Impressions(s) / UC Diagnoses   Final diagnoses:  Visit for wound check   New Prescriptions Discharge Medication List as of 12/20/2016 10:12 AM     Controlled Substance Prescriptions [place] Controlled Substance Registry consulted? Not Applicable   Tommie Sams, DO 12/20/16 1610

## 2016-12-20 NOTE — ED Triage Notes (Signed)
Patient here for wound check behind right ear.  Patient reports minimal drainage.  Patient denies fevers.  Patient on a different antibiotic.

## 2016-12-20 NOTE — Discharge Instructions (Signed)
Routine care. ° °Take care ° °Dr. Terel Bann  °

## 2017-02-26 IMAGING — US US OB COMP LESS 14 WK
1 series · 14 of 28 positions shown · non-contrast
Comparison: None.

CLINICAL DATA: Vaginal bleeding and cramping for 6 hours, unsure
LMP.

EXAM:
OBSTETRIC <14 WK US AND TRANSVAGINAL OB US
TECHNIQUE: Both transabdominal and transvaginal ultrasound examinations were
performed for complete evaluation of the gestation as well as the
maternal uterus, adnexal regions, and pelvic cul-de-sac.
Transvaginal technique was performed to assess early pregnancy.

[Series 1: us ob comp less 14 wk · 0.15mm/px · 14 of 127 slices shown]
[im 5/127]
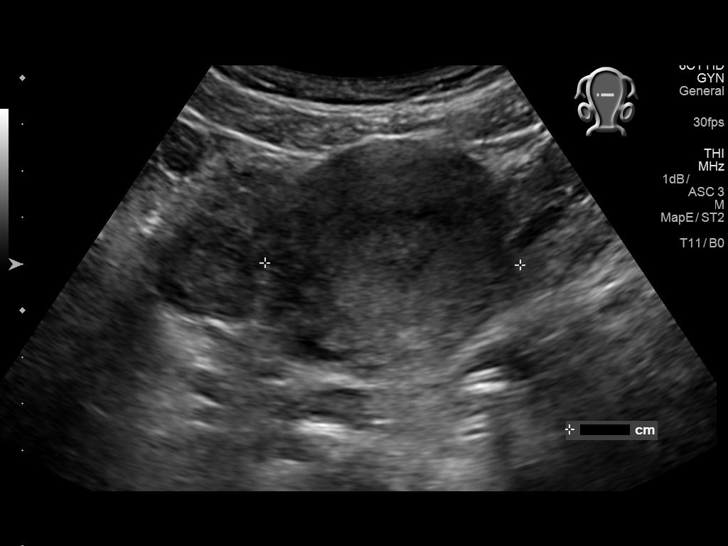
[im 15/127]
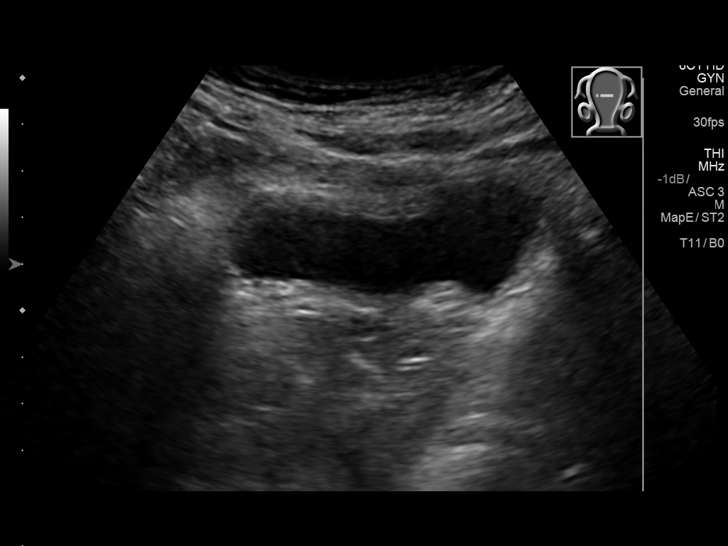
[im 24/127]
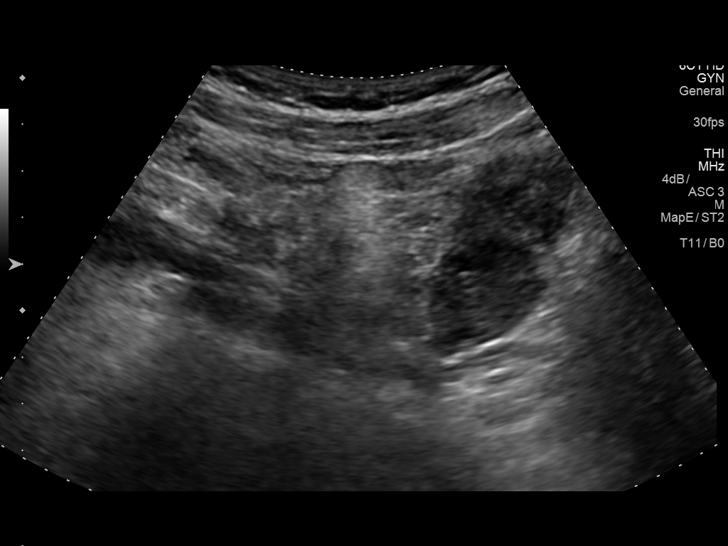
[im 33/127]
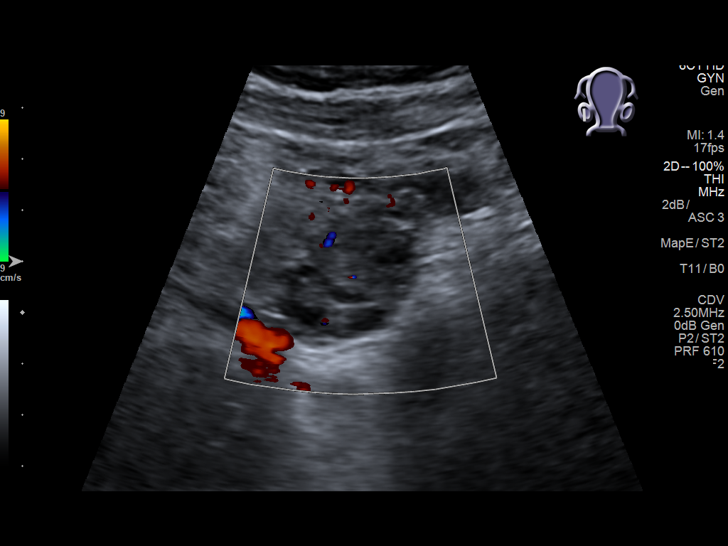
[im 43/127]
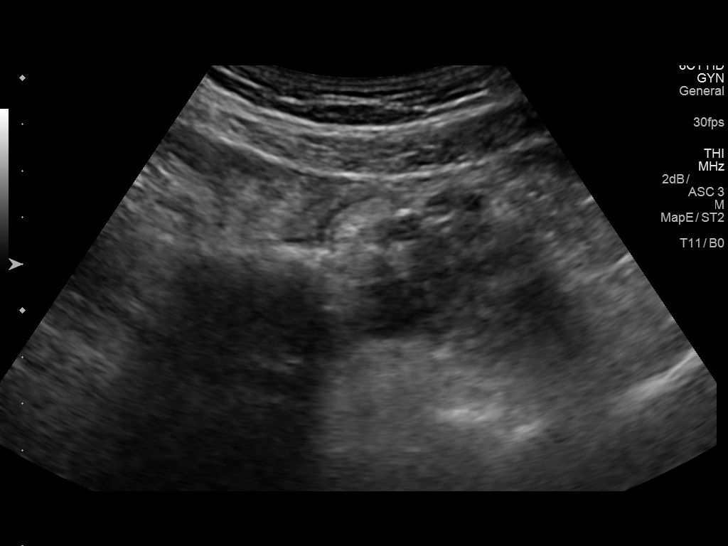
[im 52/127]
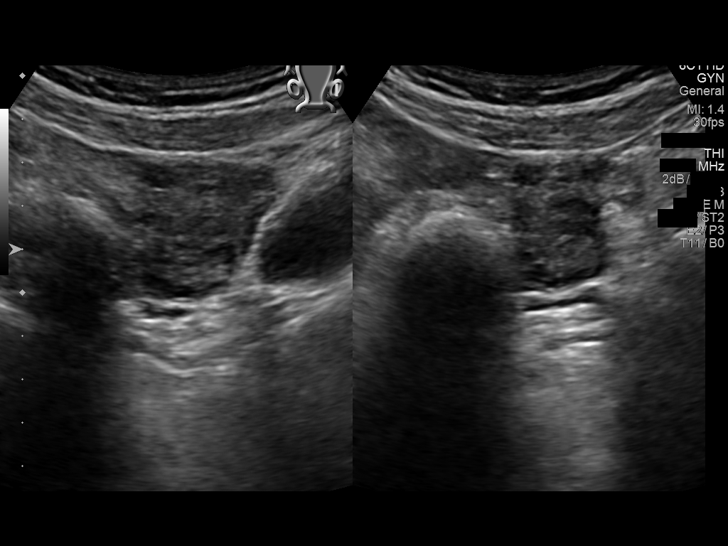
[im 61/127]
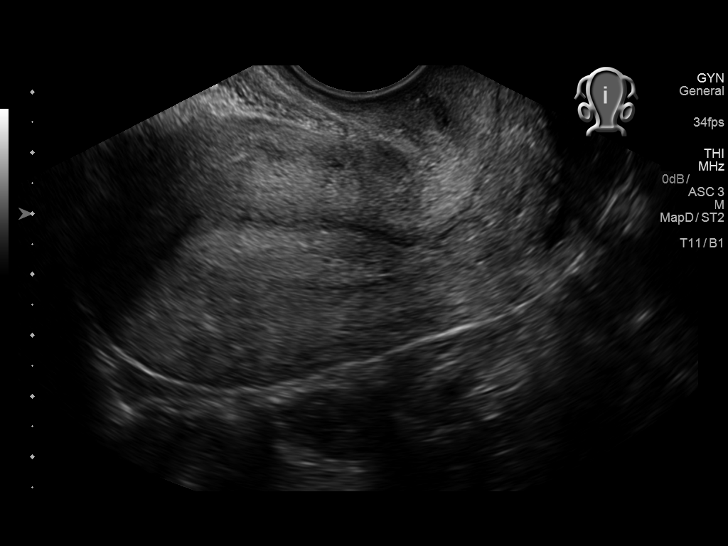
[im 71/127]
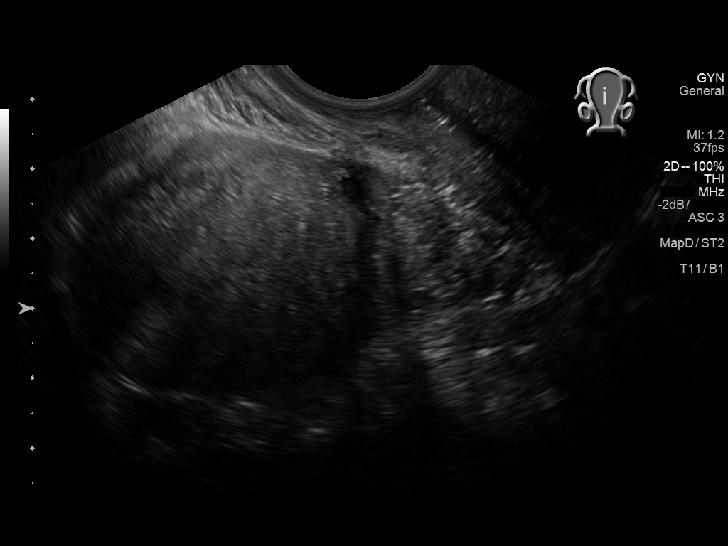
[im 80/127]
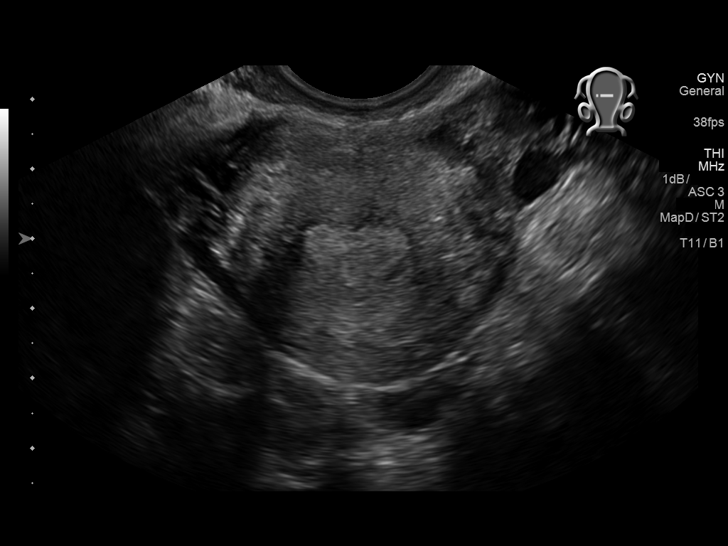
[im 89/127]
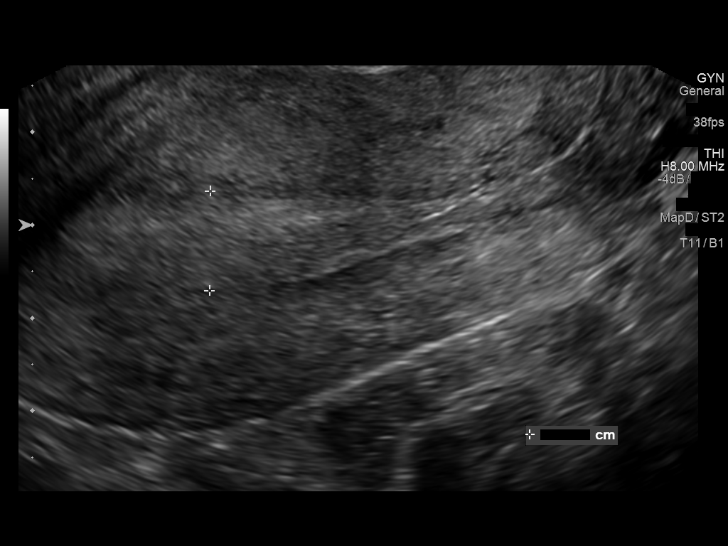
[im 99/127]
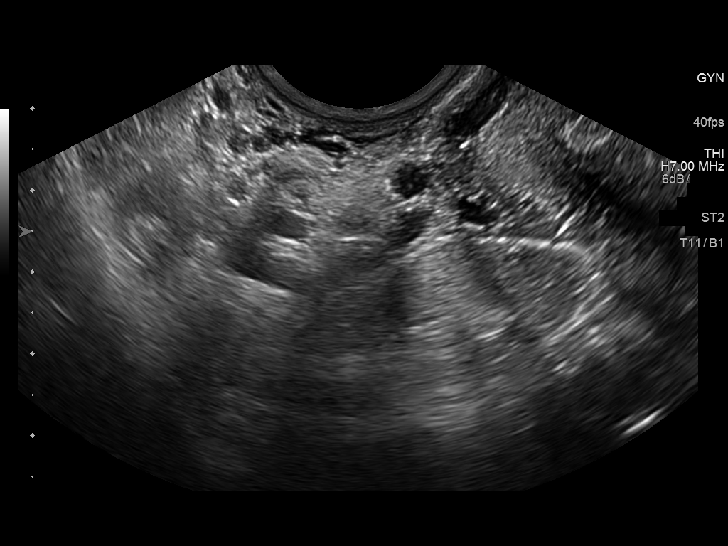
[im 108/127]
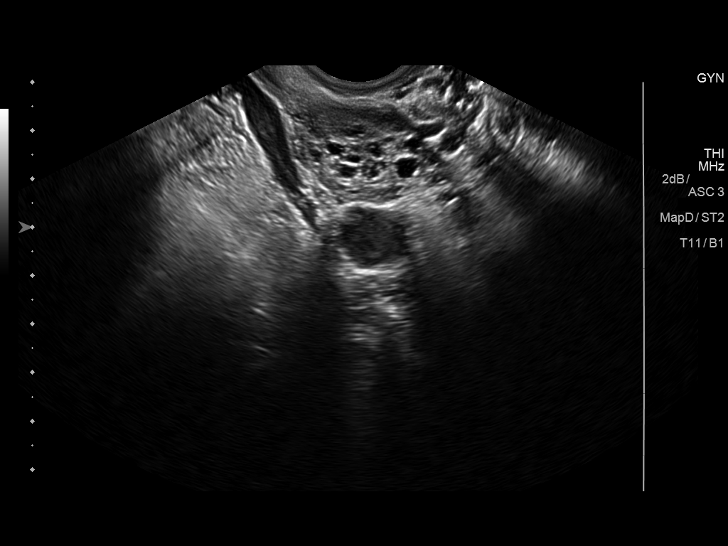
[im 117/127]
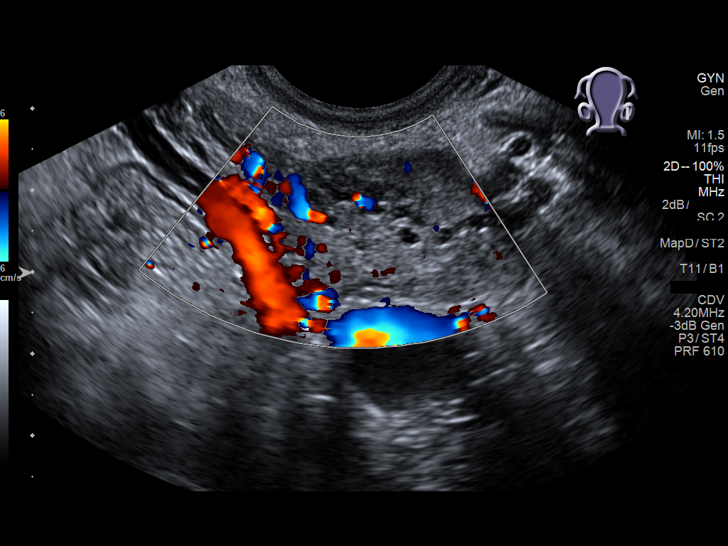
[im 127/127]
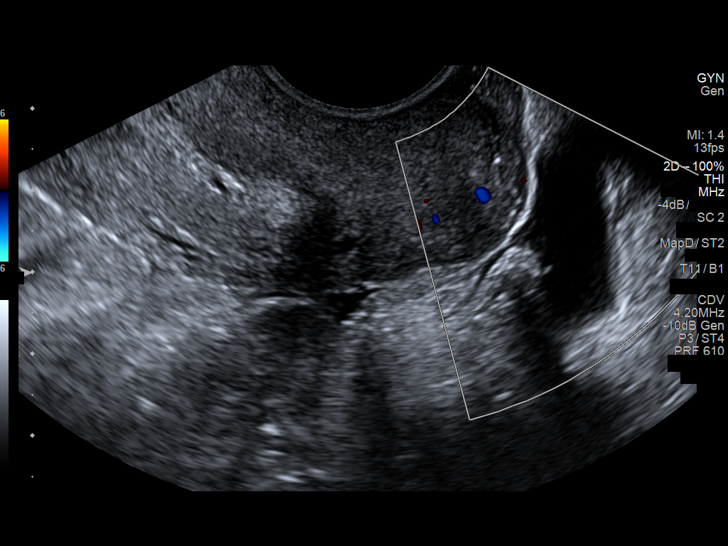

[14 of 28 positions shown; findings below may reference images not displayed]

FINDINGS: Intrauterine gestational sac: None seen.

Yolk sac:  Not seen

Embryo:  Not seen

Cardiac Activity: Not seen

uterus/adnexae: Uterus appears normal. Endometrium appears
homogeneous in thickness throughout with a normal demonstrated
measurement of 11 mm. No mass or fluid within the endometrial canal.
As above, no intrauterine pregnancy.

Ovaries appear normal and there is no mass or free fluid seen within
either adnexal region. Small amount of simple- appearing free fluid
in the cul-de-sac is likely physiologic in nature.
IMPRESSION: Normal pelvic ultrasound.  No intrauterine pregnancy seen.

## 2017-12-09 ENCOUNTER — Other Ambulatory Visit: Payer: Self-pay

## 2017-12-09 ENCOUNTER — Ambulatory Visit
Admission: EM | Admit: 2017-12-09 | Discharge: 2017-12-09 | Disposition: A | Discharge status: Home / Self Care | Payer: Medicaid Other | Attending: Family Medicine | Admitting: Family Medicine

## 2017-12-09 ENCOUNTER — Encounter: Payer: Self-pay | Admitting: Emergency Medicine

## 2017-12-09 DIAGNOSIS — Z202 Contact with and (suspected) exposure to infections with a predominantly sexual mode of transmission: Secondary | ICD-10-CM

## 2017-12-09 DIAGNOSIS — Z882 Allergy status to sulfonamides status: Secondary | ICD-10-CM | POA: Insufficient documentation

## 2017-12-09 LAB — CHLAMYDIA/NGC RT PCR (ARMC ONLY)
CHLAMYDIA TR: NOT DETECTED
N GONORRHOEAE: NOT DETECTED

## 2017-12-09 NOTE — ED Provider Notes (Signed)
[facility]    CSN: [id number] Arrival date & time: 12/09/17  1547     History   Chief Complaint Chief Complaint  Patient presents with  . Exposure to STD    HPI Colleen Chapman is a 22 y.o. female.   HPI  22 year old female presents to be tested for STDs.  He has a new relationship with a new partner.  States she is not using protective sex.  Is any symptoms.  Has had no pain no discharge no sores.  She is currently on her period she states that she has heard "rumors" the new partner has a disease.  She is being cautious by having the laboratory run.        Past Medical History:  Diagnosis Date  . ADHD (attention deficit hyperactivity disorder)    ongoing  . On Depo-Provera for contraception     Patient Active Problem List   Diagnosis Date Noted  . Indication for care in labor and delivery, antepartum 02/20/2016  . Postpartum care following vaginal delivery 07/31/2014  . Irregular contractions 07/30/2014  . Labor and delivery, indication for care 07/26/2014  . Indication for care in labor or delivery 07/22/2014    Past Surgical History:  Procedure Laterality Date  . NO PAST SURGERIES      OB History    Gravida  2   Para  2   Term  2   Preterm  0   AB  0   Living  2     SAB  0   TAB  0   Ectopic  0   Multiple  0   Live Births  2            Home Medications    Prior to Admission medications   Not on File    Family History Family History  Problem Relation Age of Onset  . Bipolar disorder Mother   . Diabetes Father   . Hypertension Father     Social History Social History   Tobacco Use  . Smoking status: Never Smoker  . Smokeless tobacco: Never Used  Substance Use Topics  . Alcohol use: No  . Drug use: No     Allergies   Bactrim [sulfamethoxazole-trimethoprim]   Review of Systems Review of Systems  Constitutional: Positive for activity change. Negative for chills, fatigue and fever.    Genitourinary: Negative for dyspareunia, dysuria, frequency, genital sores, pelvic pain, urgency, vaginal discharge and vaginal pain.  All other systems reviewed and are negative.    Physical Exam Triage Vital Signs ED Triage Vitals  Enc Vitals Group     BP 12/09/17 1603 118/75     Pulse Rate 12/09/17 1603 88     Resp 12/09/17 1603 16     Temp 12/09/17 1603 98.1 F (36.7 C)     Temp Source 12/09/17 1603 Oral     SpO2 12/09/17 1603 98 %     Weight 12/09/17 1603 140 lb (63.5 kg)     Height 12/09/17 1603 5\' 6"  (1.676 m)     Head Circumference --      Peak Flow --      Pain Score 12/09/17 1601 0     Pain Loc --      Pain Edu? --      Excl. in GC? --    No data found.  Updated Vital Signs BP 118/75 (BP Location: Left Arm)   Pulse 88   Temp 98.1 F (36.7  C) (Oral)   Resp 16   Ht 5\' 6"  (1.676 m)   Wt 140 lb (63.5 kg)   LMP 12/05/2017 (Exact Date)   SpO2 98%   BMI 22.60 kg/m   Visual Acuity Right Eye Distance:   Left Eye Distance:   Bilateral Distance:    Right Eye Near:   Left Eye Near:    Bilateral Near:     Physical Exam  Constitutional: She is oriented to person, place, and time. She appears well-developed and well-nourished. No distress.  HENT:  Head: Normocephalic.  Eyes: Pupils are equal, round, and reactive to light.  Neck: Normal range of motion.  Musculoskeletal: Normal range of motion.  Neurological: She is alert and oriented to person, place, and time.  Skin: Skin is warm and dry. She is not diaphoretic.  Psychiatric: She has a normal mood and affect. Her behavior is normal. Judgment and thought content normal.  Nursing note and vitals reviewed.    UC Treatments / Results  Labs (all labs ordered are listed, but only abnormal results are displayed) Labs Reviewed  CHLAMYDIA/NGC RT PCR ([facility] ONLY)  RPR  HIV ANTIBODY (ROUTINE TESTING W REFLEX)    EKG None  Radiology No results found.  Procedures Procedures (including critical  care time)  Medications Ordered in UC Medications - No data to display  Initial Impression / Assessment and Plan / UC Course  I have reviewed the triage vital signs and the nursing notes.  Pertinent labs & imaging results that were available during my care of the patient were reviewed by me and considered in my medical decision making (see chart for details).     The patient currently is asymptomatic.  She is concerned because she has started a new relationship and is uncertain of any diseases he may be bringing to the relationship.  I have sent a dirty urine for nucleic acid amplification for GC and chlamydia.  Also wanted to have RPR and HIV laboratory.  Been obtained and sent for analysis.  I have talked with her regarding safe sexual practices and given her information of same.  Told her that she will be notified only for positive results and if she does not hear of any results that the she can assume they are negative. Final Clinical Impressions(s) / UC Diagnoses   Final diagnoses:  Possible exposure to STD   Discharge Instructions   None    ED Prescriptions    None     Controlled Substance Prescriptions [place] Controlled Substance Registry consulted? Not Applicable   Lutricia Feil, PA-C 12/09/17 1719

## 2017-12-09 NOTE — ED Triage Notes (Signed)
Pt here today to be tested for STD's she is having sex with a new partner and would like to be checked. She is not having any symptoms. Pt is currently on her period.

## 2017-12-10 LAB — HIV ANTIBODY (ROUTINE TESTING W REFLEX): HIV Screen 4th Generation wRfx: NONREACTIVE

## 2017-12-10 LAB — RPR: RPR Ser Ql: NONREACTIVE

## 2018-01-02 HISTORY — PX: OTHER SURGICAL HISTORY: SHX169

## 2018-02-26 ENCOUNTER — Other Ambulatory Visit: Payer: Self-pay

## 2018-02-26 ENCOUNTER — Ambulatory Visit
Admission: EM | Admit: 2018-02-26 | Discharge: 2018-02-26 | Disposition: A | Discharge status: Home / Self Care | Payer: Medicaid Other | Attending: Family Medicine | Admitting: Family Medicine

## 2018-02-26 ENCOUNTER — Emergency Department
Admission: EM | Admit: 2018-02-26 | Discharge: 2018-02-26 | Disposition: A | Discharge status: Home / Self Care | Payer: Medicaid Other | Attending: Emergency Medicine | Admitting: Emergency Medicine

## 2018-02-26 ENCOUNTER — Encounter: Payer: Self-pay | Admitting: Emergency Medicine

## 2018-02-26 ENCOUNTER — Emergency Department: Payer: Medicaid Other

## 2018-02-26 DIAGNOSIS — O034 Incomplete spontaneous abortion without complication: Secondary | ICD-10-CM

## 2018-02-26 DIAGNOSIS — Z9889 Other specified postprocedural states: Secondary | ICD-10-CM

## 2018-02-26 DIAGNOSIS — Z3201 Encounter for pregnancy test, result positive: Secondary | ICD-10-CM | POA: Insufficient documentation

## 2018-02-26 LAB — CBC
HCT: 44.1 % (ref 36.0–46.0)
HEMOGLOBIN: 14.8 g/dL (ref 12.0–15.0)
MCH: 30.7 pg (ref 26.0–34.0)
MCHC: 33.6 g/dL (ref 30.0–36.0)
MCV: 91.5 fL (ref 80.0–100.0)
PLATELETS: 290 10*3/uL (ref 150–400)
RBC: 4.82 MIL/uL (ref 3.87–5.11)
RDW: 12 % (ref 11.5–15.5)
WBC: 8.9 10*3/uL (ref 4.0–10.5)
nRBC: 0 % (ref 0.0–0.2)

## 2018-02-26 LAB — HCG, QUANTITATIVE, PREGNANCY: hCG, Beta Chain, Quant, S: 50 m[IU]/mL — ABNORMAL HIGH (ref <5)

## 2018-02-26 LAB — PREGNANCY, URINE: PREG TEST UR: POSITIVE — AB

## 2018-02-26 NOTE — Discharge Instructions (Addendum)
Recommend patient go to Emergency Department for further evaluation and management °

## 2018-02-26 NOTE — Discharge Instructions (Addendum)
You still have some remnants of the pregnancy in your uterus.  This is referred to as retained products of conception (POC's).    You need to follow-up with an OB/GYN within the next week.  [facility] or [facility].  [facility] has evening hours on certain days.  Return to the ER for pain, bleeding, fever, weakness, or any other new or worsening symptoms that concern you.

## 2018-02-26 NOTE — ED Triage Notes (Signed)
Patient states she had an abortion 1 month ago and was told to take a pregnancy test on 12/21. The pregnancy test was positive on 12/21. She has had unprotected sex since her abortion 1 month ago.

## 2018-02-26 NOTE — ED Provider Notes (Signed)
[facility]    CSN: [id number] Arrival date & time: 02/26/18  1602     History   Chief Complaint Chief Complaint  Patient presents with  . Possible Pregnancy    HPI Vicente SereneKrista Gordon Dupre is a 22 y.o. female.   22 yo female here for pregnancy test. Patient states she had an induced abortion on November 24 and was told to take a pregnancy test on December 21 and if positive that she would need follow up. Patient did the pregnancy test then and it was positive, however she did not follow up with GYN. Denies any bleeding or pain.   The history is provided by the patient.  Possible Pregnancy     Past Medical History:  Diagnosis Date  . ADHD (attention deficit hyperactivity disorder)    ongoing  . On Depo-Provera for contraception     Patient Active Problem List   Diagnosis Date Noted  . Indication for care in labor and delivery, antepartum 02/20/2016  . Postpartum care following vaginal delivery 07/31/2014  . Irregular contractions 07/30/2014  . Labor and delivery, indication for care 07/26/2014  . Indication for care in labor or delivery 07/22/2014    Past Surgical History:  Procedure Laterality Date  . NO PAST SURGERIES      OB History    Gravida  2   Para  2   Term  2   Preterm  0   AB  0   Living  2     SAB  0   TAB  0   Ectopic  0   Multiple  0   Live Births  2            Home Medications    Prior to Admission medications   Not on File    Family History Family History  Problem Relation Age of Onset  . Bipolar disorder Mother   . Diabetes Father   . Hypertension Father     Social History Social History   Tobacco Use  . Smoking status: Never Smoker  . Smokeless tobacco: Never Used  Substance Use Topics  . Alcohol use: Yes  . Drug use: No     Allergies   Bactrim [sulfamethoxazole-trimethoprim]   Review of Systems Review of Systems   Physical Exam Triage Vital Signs ED Triage Vitals  Enc Vitals  Group     BP 02/26/18 1649 115/69     Pulse Rate 02/26/18 1649 63     Resp 02/26/18 1649 18     Temp 02/26/18 1649 98.1 F (36.7 C)     Temp Source 02/26/18 1649 Oral     SpO2 02/26/18 1649 100 %     Weight 02/26/18 1648 150 lb (68 kg)     Height 02/26/18 1648 5\' 6"  (1.676 m)     Head Circumference --      Peak Flow --      Pain Score 02/26/18 1648 0     Pain Loc --      Pain Edu? --      Excl. in GC? --    No data found.  Updated Vital Signs BP 115/69 (BP Location: Right Arm)   Pulse 63   Temp 98.1 F (36.7 C) (Oral)   Resp 18   Ht 5\' 6"  (1.676 m)   Wt 68 kg   SpO2 100%   BMI 24.21 kg/m   Visual Acuity Right Eye Distance:   Left Eye Distance:   Bilateral  Distance:    Right Eye Near:   Left Eye Near:    Bilateral Near:     Physical Exam Vitals signs and nursing note reviewed.  Constitutional:      General: She is not in acute distress.    Appearance: She is not ill-appearing, toxic-appearing or diaphoretic.  Abdominal:     General: There is no distension.     Palpations: Abdomen is soft.  Neurological:     Mental Status: She is alert.      UC Treatments / Results  Labs (all labs ordered are listed, but only abnormal results are displayed) Labs Reviewed  PREGNANCY, URINE - Abnormal; Notable for the following components:      Result Value   Preg Test, Ur POSITIVE (*)    All other components within normal limits    EKG None  Radiology No results found.  Procedures Procedures (including critical care time)  Medications Ordered in UC Medications - No data to display  Initial Impression / Assessment and Plan / UC Course  I have reviewed the triage vital signs and the nursing notes.  Pertinent labs & imaging results that were available during my care of the patient were reviewed by me and considered in my medical decision making (see chart for details).      Final Clinical Impressions(s) / UC Diagnoses   Final diagnoses:  Positive  pregnancy test  History of induced abortion     Discharge Instructions     Recommend patient go to Emergency Department for further evaluation and management    ED Prescriptions    None     1. Lab results and diagnosis reviewed with patient; unknown if retained products from induced abortion one month ago vs new pregnancy; recommend patient go to Emergency Department for further evaluation and management.   Controlled Substance Prescriptions [place] Controlled Substance Registry consulted? Not Applicable   Payton Mccallumonty, Astaria Nanez, MD 02/26/18 [phone]

## 2018-02-26 NOTE — ED Notes (Signed)
Pt states she took an at home pregnancy test that was positive after having elective abortion. Pt was referred here by [facility] urgent care. Pt NAD at this time. This RN will continue to monitor.

## 2018-02-26 NOTE — ED Triage Notes (Signed)
Pt arrives to ED via POV from home requesting an Ultrasound to r/o retained products of conception following an elective abortion on Nov 24th. Pt states she was referred here from [facility]. Pt denies vaginal bleeding, no abdominal pain. No c/o N/V/D or fever.

## 2018-02-26 NOTE — ED Notes (Signed)
ED Provider at bedside. 

## 2018-02-26 NOTE — ED Provider Notes (Signed)
[facility] Emergency Department Provider Note ____________________________________________   First MD Initiated Contact with Patient 02/26/18 2215     (approximate)  I have reviewed the triage vital signs and the nursing notes.   HISTORY  Chief Complaint Follow-up    HPI Colleen Chapman is a 22 y.o. female with PMH as noted below who presents for concern of retained products of conception after an abortion last month.  The patient states that she had an elective abortion on 01/25/2018.  She was told to check a pregnancy test on 12/21 and it was positive.  She went to urgent care and was sent to the emergency department.  The patient denies any vaginal bleeding, abdominal pain, fever, vomiting, or any other symptoms.  Past Medical History:  Diagnosis Date  . ADHD (attention deficit hyperactivity disorder)    ongoing  . On Depo-Provera for contraception     Patient Active Problem List   Diagnosis Date Noted  . Indication for care in labor and delivery, antepartum 02/20/2016  . Postpartum care following vaginal delivery 07/31/2014  . Irregular contractions 07/30/2014  . Labor and delivery, indication for care 07/26/2014  . Indication for care in labor or delivery 07/22/2014    Past Surgical History:  Procedure Laterality Date  . NO PAST SURGERIES      Prior to Admission medications   Not on File    Allergies Bactrim [sulfamethoxazole-trimethoprim]  Family History  Problem Relation Age of Onset  . Bipolar disorder Mother   . Diabetes Father   . Hypertension Father     Social History Social History   Tobacco Use  . Smoking status: Never Smoker  . Smokeless tobacco: Never Used  Substance Use Topics  . Alcohol use: Yes  . Drug use: No    Review of Systems  Constitutional: No fever. Eyes: No redness. Cardiovascular: Denies chest pain. Respiratory: Denies shortness of breath. Gastrointestinal: No vomiting or diarrhea.    Genitourinary: Negative for dysuria.  Negative for vaginal bleeding. Musculoskeletal: Negative for back pain. Skin: Negative for rash. Neurological: Negative for headache.   ____________________________________________   PHYSICAL EXAM:  VITAL SIGNS: ED Triage Vitals  Enc Vitals Group     BP 02/26/18 1921 130/65     Pulse Rate 02/26/18 1921 65     Resp 02/26/18 1921 18     Temp 02/26/18 1921 97.6 F (36.4 C)     Temp Source 02/26/18 1921 Oral     SpO2 02/26/18 1921 100 %     Weight 02/26/18 1922 150 lb (68 kg)     Height 02/26/18 1922 5\' 6"  (1.676 m)     Head Circumference --      Peak Flow --      Pain Score 02/26/18 1925 0     Pain Loc --      Pain Edu? --      Excl. in GC? --     Constitutional: Alert and oriented. Well appearing and in no acute distress. Eyes: Conjunctivae are normal.  Head: Atraumatic. Nose: No congestion/rhinnorhea. Mouth/Throat: Mucous membranes are moist.   Neck: Normal range of motion.  Cardiovascular: Good peripheral circulation. Respiratory: Normal respiratory effort.  Gastrointestinal: Soft and nontender. No distention.  Musculoskeletal:  Extremities warm and well perfused.  Neurologic:  Normal speech and language. No gross focal neurologic deficits are appreciated.  Skin:  Skin is warm and dry. No rash noted. Psychiatric: Mood and affect are normal. Speech and behavior are normal.  ____________________________________________  LABS (all labs ordered are listed, but only abnormal results are displayed)  Labs Reviewed  HCG, QUANTITATIVE, PREGNANCY - Abnormal; Notable for the following components:      Result Value   hCG, Beta Chain, Quant, S 50 (*)    All other components within normal limits  CBC   ____________________________________________  EKG   ____________________________________________  RADIOLOGY  US pelvis: Heterogeneous endometrium concerning for retained  POC's  ____________________________________________   PROCEDURES  Procedure(s) performed: No  Procedures  Critical Care performed: No ____________________________________________   INITIAL IMPRESSION / ASSESSMENT AND PLAN / ED COURSE  Pertinent labs & imaging results that were available during my care of the patient were reviewed by me and considered in my medical decision making (see chart for details).  22 year old female presents for evaluation for possible retained POC's after she had a positive pregnancy test several days ago, having had an elective abortion last month.  She has no symptoms.  On exam the patient is well-appearing.  Her vital signs are normal.  Her labs are normal except for hCG of 50.  Ultrasound was obtained from triage and does show findings consistent with retained POC's.  I consulted Dr. Jean RosenthalJackson from OB/GYN.  He advised that there is no indication for urgent D&C and he would watch the patient.  He recommends that she follows up with OB/GYN as an outpatient within the next week.  Since the patient's work hours limit her schedule, I gave her referral both to [facility] and [facility].  I discussed the results of the work-up with the patient.  She understands the plan of care and the return precautions.  She is stable for discharge at this time.  ____________________________________________   FINAL CLINICAL IMPRESSION(S) / ED DIAGNOSES  Final diagnoses:  Retained products of conception following abortion      NEW MEDICATIONS STARTED DURING THIS VISIT:  New Prescriptions   No medications on file     Note:  This document was prepared using Dragon voice recognition software and may include unintentional dictation errors.    Dionne BucySiadecki, [place] Torok, MD 02/26/18 2314

## 2018-03-02 ENCOUNTER — Encounter: Payer: Self-pay | Admitting: Obstetrics and Gynecology

## 2018-04-17 DIAGNOSIS — F32A Depression, unspecified: Secondary | ICD-10-CM | POA: Insufficient documentation

## 2018-04-17 DIAGNOSIS — F329 Major depressive disorder, single episode, unspecified: Secondary | ICD-10-CM | POA: Insufficient documentation

## 2018-04-17 LAB — HM HIV SCREENING LAB: HM HIV Screening: NEGATIVE

## 2018-11-18 ENCOUNTER — Ambulatory Visit (LOCAL_COMMUNITY_HEALTH_CENTER): Payer: Self-pay

## 2018-11-18 ENCOUNTER — Other Ambulatory Visit: Payer: Self-pay

## 2018-11-18 VITALS — BP 115/66 | Ht 66.5 in | Wt 158.5 lb

## 2018-11-18 DIAGNOSIS — Z3201 Encounter for pregnancy test, result positive: Secondary | ICD-10-CM

## 2018-11-18 LAB — PREGNANCY, URINE: Preg Test, Ur: POSITIVE — AB

## 2018-11-18 MED ORDER — PRENATAL VITAMIN 27-0.8 MG PO TABS: 1.0000 | ORAL_TABLET | Freq: Every day | ORAL | 0 refills | Status: DC

## 2018-11-18 NOTE — Progress Notes (Signed)
Unable to provide copy of [facility] to client as documented on flowsheet as unable to print record from [place]. Rich Number, RN

## 2018-12-30 ENCOUNTER — Telehealth: Payer: Self-pay

## 2018-12-30 ENCOUNTER — Other Ambulatory Visit: Payer: Self-pay | Admitting: Advanced Practice Midwife

## 2018-12-30 ENCOUNTER — Other Ambulatory Visit: Payer: Self-pay

## 2018-12-30 ENCOUNTER — Ambulatory Visit: Payer: Medicaid Other | Admitting: Advanced Practice Midwife

## 2018-12-30 VITALS — BP 122/74 | Temp 97.0°F | Wt 160.4 lb

## 2018-12-30 DIAGNOSIS — O09299 Supervision of pregnancy with other poor reproductive or obstetric history, unspecified trimester: Secondary | ICD-10-CM

## 2018-12-30 DIAGNOSIS — N760 Acute vaginitis: Secondary | ICD-10-CM

## 2018-12-30 DIAGNOSIS — Z8759 Personal history of other complications of pregnancy, childbirth and the puerperium: Secondary | ICD-10-CM | POA: Insufficient documentation

## 2018-12-30 DIAGNOSIS — F909 Attention-deficit hyperactivity disorder, unspecified type: Secondary | ICD-10-CM

## 2018-12-30 DIAGNOSIS — Z3481 Encounter for supervision of other normal pregnancy, first trimester: Secondary | ICD-10-CM

## 2018-12-30 DIAGNOSIS — Z822 Family history of deafness and hearing loss: Secondary | ICD-10-CM

## 2018-12-30 DIAGNOSIS — Z8659 Personal history of other mental and behavioral disorders: Secondary | ICD-10-CM | POA: Insufficient documentation

## 2018-12-30 DIAGNOSIS — B9689 Other specified bacterial agents as the cause of diseases classified elsewhere: Secondary | ICD-10-CM

## 2018-12-30 DIAGNOSIS — Z23 Encounter for immunization: Secondary | ICD-10-CM | POA: Insufficient documentation

## 2018-12-30 DIAGNOSIS — Z369 Encounter for antenatal screening, unspecified: Secondary | ICD-10-CM

## 2018-12-30 DIAGNOSIS — O0990 Supervision of high risk pregnancy, unspecified, unspecified trimester: Secondary | ICD-10-CM | POA: Insufficient documentation

## 2018-12-30 LAB — OB RESULTS CONSOLE GC/CHLAMYDIA
Chlamydia: NEGATIVE
Gonorrhea: NEGATIVE

## 2018-12-30 LAB — WET PREP FOR TRICH, YEAST, CLUE
Trichomonas Exam: NEGATIVE
Yeast Exam: NEGATIVE

## 2018-12-30 LAB — URINALYSIS
Bilirubin, UA: NEGATIVE
Glucose, UA: NEGATIVE
Ketones, UA: NEGATIVE
Nitrite, UA: NEGATIVE
Protein,UA: NEGATIVE
RBC, UA: NEGATIVE
Specific Gravity, UA: 1.025 (ref 1.005–1.030)
Urobilinogen, Ur: 1 mg/dL (ref 0.2–1.0)
pH, UA: 7 (ref 5.0–7.5)

## 2018-12-30 LAB — OB RESULTS CONSOLE HIV ANTIBODY (ROUTINE TESTING): HIV: NONREACTIVE

## 2018-12-30 LAB — HEMOGLOBIN, FINGERSTICK: Hemoglobin: 13.6 g/dL (ref 11.1–15.9)

## 2018-12-30 MED ORDER — METRONIDAZOLE 500 MG PO TABS: 500.0000 mg | ORAL_TABLET | Freq: Three times a day (TID) | ORAL | 0 refills | Status: DC

## 2018-12-30 NOTE — Telephone Encounter (Signed)
Phone call to patient to inform of scheduled DP appt. Informed patient to arrive at the  [facility] entrance around 8:30 for Covid Screening questions and after that will be directed to [facility] in the [place] for her scheduled 01/04/2019 9:00 GC and 10:00 [place] appt. Patient verbalized understanding of same. Hal Morales, RN

## 2018-12-30 NOTE — Progress Notes (Addendum)
Here today for IP at 12.4 weeks. Taking PNV QD. Denies ED/hospital visits since +PT. Accepts Flu vaccine. Hal Morales, RN Flu vaccine given and tolerated well. Treated for BV per standing orders. Hal Morales, RN

## 2018-12-30 NOTE — Progress Notes (Addendum)
[place] [address] [phone]  INITIAL PRENATAL VISIT NOTE  Subjective:  Colleen Chapman is a 23 y.o. SWF nonsmoker [id number] at [redacted]w[redacted]d being seen today to start prenatal care at the [facility] Department. Pt feels "happy" about planned pregnancy x 5 months.  23 yo employed FOB feels "happy" about pregnancy; they share 2 children and he has a 42 yo daughter by another woman and this child spends weekends with them. Pt employed 22 hrs/wk at [employer].  LMP "sometime in August".  She is currently monitored for the following issues for this low-risk pregnancy and has Depression; History of postpartum depression 2016; Hx of preeclampsia, prior pregnancy 2017; ADHD; Family hx-deafness--pt's mom; and Prenatal care, subsequent pregnancy, first trimester on their problem list.  Patient reports no complaints.  Contractions: Not present. Vag. Bleeding: None.  Movement: Absent. Denies leaking of fluid.   The following portions of the patient's history were reviewed and updated as appropriate: allergies, current medications, past family history, past medical history, past social history, past surgical history and problem list. Problem list updated.  Objective:   Vitals:   12/30/18 0846  BP: 122/74  Temp: (!) 97 F (36.1 C)  Weight: 160 lb 6.4 oz (72.8 kg)    Fetal Status: Fetal Heart Rate (bpm): 160 Fundal Height: 12 cm Movement: Absent     Physical Exam Vitals signs and nursing note reviewed.  Constitutional:      General: She is not in acute distress.    Appearance: Normal appearance. She is well-developed and normal weight.  HENT:     Head: Normocephalic and atraumatic.     Right Ear: External ear normal.     Left Ear: External ear normal.     Nose: Nose normal. No congestion or rhinorrhea.     Mouth/Throat:     Lips: Pink.     Mouth: Mucous membranes are moist.     Dentition:  Normal dentition. No dental caries.  Eyes:     General: No scleral icterus.    Conjunctiva/sclera: Conjunctivae normal.  Neck:     Musculoskeletal: Normal range of motion and neck supple.     Thyroid: No thyroid mass or thyromegaly.  Cardiovascular:     Rate and Rhythm: Normal rate and regular rhythm.     Pulses: Normal pulses.     Heart sounds: Normal heart sounds.     Comments: Extremities are warm and well perfused Pulmonary:     Effort: Pulmonary effort is normal.     Breath sounds: Normal breath sounds.  Chest:     Breasts: Breasts are symmetrical.        Right: Normal. No mass, nipple discharge or skin change.        Left: Normal. No mass, nipple discharge or skin change.     Comments: Doesn't want to breastfeed Abdominal:     General: Abdomen is flat.     Palpations: Abdomen is soft.     Tenderness: There is no abdominal tenderness.     Comments: Soft without tenderness, fundus 2 FBaS, FHR=160  Genitourinary:    General: Normal vulva.     Exam position: Lithotomy position.     Pubic Area: No rash.      Labia:        Right: No rash or lesion.        Left: No rash or lesion.      Vagina:  Vaginal discharge (white leukorrhea, ph<4.5) present.     Cervix: No cervical motion tenderness, friability or erythema.     Uterus: Normal. Enlarged (12 wks size). Not tender.      Adnexa: Right adnexa normal and left adnexa normal.     Rectum: Normal. No external hemorrhoid.  Musculoskeletal:     Right lower leg: No edema.     Left lower leg: No edema.  Lymphadenopathy:     Upper Body:     Right upper body: No axillary adenopathy.     Left upper body: No axillary adenopathy.  Skin:    General: Skin is warm and dry.     Capillary Refill: Capillary refill takes less than 2 seconds.  Neurological:     Mental Status: She is alert.     Assessment and Plan:  Pregnancy: [id number] at [redacted]w[redacted]d  1. History of postpartum depression 2016 With no medication.  PHq-9=4  2. Hx of  preeclampsia, prior pregnancy 2017 Agrees to ASA 81 mg daily today until 36 wks  3. Attention deficit hyperactivity disorder (ADHD), unspecified ADHD type No meds currently  4. Family hx-deafness--pt's mom Pt declines genetic counseling "my other two are fine"  5. Prenatal care, subsequent pregnancy, first trimester Pt desires FIRST screen--ordered.  Pap due 2021.  Last MJ 2019.  Last ETOH end of August 2020 (1 glass wine) - HIV [facility] - Prenatal profile without Varicella/Rubella ([id number]) - Urine Culture - Chlamydia/GC NAA, Confirmation - [id number] Drug Screen - WET PREP FOR TRICH, YEAST, CLUE - Urinalysis (Urine Dip) - Hemoglobin, venipuncture    Discussed overview of care and coordination with inpatient delivery practices including [facility], [facility], [facility] and [facility].   Reviewed Centering pregnancy as standard of care at [facility], oriented to room and showed video. Based on EDD, plan for Cycle  .    Preterm labor symptoms and general obstetric precautions including but not limited to vaginal bleeding, contractions, leaking of fluid and fetal movement were reviewed in detail with the patient.  Please refer to After Visit Summary for other counseling recommendations.   Return in about 4 weeks (around 01/27/2019).  No future appointments.  [name], CNM

## 2018-12-31 LAB — 789231 7+OXYCODONE-BUND
Amphetamines, Urine: NEGATIVE ng/mL
BENZODIAZ UR QL: NEGATIVE ng/mL
Barbiturate screen, urine: NEGATIVE ng/mL
Cannabinoid Quant, Ur: NEGATIVE ng/mL
Cocaine (Metab.): NEGATIVE ng/mL
OPIATE SCREEN URINE: NEGATIVE ng/mL
Oxycodone/Oxymorphone, Urine: NEGATIVE ng/mL
PCP Quant, Ur: NEGATIVE ng/mL

## 2019-01-01 LAB — CBC/D/PLT+RPR+RH+ABO+AB SCR
Basophils Absolute: 0 10*3/uL (ref 0.0–0.2)
Basos: 0 %
EOS (ABSOLUTE): 0 10*3/uL (ref 0.0–0.4)
Eos: 1 %
Hematocrit: 38.9 % (ref 34.0–46.6)
Hemoglobin: 13.4 g/dL (ref 11.1–15.9)
Hepatitis B Surface Ag: NEGATIVE
Immature Grans (Abs): 0 10*3/uL (ref 0.0–0.1)
Immature Granulocytes: 0 %
Lymphocytes Absolute: 1.4 10*3/uL (ref 0.7–3.1)
Lymphs: 20 %
MCH: 30.5 pg (ref 26.6–33.0)
MCHC: 34.4 g/dL (ref 31.5–35.7)
MCV: 88 fL (ref 79–97)
Monocytes Absolute: 0.4 10*3/uL (ref 0.1–0.9)
Monocytes: 6 %
Neutrophils Absolute: 4.9 10*3/uL (ref 1.4–7.0)
Neutrophils: 73 %
Platelets: 266 10*3/uL (ref 150–450)
RBC: 4.4 x10E6/uL (ref 3.77–5.28)
RDW: 12.4 % (ref 11.7–15.4)
RPR Ser Ql: NONREACTIVE
Rh Factor: POSITIVE
WBC: 6.7 10*3/uL (ref 3.4–10.8)

## 2019-01-01 LAB — URINE CULTURE

## 2019-01-01 LAB — AB SCR+ANTIBODY ID: Antibody Screen: POSITIVE — AB

## 2019-01-02 LAB — CHLAMYDIA/GC NAA, CONFIRMATION
Chlamydia trachomatis, NAA: NEGATIVE
Neisseria gonorrhoeae, NAA: NEGATIVE

## 2019-01-04 ENCOUNTER — Telehealth: Payer: Self-pay | Admitting: Licensed Clinical Social Worker

## 2019-01-04 ENCOUNTER — Other Ambulatory Visit: Payer: Self-pay

## 2019-01-04 ENCOUNTER — Ambulatory Visit
Admission: RE | Admit: 2019-01-04 | Discharge: 2019-01-04 | Disposition: A | Discharge status: Home / Self Care | Payer: Medicaid Other | Source: Ambulatory Visit | Attending: Obstetrics and Gynecology | Admitting: Obstetrics and Gynecology

## 2019-01-04 ENCOUNTER — Encounter: Payer: Self-pay | Admitting: Advanced Practice Midwife

## 2019-01-04 ENCOUNTER — Ambulatory Visit (HOSPITAL_BASED_OUTPATIENT_CLINIC_OR_DEPARTMENT_OTHER)
Admission: RE | Admit: 2019-01-04 | Discharge: 2019-01-04 | Disposition: A | Discharge status: Home / Self Care | Payer: Medicaid Other | Source: Ambulatory Visit | Attending: Obstetrics and Gynecology | Admitting: Obstetrics and Gynecology

## 2019-01-04 DIAGNOSIS — Z822 Family history of deafness and hearing loss: Secondary | ICD-10-CM

## 2019-01-04 DIAGNOSIS — Z369 Encounter for antenatal screening, unspecified: Secondary | ICD-10-CM | POA: Diagnosis not present

## 2019-01-04 DIAGNOSIS — R799 Abnormal finding of blood chemistry, unspecified: Secondary | ICD-10-CM | POA: Insufficient documentation

## 2019-01-04 DIAGNOSIS — Z3481 Encounter for supervision of other normal pregnancy, first trimester: Secondary | ICD-10-CM

## 2019-01-04 DIAGNOSIS — Z36 Encounter for antenatal screening for chromosomal anomalies: Secondary | ICD-10-CM

## 2019-01-04 NOTE — Telephone Encounter (Signed)
LCSW received message from Hal Morales, RN indicating patient was interested in depression resources. LCSW called patient to follow up- patient denied any current issues with depression, reporting that she is netter now that she is expecting a new baby. LCSW encouraged patient to reach out in the future, if needed.

## 2019-01-04 NOTE — Progress Notes (Signed)
Virtual Visit via Telephone Note  I connected with Colleen Chapman on January 04, 2019 at  9:00 AM EST by telephone and verified that I am speaking with the correct person using two identifiers.  Referring physician:  [facility] Department Length of consultation:  25 minutes  [name] was referred to [facility] for genetic counseling to review prenatal screening and testing options.  This note summarizes the information we discussed.    We offered the following routine screening tests for this pregnancy:  The most accurate screening option for chromosome conditions is cell free fetal DNA testing.  Though this is typically reserved for pregnancies at increased risk for aneuploidy, it is currently being made available and many insurance companies are adding coverage for this testing in low risk patients during COVID.  This test utilizes a maternal blood sample and DNA sequencing technology to isolate circulating cell free fetal DNA from maternal plasma.  The fetal DNA can then be analyzed for DNA sequences that are derived from the three most common chromosomes involved in aneuploidy, chromosomes 13, 18, and 21.  If the overall amount of DNA is greater than the expected level for any of these chromosomes, aneuploidy is suspected.  The detection rates are >99% for Down syndrome, >98% for trisomy 18 and >91% for Trisomy 13.  While we do not consider it a replacement for invasive testing and karyotype analysis, a negative result from this testing would be reassuring, though not a guarantee of a normal chromosome complement for the baby.  An abnormal result may be suggestive of an abnormal chromosome complement, though we would still recommend CVS or amniocentesis to confirm any findings from this testing.  First trimester screening, which includes nuchal translucency ultrasound screen and first trimester maternal serum marker screening, is the test that  has most recently been available for low risk patients.  The nuchal translucency has approximately an 80% detection rate for Down syndrome and can be positive for other chromosome abnormalities as well as congenital heart defects.  When combined with a maternal serum marker screening, the detection rate is up to 90% for Down syndrome and up to 97% for trisomy 18.   Given current recommendations during COVID, we are offering only the biochemical testing portion of this testing (without the ultrasound and NT portion), which has a much lower detection rate.  Maternal serum marker screening, or "quad" screen, is a blood test that measures pregnancy proteins, can provide risk assessments for Down syndrome, trisomy 18, and open neural tube defects (spina bifida, anencephaly). Because it does not directly examine the fetus, it cannot positively diagnose or rule out these problems. This is a second trimester option which could be offered along with the anatomy ultrasound. It can detect approximately 75% of babies with Down syndrome, 80% of babies with open spina bifida and 70% of babies with trisomy 31.  Targeted ultrasound uses high frequency sound waves to create an image of the developing fetus.  An ultrasound is often recommended as a routine means of evaluating the pregnancy.  It is also used to screen for fetal anatomy problems (for example, a heart defect) that might be suggestive of a chromosomal or other abnormality. We are currently not recommending a first trimester ultrasound other than that which would be ordered for dating and viability.  Should these screening tests indicate an increased concern, then the following additional testing options would be offered:  The chorionic villus sampling procedure is  available for first trimester chromosome analysis.  This involves the withdrawal of a small amount of chorionic villi (tissue from the developing placenta).  Risk of pregnancy loss is estimated to be  approximately 1 in 200 to 1 in 100 (0.5 to 1%).  There is approximately a 1% (1 in 100) chance that the CVS chromosome results will be unclear.  Chorionic villi cannot be tested for neural tube defects.     Amniocentesis involves the removal of a small amount of amniotic fluid from the sac surrounding the fetus with the use of a thin needle inserted through the maternal abdomen and uterus.  Ultrasound guidance is used throughout the procedure.  Fetal cells from amniotic fluid are directly evaluated and > 99.5% of chromosome problems and > 98% of open neural tube defects can be detected. This procedure is generally performed after the 15th week of pregnancy.  The main risks to this procedure include complications leading to miscarriage in less than 1 in 200 cases (0.5%).  Cystic Fibrosis and Spinal Muscular Atrophy (SMA) screening were also discussed with the patient. Both conditions are recessive, which means that both parents must be carriers in order to have a child with the disease.  Cystic fibrosis (CF) is one of the most common genetic conditions in persons of Caucasian ancestry.  This condition occurs in approximately 1 in 2,500 Caucasian persons and results in thickened secretions in the lungs, digestive, and reproductive systems.  For a baby to be at risk for having CF, both of the parents must be carriers for this condition.  Approximately 1 in 725 Caucasian persons is a carrier for CF.  Current carrier testing looks for the most common mutations in the gene for CF and can detect approximately 90% of carriers in the Caucasian population.  This means that the carrier screening can greatly reduce, but cannot eliminate, the chance for an individual to have a child with CF.  If an individual is found to be a carrier for CF, then carrier testing would be available for the partner. As part of [place]'s newborn screening profile, all babies born in the state of [place] will have a two-tier  screening process.  Specimens are first tested to determine the concentration of immunoreactive trypsinogen (IRT).  The top 5% of specimens with the highest IRT values then undergo DNA testing using a panel of over 40 common CF mutations. SMA is a neurodegenerative disorder that leads to atrophy of skeletal muscle and overall weakness.  This condition is also more prevalent in the Caucasian population, with 1 in 40-1 in 60 persons being a carrier and 1 in 6,000-1 in 10,000 children being affected.  There are multiple forms of the disease, with some causing death in infancy to other forms with survival into adulthood.  The genetics of SMA is complex, but carrier screening can detect up to 95% of carriers in the Caucasian population.  Similar to CF, a negative result can greatly reduce, but cannot eliminate, the chance to have a child with SMA. The patient declined carrier screening for CF and SMA.  We talked about the option of signing up for Early Check to have the baby tested for SMA after delivery as part of a new study in [place].  This registration can be done online prior to delivery if desired. We also reviewed the option of carrier screening for hemoglobinopathies. The patient is of Caucasian and Native American background and her partner is of [place] and Native [place] background.  We obtained a detailed family history and pregnancy history. The patient reported multiple family members for she and her husband with adult onset diabetes.  We reviewed that this condition is most often thought to be multifactorial, or due to a combination of both genetic and lifestyle factors.  She also reported ADHD in herself and her brothers.  We discussed that ADHD is often seen clustering in families, and likely has strong inherited components.  However, there is currently no genetic test available to determine who in a family will develop ADHD.  It is good to know about this history as their children enter school to seek  assistance as needed. Lastly, [name] stated that her mother has congenital deafness.  Her mother was adopted at birth and there is no other known family history for her.  [name] was also adopted and though she knows her biological mother, she has no contact with her and no knowledge of the underlying cause for her hearing loss.  We reviewed that there may be many different causes for hearing loss, including genetic conditions, prenatal exposures, environmental factors and others.  Without a knowledge of the cause, it is difficult to determine the chance for this pregnancy to have hearing impairment.  We did discuss that most inherited cases of hearing loss are recessive.  Hearing evaluation as part of newborn screening will be performed at birth.  The remainder of the family history was reported to be unremarkable for birth defects, intellectual delays, recurrent pregnancy loss or known chromosome abnormalities.  This is the fourth pregnancy for this patient, the third with her current partner.  She had one early termination.  The couple has a 85 year old son and a 45 year old daughter who are both reported to be in good health.  In the current pregnancy, she reported no complications or exposures to medications, alcohol, tobacco or recreational drugs that would be expected to increase the risk for birth defects.  After consideration of the options, [name] elected to have [place] with SCA drawn at [facility] on 01/04/2019 following her dating ultrasound. The patient declined carrier testing for hemooglobinopathies, CF, and SMA.  The patient was encouraged to call with questions or concerns.  We can be contacted at [phone].  Labs ordered: MaterniT21 PLUS with SCA  Wilburt Finlay, MS, CGC  I provided 25 minutes of non-face-to-face time during this encounter.   Donette Larry

## 2019-01-09 LAB — MATERNIT21 PLUS CORE+SCA
Fetal Fraction: 6
Monosomy X (Turner Syndrome): NOT DETECTED
Result (T21): NEGATIVE
Trisomy 13 (Patau syndrome): NEGATIVE
Trisomy 18 (Edwards syndrome): NEGATIVE
Trisomy 21 (Down syndrome): NEGATIVE
XXX (Triple X Syndrome): NOT DETECTED
XXY (Klinefelter Syndrome): NOT DETECTED
XYY (Jacobs Syndrome): NOT DETECTED

## 2019-01-11 ENCOUNTER — Telehealth: Payer: Self-pay | Admitting: Obstetrics and Gynecology

## 2019-01-11 NOTE — Telephone Encounter (Signed)
The patient was informed of the results of her recent MaterniT21 testing which yielded NEGATIVE results.  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  The sensitivity for trisomy 52, trisomy 2 and trisomy 11 using this testing are reported as 99.1%, 99.9% and 91.7% respectively.  Thus, while the results of this testing are highly accurate, they are not considered diagnostic at this time.  Should more definitive information be desired, the patient may still consider amniocentesis.   As requested to know by the patient, sex chromosome analysis was included for this sample.  Results are consistent with a female fetus (Y chromosome material was present in the sample). This is predicted with >99% accuracy. Results were also negative for sex chromosome abnormalities.  A maternal serum AFP only should be considered if screening for neural tube defects is desired.  Also of note, an additional referral was placed for an MFM consultation regarding Anti-M antibodies based upon patient lab results.  Dr. Diamantina Monks reviewed those results, which stated in the interpretation that the results were not abnormal and thus do not require a consultation at this time.  We may be reached at [phone] with any questions or concerns.   Wilburt Finlay, MS, CGC

## 2019-02-01 ENCOUNTER — Ambulatory Visit: Payer: Medicaid Other | Admitting: Advanced Practice Midwife

## 2019-02-01 ENCOUNTER — Other Ambulatory Visit: Payer: Self-pay

## 2019-02-01 VITALS — BP 114/68 | Temp 97.1°F | Wt 162.0 lb

## 2019-02-01 DIAGNOSIS — Z3481 Encounter for supervision of other normal pregnancy, first trimester: Secondary | ICD-10-CM

## 2019-02-01 DIAGNOSIS — Z822 Family history of deafness and hearing loss: Secondary | ICD-10-CM

## 2019-02-01 DIAGNOSIS — O09299 Supervision of pregnancy with other poor reproductive or obstetric history, unspecified trimester: Secondary | ICD-10-CM

## 2019-02-01 DIAGNOSIS — Z36 Encounter for antenatal screening for chromosomal anomalies: Secondary | ICD-10-CM

## 2019-02-01 NOTE — Progress Notes (Signed)
Here today for 17.1 week MH RV. Taking PNV QD, denies ED/hospital visits since last RV. Kept 01/04/2019 DP [place] and GC appt. Has follow up US scheduled for 02/08/2019. Declines AFP today. Hal Morales, RN

## 2019-02-01 NOTE — Progress Notes (Signed)
   PRENATAL VISIT NOTE  Subjective:  Colleen Chapman is a 23 y.o. [phone] at [redacted]w[redacted]d being seen today for ongoing prenatal care.  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  Patient reports no complaints.  Contractions: Not present. Vag. Bleeding: None.  Movement: Absent. Denies leaking of fluid/ROM.   The following portions of the patient's history were reviewed and updated as appropriate: allergies, current medications, past family history, past medical history, past social history, past surgical history and problem list. Problem list updated.  Objective:   Vitals:   02/01/19 0832  BP: 114/68  Temp: (!) 97.1 F (36.2 C)  Weight: 162 lb (73.5 kg)    Fetal Status: Fetal Heart Rate (bpm): 150 Fundal Height: 16 cm Movement: Absent     General:  Alert, oriented and cooperative. Patient is in no acute distress.  Skin: Skin is warm and dry. No rash noted.   Cardiovascular: Normal heart rate noted  Respiratory: Normal respiratory effort, no problems with respiration noted  Abdomen: Soft, gravid, appropriate for gestational age.  Pain/Pressure: Absent     Pelvic: Cervical exam deferred        Extremities: Normal range of motion.  Edema: None  Mental Status: Normal mood and affect. Normal behavior. Normal judgment and thought content.   Assessment and Plan:  Pregnancy: [id number] at [redacted]w[redacted]d  1. Encounter for antenatal screening for chromosomal anomalies FIRST screen 01/04/19 neg with genetic counseling  2. Family history of deafness In biological mom; genetic counseling 01/04/19  3. Hx of  preeclampsia, prior pregnancy 2017 Has not bought ASA 81 mg yet--plans to do so today   4. Prenatal care, subsequent pregnancy, first trimester Denies sxs depression or need to see [place] [facility] for counseling.  Working 40 hrs/wk 12 hr shifts.  Declines AFP only today.  Reminded of 02/08/19 anatomy u/s.  Reviewed 01/04/19 u/s at 13 1/7 wks. Here with FOB and 2 children 12 lb (5.443 kg)     Preterm labor symptoms and general obstetric precautions including but not limited to vaginal bleeding, contractions, leaking of fluid and fetal movement were reviewed in detail with the patient. Please refer to After Visit Summary for other counseling recommendations.  No follow-ups on file.  Future Appointments  Date Time Provider [place]  02/01/2019 10:00 AM Herbie Saxon, CNM [facility] None  02/08/2019 10:00 AM [facility] [place] [facility]    Herbie Saxon, CNM

## 2019-02-04 ENCOUNTER — Other Ambulatory Visit: Payer: Self-pay

## 2019-02-04 DIAGNOSIS — Z3A16 16 weeks gestation of pregnancy: Secondary | ICD-10-CM

## 2019-02-08 ENCOUNTER — Ambulatory Visit
Admission: RE | Admit: 2019-02-08 | Discharge: 2019-02-08 | Disposition: A | Discharge status: Home / Self Care | Payer: Medicaid Other | Source: Ambulatory Visit | Attending: Maternal & Fetal Medicine | Admitting: Maternal & Fetal Medicine

## 2019-02-08 ENCOUNTER — Other Ambulatory Visit: Payer: Self-pay

## 2019-02-08 DIAGNOSIS — Z3A18 18 weeks gestation of pregnancy: Secondary | ICD-10-CM | POA: Diagnosis not present

## 2019-02-08 DIAGNOSIS — O321XX0 Maternal care for breech presentation, not applicable or unspecified: Secondary | ICD-10-CM | POA: Insufficient documentation

## 2019-02-08 DIAGNOSIS — Z3689 Encounter for other specified antenatal screening: Secondary | ICD-10-CM | POA: Insufficient documentation

## 2019-02-08 DIAGNOSIS — Z3A16 16 weeks gestation of pregnancy: Secondary | ICD-10-CM

## 2019-02-09 MED ORDER — METRONIDAZOLE 500 MG PO TABS: 250.0000 mg | ORAL_TABLET | Freq: Three times a day (TID) | ORAL | 0 refills | Status: AC

## 2019-02-09 NOTE — Addendum Note (Signed)
Addended by: Hal Morales A on: 02/09/2019 05:15 PM   Modules accepted: Orders

## 2019-02-12 ENCOUNTER — Encounter: Payer: Self-pay | Admitting: Family Medicine

## 2019-02-12 DIAGNOSIS — O0990 Supervision of high risk pregnancy, unspecified, unspecified trimester: Secondary | ICD-10-CM

## 2019-03-02 ENCOUNTER — Other Ambulatory Visit: Payer: Self-pay

## 2019-03-02 ENCOUNTER — Ambulatory Visit: Payer: Medicaid Other | Admitting: Family Medicine

## 2019-03-02 ENCOUNTER — Encounter: Payer: Self-pay | Admitting: Family Medicine

## 2019-03-02 VITALS — BP 114/71 | HR 88 | Temp 97.7°F | Wt 170.2 lb

## 2019-03-02 DIAGNOSIS — Z8659 Personal history of other mental and behavioral disorders: Secondary | ICD-10-CM

## 2019-03-02 DIAGNOSIS — O09299 Supervision of pregnancy with other poor reproductive or obstetric history, unspecified trimester: Secondary | ICD-10-CM

## 2019-03-02 DIAGNOSIS — O0990 Supervision of high risk pregnancy, unspecified, unspecified trimester: Secondary | ICD-10-CM

## 2019-03-02 NOTE — Progress Notes (Signed)
Here today for 21.2 week MH RV. Taking PNV QD, is not taking ASA. Denies ED/hsopital visits since last RV. Blood Transfusion Consent Form and CCNC forms today. Hal Morales, RN

## 2019-03-02 NOTE — Progress Notes (Signed)
   PRENATAL VISIT NOTE  Subjective:  Colleen Chapman is a 23 y.o. [phone] at [redacted]w[redacted]d being seen today for ongoing prenatal care.  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  Patient reports no complaints.  Contractions: Not present. Vag. Bleeding: None.  Movement: Absent. Denies leaking of fluid/ROM.   She works in a [employer], [employer]. Wondering what restrictions she needs in pregnancy, requests work note.   The following portions of the patient's history were reviewed and updated as appropriate: allergies, current medications, past family history, past medical history, past social history, past surgical history and problem list. Problem list updated.  Objective:   Vitals:   03/02/19 1004  BP: 114/71  Pulse: 88  Temp: 97.7 F (36.5 C)  Weight: 170 lb 3.2 oz (77.2 kg)    Fetal Status: Fetal Heart Rate (bpm): 150 Fundal Height: 21 cm Movement: Absent     General:  Alert, oriented and cooperative. Patient is in no acute distress.  Skin: Skin is warm and dry. No rash noted.   Cardiovascular: Normal heart rate noted  Respiratory: Normal respiratory effort, no problems with respiration noted  Abdomen: Soft, gravid, appropriate for gestational age.  Pain/Pressure: Absent     Pelvic: Cervical exam deferred        Extremities: Normal range of motion.  Edema: None  Mental Status: Normal mood and affect. Normal behavior. Normal judgment and thought content.   Assessment and Plan:  Pregnancy: [id number] at [redacted]w[redacted]d    1. Supervision of high risk pregnancy, antepartum -Informed consent  signed.  -Work note given with timeline of pregnancy restrictions per pt request.  -Pt prefers to have telehealth next visit, will rx BP cuff today, if doesn't arrive in time pt to call and change to in person visit.   2. History of depression/postpartum depression 2016 -Pt states she spoke with beh health counselor though didn't feel it was necessary right now, declines add'l counseling. Phq-9 score 3 today, pt states she is busy and tired but is managing well. Will continue to follow at upcoming visits.  3. Hx of preeclampsia, prior pregnancy 2017 -Pt has not yet started aspirin, wasn't sure it was necessary. Discussed risks/benefits, pt states she will purchase this and begin taking.   Preterm labor symptoms and general obstetric precautions including but not limited to vaginal bleeding, contractions, leaking of fluid and fetal movement were reviewed in detail with the patient. Please refer to After Visit Summary for other counseling recommendations.  Return in about 4 weeks (around 03/30/2019) for telehealth/virtual health OB visit.  Future Appointments  Date Time Provider [place]  03/24/2019  1:20 PM [facility] PROVIDER [facility] None    Kandee Keen, PA-C

## 2019-03-05 NOTE — L&D Delivery Note (Signed)
Operative Delivery Note At 5:19 PM a viable and healthy female "Messiah" was delivered via Vaginal, Spontaneous.  Presentation: vertex; Position: Occiput,, Posterior; Station: +2.  Verbal consent: obtained from patient.  Risks and benefits discussed in detail.  Risks include, but are not limited to the risks of anesthesia, bleeding, infection, damage to maternal tissues, fetal cephalhematoma.  There is also the risk of inability to effect vaginal delivery of the head, or shoulder dystocia that cannot be resolved by established maneuvers, leading to the need for emergency cesarean section.  APGAR: 8,9 ; weight pending.   Placenta status: spontaneous, intact.   Cord: 3VC with the following complications: loose nuchal, delivered through .  Cord pH: pending  Anesthesia:  epidural Instruments: Kiwi Episiotomy: None Lacerations:  None Est. Blood Loss (mL):  21ml Mom to postpartum.  Baby to Couplet care / Skin to Skin.  23yo [id number] at [redacted]w[redacted]d: I was called to LDR for concerning Cat II strip with deep variable decels, and progressively deep Late decels. Nadir in the 60s. Discussion of VAVD with r/b/a. 3 pulls, no pop-offs, baby delivered and immediately vigorous on maternal abdomen.  Christeen Douglas 07/13/2019, 5:37 PM

## 2019-03-24 ENCOUNTER — Ambulatory Visit: Payer: Medicaid Other

## 2019-03-24 ENCOUNTER — Other Ambulatory Visit: Payer: Self-pay

## 2019-03-24 ENCOUNTER — Ambulatory Visit: Payer: Medicaid Other | Admitting: Advanced Practice Midwife

## 2019-03-24 DIAGNOSIS — O0990 Supervision of high risk pregnancy, unspecified, unspecified trimester: Secondary | ICD-10-CM

## 2019-03-24 DIAGNOSIS — O09299 Supervision of pregnancy with other poor reproductive or obstetric history, unspecified trimester: Secondary | ICD-10-CM

## 2019-03-24 NOTE — Progress Notes (Signed)
Pt denies visits to ER since last appt with [facility]. Taking PNV. Discussed PTL and provided handout. Pt has multiple complaints to discuss with provider about menstrual type cramps after working long hours and headaches.

## 2019-03-24 NOTE — Progress Notes (Signed)
   PRENATAL VISIT NOTE  Subjective:  Colleen Chapman is a 24 y.o. [phone] at [redacted]w[redacted]d being seen today for ongoing prenatal care.  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  Patient reports headache.  Contractions: Not present. Vag. Bleeding: None.  Movement: Present. Denies leaking of fluid/ROM.   The following portions of the patient's history were reviewed and updated as appropriate: allergies, current medications, past family history, past medical history, past social history, past surgical history and problem list. Problem list updated.  Objective:   Vitals:   03/24/19 1310  BP: 116/65  Pulse: 91  Temp: 97.8 F (36.6 C)  Weight: 183 lb (83 kg)    Fetal Status: Fetal Heart Rate (bpm): 150 Fundal Height: 25 cm Movement: Present     General:  Alert, oriented and cooperative. Patient is in no acute distress.  Skin: Skin is warm and dry. No rash noted.   Cardiovascular: Normal heart rate noted  Respiratory: Normal respiratory effort, no problems with respiration noted  Abdomen: Soft, gravid, appropriate for gestational age.  Pain/Pressure: Absent     Pelvic: Cervical exam deferred        Extremities: Normal range of motion.  Edema: None  Mental Status: Normal mood and affect. Normal behavior. Normal judgment and thought content.   Assessment and Plan:  Pregnancy: [id number] at [redacted]w[redacted]d  1. Supervision of high risk pregnancy, antepartum C/o h/a--suggestions given Working 12 hour shifts 40 hrs/wk--declines need for note to work no more than 8 hour shifts Denies depressive sxs and hasn't seen  Kathreen Cosier this pregnancy Never bought ASA 81 mg and is not taking   Preterm labor symptoms and general obstetric precautions including but not limited to vaginal bleeding, contractions, leaking of fluid and fetal movement were reviewed in detail with the patient. Please refer to After Visit Summary for other counseling recommendations.  No follow-ups on file.  No future appointments.  [name], CNM

## 2019-04-09 ENCOUNTER — Telehealth: Payer: Self-pay | Admitting: General Practice

## 2019-04-09 NOTE — Telephone Encounter (Signed)
Patient called clinic stating she "needed to be put on short term disability." Patient states that she was given information from "the nurse that tells me what I can and can't do at work now I'm losing my job cause I can't sit in a chair anymore." Patient states she was given a chair to sit in at work but has since been told she can no longer sit in a chair while at work and needs to go on short term disability. Patient states she needs an "appt to come in and talk to a provider." Patient appt moved up to 04/14/19 @ 2:00. Instructed patient to arrive at 1:45 for check in. Tawny Hopping, RN

## 2019-04-09 NOTE — Telephone Encounter (Signed)
pt is claiming job stated she needs to be put in on short term disability

## 2019-04-14 ENCOUNTER — Ambulatory Visit: Payer: Medicaid Other | Admitting: Advanced Practice Midwife

## 2019-04-14 ENCOUNTER — Other Ambulatory Visit: Payer: Self-pay

## 2019-04-14 VITALS — BP 129/76 | HR 83 | Temp 97.8°F | Wt 187.6 lb

## 2019-04-14 DIAGNOSIS — Z23 Encounter for immunization: Secondary | ICD-10-CM | POA: Diagnosis not present

## 2019-04-14 DIAGNOSIS — O0990 Supervision of high risk pregnancy, unspecified, unspecified trimester: Secondary | ICD-10-CM

## 2019-04-14 DIAGNOSIS — O09299 Supervision of pregnancy with other poor reproductive or obstetric history, unspecified trimester: Secondary | ICD-10-CM

## 2019-04-14 LAB — HIV ANTIBODY (ROUTINE TESTING W REFLEX): HIV Screen 4th Generation wRfx: NONREACTIVE

## 2019-04-14 LAB — HEMOGLOBIN, FINGERSTICK: Hemoglobin: 13.3 g/dL (ref 11.1–15.9)

## 2019-04-14 NOTE — Progress Notes (Signed)
Patient here for MH RV at 27 3/7. 28 week labs today, Tdap and 1 hour gtt. Patient needs to be to lab by 2:50pm. Tdap given, right deltoid, tolerated well, VIS given. Patient states her supervisor at work requesting she get a note from provider for short term disability. Previously was allowed use of a chair at work during her 12 hour shifts, but this accomodation has been revoked. Would like to talk to provider about this.Burt Knack, RN

## 2019-04-14 NOTE — Progress Notes (Signed)
   PRENATAL VISIT NOTE  Subjective:  Colleen Chapman is a 24 y.o. [phone] at [redacted]w[redacted]d being seen today for ongoing prenatal care.  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  Patient reports pt states her work took her stool away from her and now wants [place] to put her out on short term disability.  Contractions: Not present. Vag. Bleeding: None.  Movement: Present. Denies leaking of fluid/ROM.   The following portions of the patient's history were reviewed and updated as appropriate: allergies, current medications, past family history, past medical history, past social history, past surgical history and problem list. Problem list updated.  Objective:   Vitals:   04/14/19 1404  BP: 129/76  Pulse: 83  Temp: 97.8 F (36.6 C)  Weight: 187 lb 9.6 oz (85.1 kg)    Fetal Status: Fetal Heart Rate (bpm): 140 Fundal Height: 28 cm Movement: Present     General:  Alert, oriented and cooperative. Patient is in no acute distress.  Skin: Skin is warm and dry. No rash noted.   Cardiovascular: Normal heart rate noted  Respiratory: Normal respiratory effort, no problems with respiration noted  Abdomen: Soft, gravid, appropriate for gestational age.  Pain/Pressure: Absent     Pelvic: Cervical exam deferred        Extremities: Normal range of motion.  Edema: None  Mental Status: Normal mood and affect. Normal behavior. Normal judgment and thought content.   Assessment and Plan:  Pregnancy: [id number] at [redacted]w[redacted]d  1. Supervision of high risk pregnancy, antepartum Pt states her work gave her a stool x 1 week and then  took it away and denied her accomadations.  Now work told her to tell us to take her out of work and put her on short term disabilty.  Pt now agrees to work note stating pregnancy guidelines for [employer].  Pt is not eligible for medical disabilty--short term or long term at this time, and may work during pregnancy - HIV [place] [facility] - RPR - Glucose tolerance, 1 hour - Hemoglobin, venipuncture  2. Hx of preeclampsia, prior pregnancy 2017 BP wnl today   Preterm labor symptoms and general obstetric precautions including but not limited to vaginal bleeding, contractions, leaking of fluid and fetal movement were reviewed in detail with the patient. Please refer to After Visit Summary for other counseling recommendations.  No follow-ups on file.  No future appointments.  [name], CNM

## 2019-04-15 ENCOUNTER — Telehealth: Payer: Self-pay | Admitting: Family Medicine

## 2019-04-15 LAB — RPR: RPR Ser Ql: NONREACTIVE

## 2019-04-15 LAB — GLUCOSE, 1 HOUR GESTATIONAL: Gestational Diabetes Screen: 87 mg/dL (ref 65–139)

## 2019-04-15 NOTE — Addendum Note (Signed)
Addended by: Burt Knack on: 04/15/2019 04:56 PM   Modules accepted: Orders

## 2019-04-20 ENCOUNTER — Telehealth: Payer: Self-pay | Admitting: Family Medicine

## 2019-04-20 NOTE — Telephone Encounter (Signed)
Alex from [employer] at [phone]- claim # [id number]- please call and update pts pregnancy.

## 2019-04-22 ENCOUNTER — Ambulatory Visit: Payer: Medicaid Other

## 2019-04-23 ENCOUNTER — Telehealth: Payer: Self-pay | Admitting: Family Medicine

## 2019-04-23 NOTE — Telephone Encounter (Signed)
patient wants a call back from nurse. she needs her leave paper work filled out.

## 2019-04-27 ENCOUNTER — Ambulatory Visit: Payer: Medicaid Other | Admitting: Family Medicine

## 2019-04-27 ENCOUNTER — Other Ambulatory Visit: Payer: Self-pay

## 2019-04-27 ENCOUNTER — Encounter: Payer: Self-pay | Admitting: Family Medicine

## 2019-04-27 VITALS — BP 125/75 | HR 89 | Temp 98.6°F | Wt 193.4 lb

## 2019-04-27 DIAGNOSIS — F32A Depression, unspecified: Secondary | ICD-10-CM

## 2019-04-27 DIAGNOSIS — O09299 Supervision of pregnancy with other poor reproductive or obstetric history, unspecified trimester: Secondary | ICD-10-CM

## 2019-04-27 DIAGNOSIS — F329 Major depressive disorder, single episode, unspecified: Secondary | ICD-10-CM

## 2019-04-27 DIAGNOSIS — O0990 Supervision of high risk pregnancy, unspecified, unspecified trimester: Secondary | ICD-10-CM

## 2019-04-27 LAB — URINALYSIS
Bilirubin, UA: NEGATIVE
Glucose, UA: NEGATIVE
Ketones, UA: NEGATIVE
Nitrite, UA: NEGATIVE
RBC, UA: NEGATIVE
Specific Gravity, UA: 1.025 (ref 1.005–1.030)
Urobilinogen, Ur: 1 mg/dL (ref 0.2–1.0)
pH, UA: 7 (ref 5.0–7.5)

## 2019-04-27 NOTE — Progress Notes (Signed)
  PRENATAL VISIT NOTE  Subjective:  Colleen Chapman is a 24 y.o. [phone] at [redacted]w[redacted]d being seen today for ongoing prenatal care.  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   Contractions: Not present. Vag. Bleeding: None.  Movement: Present. Denies leaking of fluid/ROM.   Has had swelling of hands/feet x2 months, worse at end of day. Endorses HA every couple days, she takes tylenol and naps which helps somewhat. She associates HA onset with with times of stress of parenting, sleep deprivation and dehydration. Denies vision disturbance, abd pain or face swelling. She has a hx of pre-e, not currently taking ASA.  The following portions of the patient's history were reviewed and updated as appropriate: allergies, current medications, past family history, past medical history, past social history, past surgical history and problem list. Problem list updated.  Objective:   Vitals:   04/27/19 1103  BP: 125/75  Pulse: 89  Temp: 98.6 F (37 C)  Weight: 193 lb 6.4 oz (87.7 kg)    Fetal Status: Fetal Heart Rate (bpm): 130 Fundal Height: 30 cm Movement: Present     General:  Alert, oriented and cooperative. Patient is in no acute distress.  Skin: Skin is warm and dry. No rash noted.   Cardiovascular: Normal heart rate noted  Respiratory: Normal respiratory effort, no problems with respiration noted  Abdomen: Soft, gravid, appropriate for gestational age.  Pain/Pressure: Absent     Pelvic: Cervical exam deferred        Extremities: Normal range of motion.  Edema: Trace  Mental Status: Normal mood and  affect. Normal behavior. Normal judgment and thought content.   Assessment and Plan:  Pregnancy: [id number] at [redacted]w[redacted]d   1. Supervision of high risk pregnancy, antepartum -Up to date. -FMLA/Medical Disability forms received via fax today from pt's employment. I discussed with pt that as she is so far experiencing a healthy, normal pregnancy she is eligible for 6 weeks of medical leave starting at time of delivery. She states understanding. Forms completed today.   2. Hx of preeclampsia, prior pregnancy 2017 -Labs today as below d/t reports of hand/feet swelling x2 months, frequent HAs. BP today WNL. Urine with trace protein. HA sounds like tension type, is associated w/stress, sleep deprivation, dehydration. Advised rest, hydration, tylenol PRN. Offered beh health referral, pt declines.  -S/sx of preeclampsia reviewed, advised pt if HA is ever severe/persistant or other sx present to go to ER if present.  -She has not been taking aspirin.  - Urinalysis (Urine Dip) - PIH Panel ([facility] [id number]) - Protein / creatinine ratio, urine  3. Depression, unspecified depression type -States she is emotionally doing well. Declines beh health referral but has been getting check-ins from prenatal care team.   Preterm labor symptoms and general obstetric precautions including but not limited to vaginal bleeding, contractions, leaking of fluid and fetal movement were reviewed in detail with the patient. Please refer to After Visit Summary for other counseling recommendations.  Return in about 2 weeks (around 05/11/2019).  Future Appointments  Date Time Provider Department Center  05/11/2019 11:00 AM [facility] PROVIDER [facility] None    Ann Held, PA-C

## 2019-04-27 NOTE — Progress Notes (Signed)
FMLA/Disability paperwork and provider letter faxed, with confirmation, to [employer] at [phone]. Copy sent for scanning and copy saved for patient to pick up at next RV. ROI sent for scanning.Burt Knack, RN

## 2019-04-27 NOTE — Progress Notes (Signed)
Patient here for MH RV at 29.2 weeks. Signed ROI for [employer] so RN can call. Patient thinks she needs some paperwork filled out, and is calling her employer to give them our fax number so paperwork can be faxed to [facility] for provider to complete.Burt Knack, RN

## 2019-04-27 NOTE — Progress Notes (Signed)
Urine dip reviewed by provider, spot prot/creat added to orders.Burt Knack, RN

## 2019-04-28 ENCOUNTER — Ambulatory Visit: Payer: Medicaid Other

## 2019-04-28 LAB — AST+BUN+CREAT+LD+URIC A+HGB...
AST: 13 IU/L (ref 0–40)
BUN: 7 mg/dL (ref 6–20)
Creatinine, Ser: 0.53 mg/dL — ABNORMAL LOW (ref 0.57–1.00)
GFR calc Af Amer: 154 mL/min/{1.73_m2} (ref >59)
GFR calc non Af Amer: 133 mL/min/{1.73_m2} (ref >59)
Hematocrit: 38.7 % (ref 34.0–46.6)
Hemoglobin: 13.5 g/dL (ref 11.1–15.9)
LDH: 146 IU/L (ref 119–226)
Platelets: 232 10*3/uL (ref 150–450)
Uric Acid: 3.4 mg/dL (ref 2.6–6.2)

## 2019-04-28 LAB — PROTEIN / CREATININE RATIO, URINE
Creatinine, Urine: 172.8 mg/dL
Protein, Ur: 22.3 mg/dL
Protein/Creat Ratio: 129 mg/g creat (ref 0–200)

## 2019-05-09 ENCOUNTER — Observation Stay
Admission: EM | Admit: 2019-05-09 | Discharge: 2019-05-09 | Disposition: A | Discharge status: Home / Self Care | Payer: Medicaid Other | Attending: Obstetrics and Gynecology | Admitting: Obstetrics and Gynecology

## 2019-05-09 ENCOUNTER — Encounter: Payer: Self-pay | Admitting: Obstetrics and Gynecology

## 2019-05-09 ENCOUNTER — Other Ambulatory Visit: Payer: Self-pay

## 2019-05-09 DIAGNOSIS — O26893 Other specified pregnancy related conditions, third trimester: Principal | ICD-10-CM | POA: Insufficient documentation

## 2019-05-09 DIAGNOSIS — Z3A31 31 weeks gestation of pregnancy: Secondary | ICD-10-CM | POA: Diagnosis not present

## 2019-05-09 DIAGNOSIS — Z881 Allergy status to other antibiotic agents status: Secondary | ICD-10-CM | POA: Insufficient documentation

## 2019-05-09 DIAGNOSIS — Z8759 Personal history of other complications of pregnancy, childbirth and the puerperium: Secondary | ICD-10-CM | POA: Insufficient documentation

## 2019-05-09 DIAGNOSIS — R102 Pelvic and perineal pain: Secondary | ICD-10-CM | POA: Diagnosis not present

## 2019-05-09 DIAGNOSIS — O0990 Supervision of high risk pregnancy, unspecified, unspecified trimester: Secondary | ICD-10-CM

## 2019-05-09 LAB — URINALYSIS, COMPLETE (UACMP) WITH MICROSCOPIC
Bacteria, UA: NONE SEEN
Bilirubin Urine: NEGATIVE
Glucose, UA: NEGATIVE mg/dL
Hgb urine dipstick: NEGATIVE
Ketones, ur: NEGATIVE mg/dL
Nitrite: NEGATIVE
Protein, ur: 30 mg/dL — AB
Specific Gravity, Urine: 1.026 (ref 1.005–1.030)
pH: 7 (ref 5.0–8.0)

## 2019-05-09 LAB — WET PREP, GENITAL
Clue Cells Wet Prep HPF POC: NONE SEEN
Sperm: NONE SEEN
Trich, Wet Prep: NONE SEEN
Yeast Wet Prep HPF POC: NONE SEEN

## 2019-05-09 LAB — CHLAMYDIA/NGC RT PCR (ARMC ONLY)
Chlamydia Tr: NOT DETECTED
N gonorrhoeae: NOT DETECTED

## 2019-05-09 MED ORDER — ACETAMINOPHEN 325 MG PO TABS
650.0000 mg | ORAL_TABLET | ORAL | Status: DC | PRN
  Administered 2019-05-09: 22:00:00 650 mg via ORAL
  Filled 2019-05-09: qty 2

## 2019-05-09 MED ORDER — ACETAMINOPHEN 325 MG PO TABS: 650.0000 mg | ORAL_TABLET | ORAL | Status: DC | PRN

## 2019-05-09 NOTE — Discharge Summary (Signed)
Colleen Chapman is a 24 y.o. female. She is at [redacted]w[redacted]d gestation. Patient's last menstrual period was 10/03/2018 (within days). Estimated Date of Delivery: 07/11/19  Prenatal care site:  [facility]   Current pregnancy complicated by:  1. Hx PP depression 2. Hx pre-eclampsia with 2017 3. Hx Anti-M antibody screen    Chief complaint: left groin pain  Location: left groin Onset/timing: 2 weeks Duration: intermittent discomfort Quality: n/a Severity: 7/10 Aggravating or alleviating conditions: worse when she lifts her other two children, or when baby moves. Pregnancy pillow helps at night.  Associated signs/symptoms: denies dysuria, LOF, VB or contractions.  Context: has scheduled appt on 3/9 at [facility]  S: Resting comfortably. no CTX, no VB.no LOF,  Active fetal movement. Denies: HA, visual changes, SOB, or RUQ/epigastric pain  Maternal Medical History:   Past Medical History:  Diagnosis Date  . ADHD (attention deficit hyperactivity disorder)    ongoing  . Depression    "Because I was forced to have an abortion"  . History of urinary tract infection   . On Depo-Provera for contraception   . Pregnancy induced hypertension    Preeclampsia with 2017 delivery    Past Surgical History:  Procedure Laterality Date  . EAB  01/2018  . NO PAST SURGERIES      Allergies  Allergen Reactions  . Bactrim [Sulfamethoxazole-Trimethoprim] Rash    Prior to Admission medications   Medication Sig Start Date End Date Taking? Authorizing Provider  acetaminophen (TYLENOL) 325 MG tablet Take 2 tablets (650 mg total) by mouth every 4 (four) hours as needed (for pain scale < 4  OR  temperature  >/=  100.5 F). 05/09/19   Tandi Hanko, Murray Hodgkins, CNM  Prenatal Vit-Fe Fumarate-FA (PRENATAL VITAMIN) 27-0.8 MG TABS Take 1 tablet by mouth daily at 6 (six) AM. 11/18/18   Caren Macadam, MD      Social History: She  reports that she has never smoked. She has never used smokeless tobacco. She reports current  alcohol use. She reports that she does not use drugs.  Family History: family history includes Anemia in her mother; Asthma in her father; Bipolar disorder in her mother; Birth defects in her mother; Cancer in her paternal grandmother; Deafness in her mother; Diabetes in her father; Hypertension in her father.   Review of Systems: A full review of systems was performed and negative except as noted in the HPI.     O:  BP 133/68   Pulse 93   Temp 97.8 F (36.6 C) (Oral)   Resp 17   LMP 10/03/2018 (Within Days)  Results for orders placed or performed during the hospital encounter of 05/09/19 (from the past 48 hour(s))  Urinalysis, Complete w Microscopic   Collection Time: 05/09/19  9:36 PM  Result Value Ref Range   Color, Urine YELLOW (A) YELLOW   APPearance HAZY (A) CLEAR   Specific Gravity, Urine 1.026 1.005 - 1.030   pH 7.0 5.0 - 8.0   Glucose, UA NEGATIVE NEGATIVE mg/dL   Hgb urine dipstick NEGATIVE NEGATIVE   Bilirubin Urine NEGATIVE NEGATIVE   Ketones, ur NEGATIVE NEGATIVE mg/dL   Protein, ur 30 (A) NEGATIVE mg/dL   Nitrite NEGATIVE NEGATIVE   Leukocytes,Ua TRACE (A) NEGATIVE   RBC / HPF 0-5 0 - 5 RBC/hpf   WBC, UA 6-10 0 - 5 WBC/hpf   Bacteria, UA NONE SEEN NONE SEEN   Squamous Epithelial / LPF 21-50 0 - 5   Mucus PRESENT   Wet prep,  genital   Collection Time: 05/09/19  9:54 PM  Result Value Ref Range   Yeast Wet Prep HPF POC NONE SEEN NONE SEEN   Trich, Wet Prep NONE SEEN NONE SEEN   Clue Cells Wet Prep HPF POC NONE SEEN NONE SEEN   WBC, Wet Prep HPF POC MANY (A) NONE SEEN   Sperm NONE SEEN      Constitutional: NAD, AAOx3  HE/ENT: extraocular movements grossly intact, moist mucous membranes CV: RRR PULM: nl respiratory effort, CTABL     Abd: gravid, non-tender, non-distended, soft      Ext: Non-tender, Nonedematous   Psych: mood appropriate, speech normal Pelvic: Cervix long and closed, fetal presenting parts out of pelvis.   Fetal  monitoring: Cat I  Appropriate for GA, reactive NST Baseline: 125bpm Variability: moderate Accelerations: present x >2 Decelerations absent  Toco: No UCs palpated.     A/P: 24 y.o. [redacted]w[redacted]d here for antenatal surveillance for left groin pain   Principle Diagnosis:  Ligament pain in preg, 31wks   Preterm labor: not present.   Fetal Wellbeing: Reassuring Cat 1 tracing with reactive NST   UA contaminated, pt with no acute urinary complaints. Wet prep WNL. GC/CT pending.   D/c home stable, precautions reviewed, follow-up as scheduled.    Randa Ngo, CNM 05/09/2019  10:52 PM

## 2019-05-09 NOTE — OB Triage Note (Signed)
Pt arrives with c/o left lower groin pain that has been mostly constant over the last 1-2weeks. Pt reports +FM. Denies LOF/CTX/bleeding. Reports cramping a couple times day. Pt states she only drinks 1 bottle of water a day. Reports taking tylenol prn for groin pain and intermittent headaches. VSS. Monitors applied.

## 2019-05-11 ENCOUNTER — Ambulatory Visit: Payer: Self-pay

## 2019-05-12 ENCOUNTER — Ambulatory Visit: Payer: Medicaid Other | Admitting: Family Medicine

## 2019-05-12 ENCOUNTER — Other Ambulatory Visit: Payer: Self-pay

## 2019-05-12 ENCOUNTER — Encounter: Payer: Self-pay | Admitting: Family Medicine

## 2019-05-12 VITALS — BP 123/75 | HR 110 | Temp 98.1°F | Wt 198.0 lb

## 2019-05-12 DIAGNOSIS — F32A Depression, unspecified: Secondary | ICD-10-CM

## 2019-05-12 DIAGNOSIS — O0990 Supervision of high risk pregnancy, unspecified, unspecified trimester: Secondary | ICD-10-CM

## 2019-05-12 DIAGNOSIS — F329 Major depressive disorder, single episode, unspecified: Secondary | ICD-10-CM

## 2019-05-12 DIAGNOSIS — O09299 Supervision of pregnancy with other poor reproductive or obstetric history, unspecified trimester: Secondary | ICD-10-CM

## 2019-05-12 DIAGNOSIS — Z8659 Personal history of other mental and behavioral disorders: Secondary | ICD-10-CM

## 2019-05-12 NOTE — Progress Notes (Signed)
  PRENATAL VISIT NOTE  Subjective:  Colleen Chapman is a 24 y.o. [phone] at 103w3d being seen today for ongoing prenatal care.  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  Patient reports no complaints.  Contractions: Not present. Vag. Bleeding: None.  Movement: Present. Denies leaking of fluid/ROM.   The following portions of the patient's history were reviewed and updated as appropriate: allergies, current medications, past family history, past medical history, past social history, past surgical history and problem list. Problem list updated.  Objective:   Vitals:   05/12/19 1523  BP: 123/75  Pulse: (!) 110  Temp: 98.1 F (36.7 C)  Weight: 198 lb (89.8 kg)    Fetal Status: Fetal Heart Rate (bpm): 140 Fundal Height: 32 cm Movement: Present     General:  Alert, oriented and cooperative. Patient is in no acute distress.  Skin: Skin is warm and dry. No rash noted.   Cardiovascular: Normal heart rate noted  Respiratory: Normal respiratory effort, no problems with respiration noted  Abdomen: Soft, gravid, appropriate for gestational age.  Pain/Pressure: Absent     Pelvic: Cervical exam deferred        Extremities: Normal range of motion.  Edema: Trace  Mental Status: Normal mood and affect. Normal behavior. Normal judgment and thought content.   Assessment and Plan:  Pregnancy: [id number] at [redacted]w[redacted]d   1. Supervision of high risk pregnancy, antepartum -Up to date.  -Groin pain present at ER 3 days ago is now resolved.   2. Hx of preeclampsia, prior pregnancy 2017 -No s/sx today. HA present  at prior visits has gotten better w/ self-initiation of 12-16 oz coffee daily.   3. History of postpartum depression 2016 4. Depression, unspecified depression type -States she has been "moody," but is doing ok. Declines beh health referral.     Preterm labor symptoms and general obstetric precautions including but not limited to vaginal bleeding, contractions, leaking of fluid and fetal movement were reviewed in detail with the patient. Please refer to After Visit Summary for other counseling recommendations.  Return in about 2 weeks (around 05/26/2019) for routine prenatal care.  Future Appointments  Date Time Provider Department Center  05/26/2019 11:00 AM [facility] PROVIDER [facility] None    Ann Held, PA-C

## 2019-05-12 NOTE — Progress Notes (Signed)
In for visit; taking PNV; completed FMLA form given; @ [facility] 05/09/19 with groin pain Sharlette Dense, RN

## 2019-05-24 ENCOUNTER — Telehealth: Payer: Self-pay

## 2019-05-24 NOTE — Telephone Encounter (Signed)
F/U appt 05/26/19-need to change due to provider out; no answer, left voicemail message Sharlette Dense, RN

## 2019-05-26 ENCOUNTER — Ambulatory Visit: Payer: Medicaid Other

## 2019-05-27 NOTE — Addendum Note (Signed)
Addended by: Heywood Bene on: 05/27/2019 10:58 AM   Modules accepted: Orders

## 2019-05-31 ENCOUNTER — Other Ambulatory Visit: Payer: Self-pay

## 2019-05-31 ENCOUNTER — Ambulatory Visit: Payer: Medicaid Other | Admitting: Family Medicine

## 2019-05-31 ENCOUNTER — Encounter: Payer: Self-pay | Admitting: Family Medicine

## 2019-05-31 VITALS — BP 131/83 | HR 101 | Temp 97.0°F | Wt 206.8 lb

## 2019-05-31 DIAGNOSIS — R600 Localized edema: Secondary | ICD-10-CM

## 2019-05-31 DIAGNOSIS — Z8659 Personal history of other mental and behavioral disorders: Secondary | ICD-10-CM

## 2019-05-31 DIAGNOSIS — O09299 Supervision of pregnancy with other poor reproductive or obstetric history, unspecified trimester: Secondary | ICD-10-CM

## 2019-05-31 DIAGNOSIS — F32A Depression, unspecified: Secondary | ICD-10-CM

## 2019-05-31 DIAGNOSIS — F329 Major depressive disorder, single episode, unspecified: Secondary | ICD-10-CM

## 2019-05-31 DIAGNOSIS — O0990 Supervision of high risk pregnancy, unspecified, unspecified trimester: Secondary | ICD-10-CM

## 2019-05-31 LAB — URINALYSIS
Bilirubin, UA: POSITIVE — AB
Glucose, UA: NEGATIVE
Nitrite, UA: NEGATIVE
RBC, UA: NEGATIVE
Specific Gravity, UA: 1.025 (ref 1.005–1.030)
Urobilinogen, Ur: 1 mg/dL (ref 0.2–1.0)
pH, UA: 6 (ref 5.0–7.5)

## 2019-05-31 NOTE — Progress Notes (Signed)
In for visit; taking PNV; denies hospital visits since last appt Joelee Snoke, RN  

## 2019-05-31 NOTE — Progress Notes (Signed)
PRENATAL VISIT NOTE  Subjective:  Colleen Chapman is a 24 y.o. [phone] at 35w1dbeing seen today for ongoing prenatal care.  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 She is accompanied by her husband and 2 children today in clinic.  Patient reports bilateral LE edema x1 day. She sat in a car for 2 hrs yesterday. Denies calf pain. No abd pain, vision changes. She has chronic HAs but has not had these recently d/t reintroducing caffeine (two 12 oz bottles of [employer] frappucino/iced coffee daily) and taking tylenol PRN.   She has had painless, braxton hicks contractions on/off for past week.     Contractions: Not present. Vag. Bleeding: None.  Movement: Present. Denies leaking of fluid/ROM.   The following portions of the patient's history were reviewed and updated as appropriate: allergies, current medications, past family history, past medical history, past social history, past surgical history and problem list. Problem list updated.  Objective:   Vitals:   05/31/19 0952 05/31/19 1040  BP: 133/80 131/83  Pulse: (!) 101   Temp: (!) 97 F (36.1 C)   Weight: 206 lb 12.8 oz (93.8 kg)     Fetal Status: Fetal Heart Rate (bpm): 140 Fundal Height: 34 cm Movement: Present     General:  Alert, oriented and cooperative. Patient is in no acute distress.  Skin: Skin is warm and dry. No rash noted.   Cardiovascular: Normal heart rate noted  Respiratory: Normal respiratory effort, no problems with respiration noted  Abdomen: Soft, gravid, appropriate for gestational age.  Pain/Pressure: Absent      Pelvic: Cervical exam deferred        Extremities: Normal range of motion. LE: Edema: Mild pitting, slight indentation; no redness or tenderness of bilateral calves.  Mental Status: Normal mood and affect. Normal behavior. Normal judgment and thought content.   Assessment and Plan:  Pregnancy: [id number] 315w1d 1. Supervision of high risk pregnancy, antepartum -Up to date.   2. Lower extremity edema -As she has hx of preX and BP is slightly higher than previous checking labs as below today, close f/u in 1 wk.  -For LE edema: advised leg elevation, side lying positions, compression stockings (to be applied in a.m. or after elevation), regular exercise, avoid long standing or sitting, more water/less salt. -Reviewed s/sx of preeclampsia and advised to go to ER if present. Handout with information given as well.  -BP cuff ordered for self monitoring at home.  - Urinalysis (Urine Dip) - Protein / creatinine ratio, urine  (Spot) - PIH Panel ([facility] [id number] 3. Hx of preeclampsia, prior pregnancy 2017 -See above.  -She has not been taking aspirin.   4. History of postpartum depression 2016 5. Depression, unspecified depression type -Pt met with [facility] today.    Preterm labor symptoms and general obstetric precautions including but not limited to vaginal bleeding, contractions, leaking of fluid and fetal movement were reviewed in detail with the patient. Please refer to After Visit Summary for other counseling recommendations.  Return in about 1 week (around 06/07/2019) for routine prenatal care.  Future Appointments  Date Time Provider [place]4/07/2019  10:20 AM [facility] PROVIDER [facility] None    Kandee Keen, PA-C

## 2019-06-01 LAB — AST+BUN+CREAT+LD+URIC A+HGB...
AST: 19 IU/L (ref 0–40)
BUN: 6 mg/dL (ref 6–20)
Creatinine, Ser: 0.59 mg/dL (ref 0.57–1.00)
GFR calc Af Amer: 148 mL/min/{1.73_m2} (ref >59)
GFR calc non Af Amer: 129 mL/min/{1.73_m2} (ref >59)
Hematocrit: 42.9 % (ref 34.0–46.6)
Hemoglobin: 14.3 g/dL (ref 11.1–15.9)
LDH: 149 IU/L (ref 119–226)
Platelets: 238 10*3/uL (ref 150–450)
Uric Acid: 3.8 mg/dL (ref 2.6–6.2)

## 2019-06-01 LAB — PROTEIN / CREATININE RATIO, URINE
Creatinine, Urine: 212.3 mg/dL
Protein, Ur: 30.4 mg/dL
Protein/Creat Ratio: 143 mg/g creat (ref 0–200)

## 2019-06-07 ENCOUNTER — Ambulatory Visit: Payer: Medicaid Other

## 2019-06-15 ENCOUNTER — Other Ambulatory Visit: Payer: Self-pay

## 2019-06-15 ENCOUNTER — Ambulatory Visit: Payer: Medicaid Other | Admitting: Family Medicine

## 2019-06-15 ENCOUNTER — Encounter: Payer: Self-pay | Admitting: Family Medicine

## 2019-06-15 VITALS — BP 132/84 | HR 109 | Temp 97.4°F | Wt 213.2 lb

## 2019-06-15 DIAGNOSIS — O09299 Supervision of pregnancy with other poor reproductive or obstetric history, unspecified trimester: Secondary | ICD-10-CM

## 2019-06-15 DIAGNOSIS — Z8759 Personal history of other complications of pregnancy, childbirth and the puerperium: Secondary | ICD-10-CM

## 2019-06-15 DIAGNOSIS — O0990 Supervision of high risk pregnancy, unspecified, unspecified trimester: Secondary | ICD-10-CM

## 2019-06-15 DIAGNOSIS — Z8659 Personal history of other mental and behavioral disorders: Secondary | ICD-10-CM

## 2019-06-15 LAB — URINALYSIS
Bilirubin, UA: NEGATIVE
Glucose, UA: NEGATIVE
Leukocytes,UA: NEGATIVE
Nitrite, UA: NEGATIVE
RBC, UA: NEGATIVE
Specific Gravity, UA: 1.03 (ref 1.005–1.030)
Urobilinogen, Ur: 1 mg/dL (ref 0.2–1.0)
pH, UA: 6 (ref 5.0–7.5)

## 2019-06-15 NOTE — Progress Notes (Signed)
Here today for 36.2 week MH RV. Taking PNV QD, denies ED/hospital visits since last RV. 36 week labs and packet today. Tawny Hopping, RN

## 2019-06-15 NOTE — Progress Notes (Signed)
  PRENATAL VISIT NOTE  Subjective:  Colleen Chapman is a 24 y.o. [phone] at [redacted]w[redacted]d being seen today for ongoing prenatal care.  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  Patient reports no complaints. LE edema better than last visit - now just feet swollen if she doesn't elevate them, calf swelling has resolved. Denies severe HA, vision changes or RUQ pain.    Contractions: Not present. Vag. Bleeding: None.  Movement: Present. Denies leaking of fluid/ROM.   The following portions of the patient's history were reviewed and updated as appropriate: allergies, current medications, past family history, past medical history, past social history, past surgical history and problem list. Problem list updated.  Objective:   Vitals:   06/15/19 1047  BP: 132/84  Pulse: (!) 109  Temp: (!) 97.4 F (36.3 C)  Weight: 213 lb 3.2 oz (96.7 kg)    Fetal Status: Fetal Heart Rate (bpm): 150 Fundal Height: 37 cm Movement: Present  Presentation: Vertex  General:  Alert, oriented and cooperative. Patient is in no acute distress.  Skin: Skin is warm and dry. No rash noted.   Cardiovascular: Normal heart rate noted  Respiratory: Normal respiratory effort, no problems with respiration noted  Abdomen: Soft, gravid, appropriate for gestational age.  Pain/Pressure: Absent     Pelvic: Cervical exam performed        Extremities: Normal range of motion.  Edema: Mild pitting, slight indentation  Mental Status: Normal mood and affect. Normal behavior. Normal judgment and thought content.   Assessment and Plan:   Pregnancy: [id number] at [redacted]w[redacted]d   1. Supervision of high risk pregnancy, antepartum -36 wk labs today. Discussed q1 wk visits until 40 wks. - GBS Culture - Chlamydia/GC NAA, Confirmation  2. Hx of preeclampsia, prior pregnancy 2017 -Symptoms of LE edema have improved from last visit. BP stable, similar to last visit. PIH and UPCR drawn last visit resulted wnl.  -Reviewed s/sx of preeclampsia and to go to ER if present.  -BP cuff has been ordered, not yet received by pt.  - Urinalysis (Urine Dip)  3. History of postpartum depression 2016 -PHQ-9 score 2 today, pt states she is doing well.  -Discuss 2 wk PP mood check at next visit.    Preterm labor symptoms and general obstetric precautions including but not limited to vaginal bleeding, contractions, leaking of fluid and fetal movement were reviewed in detail with the patient. Please refer to After Visit Summary for other counseling recommendations.  Return in about 1 week (around 06/22/2019) for routine prenatal care.  Future Appointments  Date Time Provider Department Center  06/22/2019 11:00 AM [facility] PROVIDER [facility] None    Ann Held, PA-C

## 2019-06-16 ENCOUNTER — Other Ambulatory Visit: Payer: Self-pay

## 2019-06-16 ENCOUNTER — Emergency Department: Payer: Medicaid Other

## 2019-06-16 ENCOUNTER — Inpatient Hospital Stay
Admission: EM | Admit: 2019-06-16 | Discharge: 2019-06-16 | Disposition: A | Discharge status: Home / Self Care | Payer: Medicaid Other | Attending: Obstetrics and Gynecology | Admitting: Obstetrics and Gynecology

## 2019-06-16 DIAGNOSIS — R030 Elevated blood-pressure reading, without diagnosis of hypertension: Secondary | ICD-10-CM

## 2019-06-16 DIAGNOSIS — Z881 Allergy status to other antibiotic agents status: Secondary | ICD-10-CM | POA: Insufficient documentation

## 2019-06-16 DIAGNOSIS — O0990 Supervision of high risk pregnancy, unspecified, unspecified trimester: Secondary | ICD-10-CM

## 2019-06-16 DIAGNOSIS — R0789 Other chest pain: Secondary | ICD-10-CM | POA: Insufficient documentation

## 2019-06-16 DIAGNOSIS — M7989 Other specified soft tissue disorders: Secondary | ICD-10-CM

## 2019-06-16 DIAGNOSIS — O99891 Other specified diseases and conditions complicating pregnancy: Secondary | ICD-10-CM | POA: Diagnosis not present

## 2019-06-16 DIAGNOSIS — Z3A36 36 weeks gestation of pregnancy: Secondary | ICD-10-CM | POA: Insufficient documentation

## 2019-06-16 LAB — BASIC METABOLIC PANEL
Anion gap: 11 (ref 5–15)
BUN: 9 mg/dL (ref 6–20)
CO2: 21 mmol/L — ABNORMAL LOW (ref 22–32)
Calcium: 8.8 mg/dL — ABNORMAL LOW (ref 8.9–10.3)
Chloride: 104 mmol/L (ref 98–111)
Creatinine, Ser: 0.55 mg/dL (ref 0.44–1.00)
GFR calc Af Amer: >60 mL/min (ref >60)
GFR calc non Af Amer: >60 mL/min (ref >60)
Glucose, Bld: 115 mg/dL — ABNORMAL HIGH (ref 70–99)
Potassium: 3.8 mmol/L (ref 3.5–5.1)
Sodium: 136 mmol/L (ref 135–145)

## 2019-06-16 LAB — CBC
HCT: 40.1 % (ref 36.0–46.0)
Hemoglobin: 13.4 g/dL (ref 12.0–15.0)
MCH: 31.9 pg (ref 26.0–34.0)
MCHC: 33.4 g/dL (ref 30.0–36.0)
MCV: 95.5 fL (ref 80.0–100.0)
Platelets: 206 10*3/uL (ref 150–400)
RBC: 4.2 MIL/uL (ref 3.87–5.11)
RDW: 13.9 % (ref 11.5–15.5)
WBC: 11 10*3/uL — ABNORMAL HIGH (ref 4.0–10.5)
nRBC: 0 % (ref 0.0–0.2)

## 2019-06-16 LAB — URINALYSIS, COMPLETE (UACMP) WITH MICROSCOPIC
Bacteria, UA: NONE SEEN
Bilirubin Urine: NEGATIVE
Glucose, UA: NEGATIVE mg/dL
Hgb urine dipstick: NEGATIVE
Ketones, ur: NEGATIVE mg/dL
Nitrite: NEGATIVE
Protein, ur: NEGATIVE mg/dL
Specific Gravity, Urine: 1.024 (ref 1.005–1.030)
pH: 6 (ref 5.0–8.0)

## 2019-06-16 LAB — COMPREHENSIVE METABOLIC PANEL
ALT: 16 U/L (ref 0–44)
AST: 16 U/L (ref 15–41)
Albumin: 2.9 g/dL — ABNORMAL LOW (ref 3.5–5.0)
Alkaline Phosphatase: 90 U/L (ref 38–126)
Anion gap: 10 (ref 5–15)
BUN: 12 mg/dL (ref 6–20)
CO2: 21 mmol/L — ABNORMAL LOW (ref 22–32)
Calcium: 9.2 mg/dL (ref 8.9–10.3)
Chloride: 104 mmol/L (ref 98–111)
Creatinine, Ser: 0.44 mg/dL (ref 0.44–1.00)
GFR calc Af Amer: >60 mL/min (ref >60)
GFR calc non Af Amer: >60 mL/min (ref >60)
Glucose, Bld: 76 mg/dL (ref 70–99)
Potassium: 3.3 mmol/L — ABNORMAL LOW (ref 3.5–5.1)
Sodium: 135 mmol/L (ref 135–145)
Total Bilirubin: 0.7 mg/dL (ref 0.3–1.2)
Total Protein: 6.5 g/dL (ref 6.5–8.1)

## 2019-06-16 LAB — HEPATIC FUNCTION PANEL
ALT: 16 U/L (ref 0–44)
AST: 18 U/L (ref 15–41)
Albumin: 2.7 g/dL — ABNORMAL LOW (ref 3.5–5.0)
Alkaline Phosphatase: 86 U/L (ref 38–126)
Bilirubin, Direct: <0.1 mg/dL (ref 0.0–0.2)
Total Bilirubin: 0.8 mg/dL (ref 0.3–1.2)
Total Protein: 6.1 g/dL — ABNORMAL LOW (ref 6.5–8.1)

## 2019-06-16 LAB — PROTEIN / CREATININE RATIO, URINE
Creatinine, Urine: 157 mg/dL
Creatinine, Urine: 146 mg/dL
Protein Creatinine Ratio: 0.09 mg/mg{Cre} (ref 0.00–0.15)
Protein, Ur: 21.2 mg/dL
Protein/Creat Ratio: 135 mg/g creat (ref 0–200)
Total Protein, Urine: 13 mg/dL

## 2019-06-16 LAB — TROPONIN I (HIGH SENSITIVITY)
Troponin I (High Sensitivity): 4 ng/L (ref <18)
Troponin I (High Sensitivity): 3 ng/L (ref <18)

## 2019-06-16 MED ORDER — TECHNETIUM TO 99M ALBUMIN AGGREGATED
1.5000 | Freq: Once | INTRAVENOUS | Status: AC | PRN
  Administered 2019-06-16: 1.54 via INTRAVENOUS
  Filled 2019-06-16: qty 1.5

## 2019-06-16 NOTE — ED Triage Notes (Signed)
Pt comes POV for new chest pain starting this am. FHT at 151. Pt states it started this morning while cooking breakfast. Sharp, stabbing pain in heart. Pt 36 weeks.

## 2019-06-16 NOTE — ED Provider Notes (Signed)
[facility] Emergency Department Provider Note  ____________________________________________   First MD Initiated Contact with Patient 06/16/19 1315     (approximate)  I have reviewed the triage vital signs and the nursing notes.   HISTORY  Chief Complaint Chest Pain    HPI Colleen Chapman is a 24 y.o. female  G3P2 at estimated 36 wk with pregnancy c/b borderline PIH here with brief, resolved CP. Pt reports she was making breakfast this AM. She had bent down to put something in the oven when she experienced acute onset of a sharp, pressure like chest pain. This lasted approx 5 min, with mild SOB. It was somewhat postional. Now resolved and she's had no recurrent sx. She has, however, noticed slightly decreased fetal movements. She has also had increasing bilateral leg swelling with mild redness, and has gained >7 lb in past week. No urinary sx. No vaginal bleeding or LOF. She is now feeling baby move more. No h/o DVT/PE, ACS, or early heart disease in family. No h/O DVT with her prior pregnancies.        Past Medical History:  Diagnosis Date  . ADHD (attention deficit hyperactivity disorder)    ongoing  . Depression    "Because I was forced to have an abortion"  . History of urinary tract infection   . On Depo-Provera for contraception   . Pregnancy induced hypertension    Preeclampsia with 2017 delivery    Patient Active Problem List   Diagnosis Date Noted  . 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 Encounter for antenatal screening for chromosomal anomalies   . History of postpartum depression 2016 12/30/2018  . Hx of preeclampsia, prior pregnancy 2017 12/30/2018  . ADHD 12/30/2018  . Family history of deafness in mom 12/30/2018  . Supervision of high risk pregnancy, antepartum 12/30/2018  . BV (bacterial vaginosis) 12/30/2018  . Flu vaccine need 12/30/2018  . Depression 04/17/2018    Past Surgical History:  Procedure  Laterality Date  . EAB  01/2018  . NO PAST SURGERIES      Prior to Admission medications   Medication Sig Start Date End Date Taking? Authorizing Provider  acetaminophen (TYLENOL) 325 MG tablet Take 2 tablets (650 mg total) by mouth every 4 (four) hours as needed (for pain scale < 4  OR  temperature  >/=  100.5 F). 05/09/19   McVey, Prudencio Pair, CNM  Prenatal Vit-Fe Fumarate-FA (PRENATAL VITAMIN) 27-0.8 MG TABS Take 1 tablet by mouth daily at 6 (six) AM. 11/18/18   Federico Flake, MD    Allergies Bactrim [sulfamethoxazole-trimethoprim]  Family History  Problem Relation Age of Onset  . Bipolar disorder Mother   . Anemia Mother   . Deafness Mother   . Birth defects Mother        born without hand  . Diabetes Father   . Hypertension Father   . Asthma Father   . Cancer Paternal Grandmother        Skin Ca    Social History Social History   Tobacco Use  . Smoking status: Never Smoker  . Smokeless tobacco: Never Used  Substance Use Topics  . Alcohol use: Yes    Comment: Last ETOH use 10/2018 (wine cooler)  . Drug use: No    Review of Systems  Review of Systems  Constitutional: Positive for fatigue. Negative for fever.  HENT: Negative for congestion and sore throat.   Eyes: Negative for visual disturbance.  Respiratory: Positive  for chest tightness. Negative for cough and shortness of breath.   Cardiovascular: Positive for chest pain and leg swelling.  Gastrointestinal: Negative for abdominal pain, diarrhea, nausea and vomiting.  Genitourinary: Negative for flank pain.  Musculoskeletal: Negative for back pain and neck pain.  Skin: Negative for rash and wound.  Neurological: Negative for weakness.  All other systems reviewed and are negative.    ____________________________________________  PHYSICAL EXAM:      VITAL SIGNS: ED Triage Vitals  Enc Vitals Group     BP 06/16/19 1142 140/72     Pulse Rate 06/16/19 1142 97     Resp 06/16/19 1142 18     Temp 06/16/19  1148 98.4 F (36.9 C)     Temp Source 06/16/19 1148 Oral     SpO2 06/16/19 1142 99 %     Weight 06/16/19 1142 211 lb 10.3 oz (96 kg)     Height 06/16/19 1142 5\' 6"  (1.676 m)     Head Circumference --      Peak Flow --      Pain Score 06/16/19 1142 7     Pain Loc --      Pain Edu? --      Excl. in GC? --      Physical Exam Vitals and nursing note reviewed.  Constitutional:      General: She is not in acute distress.    Appearance: She is well-developed.  HENT:     Head: Normocephalic and atraumatic.  Eyes:     Conjunctiva/sclera: Conjunctivae normal.  Cardiovascular:     Rate and Rhythm: Normal rate and regular rhythm.     Heart sounds: Normal heart sounds.  Pulmonary:     Effort: Pulmonary effort is normal. No respiratory distress.     Breath sounds: No wheezing.  Chest:     Comments: Mild chest wall TTP over sternum Abdominal:     General: There is no distension.  Musculoskeletal:     Cervical back: Neck supple.     Right lower leg: Edema present.     Left lower leg: Edema present.  Skin:    General: Skin is warm.     Capillary Refill: Capillary refill takes less than 2 seconds.     Findings: No rash.  Neurological:     Mental Status: She is alert and oriented to person, place, and time.     Motor: No abnormal muscle tone.       ____________________________________________   LABS (all labs ordered are listed, but only abnormal results are displayed)  Labs Reviewed  BASIC METABOLIC PANEL - Abnormal; Notable for the following components:      Result Value   CO2 21 (*)    Glucose, Bld 115 (*)    Calcium 8.8 (*)    All other components within normal limits  CBC - Abnormal; Notable for the following components:   WBC 11.0 (*)    All other components within normal limits  HEPATIC FUNCTION PANEL - Abnormal; Notable for the following components:   Total Protein 6.1 (*)    Albumin 2.7 (*)    All other components within normal limits  URINALYSIS, COMPLETE  (UACMP) WITH MICROSCOPIC - Abnormal; Notable for the following components:   Color, Urine YELLOW (*)    APPearance HAZY (*)    Leukocytes,Ua SMALL (*)    All other components within normal limits  PROTEIN / CREATININE RATIO, URINE  TROPONIN I (HIGH SENSITIVITY)  TROPONIN I (HIGH SENSITIVITY)  ____________________________________________  EKG: Normal sinus rhythm, VR 89. PR 138, QRS 80, QTc 423. No acute ast elevations or depressions. No RBBB. Subtle TWI in inferior leads, likely non ischemic. ________________________________________  RADIOLOGY All imaging, including plain films, CT scans, and ultrasounds, independently reviewed by me, and interpretations confirmed via formal radiology reads.  ED MD interpretation:   CXR: Clear LE DVT study: Negative Perfusion scan: Negative  Official radiology report(s): DG Chest 2 View  Result Date: 06/16/2019 CLINICAL DATA:  Chest pain. EXAM: CHEST - 2 VIEW COMPARISON:  No pertinent prior studies available for comparison. FINDINGS: Heart size within normal limits. There is no evidence of airspace consolidation within the lungs. No evidence of pleural effusion or pneumothorax. No acute bony abnormality. IMPRESSION: No evidence of acute cardiopulmonary abnormality. Electronically Signed   By: Kellie Simmering DO   On: 06/16/2019 14:43   NM Pulmonary Perfusion  Result Date: 06/16/2019 CLINICAL DATA:  Shortness of breath, chest pain, [redacted] weeks pregnant, question pulmonary embolism EXAM: NUCLEAR MEDICINE PERFUSION LUNG SCAN TECHNIQUE: Perfusion images were obtained in multiple projections after intravenous injection of radiopharmaceutical. Ventilation scans intentionally deferred if perfusion scan and chest x-ray adequate for interpretation during COVID 19 epidemic. RADIOPHARMACEUTICALS:  1.54 mCi Tc-29m MAA IV COMPARISON:  None Correlation: Chest radiograph 06/16/2019 FINDINGS: Normal perfusion lung scan. IMPRESSION: Normal perfusion lung scan.  Electronically Signed   By: Lavonia Dana M.D.   On: 06/16/2019 17:20   US Venous Img Lower Bilateral  Result Date: 06/16/2019 CLINICAL DATA:  Swelling, bilateral. [redacted] weeks pregnant. Chest pain. EXAM: BILATERAL LOWER EXTREMITY VENOUS DOPPLER ULTRASOUND TECHNIQUE: Gray-scale sonography with compression, as well as color and duplex ultrasound, were performed to evaluate the deep venous system(s) from the level of the common femoral vein through the popliteal and proximal calf veins. COMPARISON:  None. FINDINGS: VENOUS Normal compressibility of the common femoral, superficial femoral, and popliteal veins, as well as the visualized calf veins. Visualized portions of profunda femoral vein and great saphenous vein unremarkable. No filling defects to suggest DVT on grayscale or color Doppler imaging. Doppler waveforms show normal direction of venous flow, normal respiratory phasicity and response to augmentation. OTHER None. Limitations: none IMPRESSION: No femoropopliteal DVT nor evidence of DVT within the visualized calf veins. If clinical symptoms are inconsistent or if there are persistent or worsening symptoms, further imaging (possibly involving the iliac veins) may be warranted. Electronically Signed   By: Lucrezia Europe M.D.   On: 06/16/2019 16:33    ____________________________________________  PROCEDURES   Procedure(s) performed (including Critical Care):  Procedures  ____________________________________________  INITIAL IMPRESSION / MDM / [place] / ED COURSE  As part of my medical decision making, I reviewed the following data within the [place] notes reviewed and incorporated, Old chart reviewed, Notes from prior ED visits, and [place] Controlled Substance Database       *Colleen Chapman was evaluated in Emergency Department on 06/16/2019 for the symptoms described in the history of present illness. She was evaluated in the context of the global COVID-19  pandemic, which necessitated consideration that the patient might be at risk for infection with the SARS-CoV-2 virus that causes COVID-19. Institutional protocols and algorithms that pertain to the evaluation of patients at risk for COVID-19 are in a state of rapid change based on information released by regulatory bodies including the CDC and federal and state organizations. These policies and algorithms were followed during the patient's care in the ED.  Some ED evaluations  and interventions may be delayed as a result of limited staffing during the pandemic.*     Medical Decision Making: 24 year old female here with chest pain and decreased fetal movement.  Regarding her chest pain, I suspect this is musculoskeletal.  It began while she was making breakfast and leaning down to put something in the oven.  Given that she is third trimester pregnancy with reported leg swelling and pain, work-up for DVT/PE obtained and is unremarkable.  DVT studies are negative.  Perfusion scan negative.  She is not tachycardic, tachypneic, and I see no evidence to suggest PE or pneumonia clinically.  Otherwise, lab work is overall reassuring.  She has no significant electrolyte abnormalities.  Of note, she is borderline hypertensive here with leg swelling and headaches.  I have added on preeclampsia labs.  I also discussed with OB, and will send to labor and delivery for fetal monitoring.  Of note, she does have a history of requiring induction in the past for preeclampsia.  ____________________________________________  FINAL CLINICAL IMPRESSION(S) / ED DIAGNOSES  Final diagnoses:  Other chest pain  Leg swelling  Borderline hypertension     MEDICATIONS GIVEN DURING THIS VISIT:  Medications  technetium albumin aggregated (MAA) injection solution 1.5 millicurie (1.54 millicuries Intravenous Contrast Given 06/16/19 1634)     ED Discharge Orders    None       Note:  This document was prepared using Dragon  voice recognition software and may include unintentional dictation errors.   Shaune Pollack, MD 06/16/19 1744

## 2019-06-16 NOTE — ED Notes (Signed)
Pt transported to LDR at this time.

## 2019-06-16 NOTE — Progress Notes (Signed)
Pt G4P2 [redacted]w[redacted]d. ER work up done for chest pain. Cardiac ruled out. Pt reports HA and swelling. +FM. Denies LOF/CTX/bleeding. Reflexes +2. No clonus. BP cycling. No vision issues. VSS. Monitors applied. Will continue to monitor

## 2019-06-16 NOTE — Progress Notes (Signed)
Colleen Chapman is a 24 y.o. female. She is at [redacted]w[redacted]d gestation. Patient's last menstrual period was 10/03/2018 (within days). Estimated Date of Delivery: 07/11/19 Pt seen in ED today for Chest pain . Negative workup and was sent to L+D  Denies h/s , vision change  Prenatal care site: [facility] Chief complaint:  Location: Onset/timing: Duration: Quality:  Severity: Aggravating or alleviating conditions: Associated signs/symptoms: Context:  S: Resting comfortably. no CTX, no VB.no LOF,  Active fetal movement.  Maternal Medical History:   Past Medical History:  Diagnosis Date  . ADHD (attention deficit hyperactivity disorder)    ongoing  . Depression    "Because I was forced to have an abortion"  . History of urinary tract infection   . On Depo-Provera for contraception   . Pregnancy induced hypertension    Preeclampsia with 2017 delivery    Past Surgical History:  Procedure Laterality Date  . EAB  01/2018  . NO PAST SURGERIES      Allergies  Allergen Reactions  . Bactrim [Sulfamethoxazole-Trimethoprim] Rash    Prior to Admission medications   Medication Sig Start Date End Date Taking? Authorizing Provider  acetaminophen (TYLENOL) 325 MG tablet Take 2 tablets (650 mg total) by mouth every 4 (four) hours as needed (for pain scale < 4  OR  temperature  >/=  100.5 F). 05/09/19  Yes McVey, Prudencio Pair, CNM  Prenatal Vit-Fe Fumarate-FA (PRENATAL VITAMIN) 27-0.8 MG TABS Take 1 tablet by mouth daily at 6 (six) AM. 11/18/18  Yes Federico Flake, MD     Social History: She  reports that she has never smoked. She has never used smokeless tobacco. She reports current alcohol use. She reports that she does not use drugs.  Family History: family history includes Anemia in her mother; Asthma in her father; Bipolar disorder in her mother; Birth defects in her mother; Cancer in her paternal grandmother; Deafness in her mother; Diabetes in her father; Hypertension in  her father.  no history of gyn cancers  Review of Systems: A full review of systems was performed and negative except as noted in the HPI.     O:  BP 136/75 (BP Location: Left Arm)   Pulse 99   Temp 98.3 F (36.8 C) (Tympanic)   Resp 18   Ht 5\' 6"  (1.676 m)   Wt 96.6 kg   LMP 10/03/2018 (Within Days)   SpO2 99%   BMI 34.38 kg/m  Results for orders placed or performed during the hospital encounter of 06/16/19 (from the past 48 hour(s))  Basic metabolic panel   Collection Time: 06/16/19 11:47 AM  Result Value Ref Range   Sodium 136 135 - 145 mmol/L   Potassium 3.8 3.5 - 5.1 mmol/L   Chloride 104 98 - 111 mmol/L   CO2 21 (L) 22 - 32 mmol/L   Glucose, Bld 115 (H) 70 - 99 mg/dL   BUN 9 6 - 20 mg/dL   Creatinine, Ser 06/18/19 0.44 - 1.00 mg/dL   Calcium 8.8 (L) 8.9 - 10.3 mg/dL   GFR calc non Af Amer >60 >60 mL/min   GFR calc Af Amer >60 >60 mL/min   Anion gap 11 5 - 15  CBC   Collection Time: 06/16/19 11:47 AM  Result Value Ref Range   WBC 11.0 (H) 4.0 - 10.5 K/uL   RBC 4.20 3.87 - 5.11 MIL/uL   Hemoglobin 13.4 12.0 - 15.0 g/dL   HCT 06/18/19 25.0 - 53.9 %  MCV 95.5 80.0 - 100.0 fL   MCH 31.9 26.0 - 34.0 pg   MCHC 33.4 30.0 - 36.0 g/dL   RDW 13.9 11.5 - 15.5 %   Platelets 206 150 - 400 K/uL   nRBC 0.0 0.0 - 0.2 %  Troponin I (High Sensitivity)   Collection Time: 06/16/19 11:47 AM  Result Value Ref Range   Troponin I (High Sensitivity) 4 <18 ng/L  Hepatic function panel   Collection Time: 06/16/19  2:22 PM  Result Value Ref Range   Total Protein 6.1 (L) 6.5 - 8.1 g/dL   Albumin 2.7 (L) 3.5 - 5.0 g/dL   AST 18 15 - 41 U/L   ALT 16 0 - 44 U/L   Alkaline Phosphatase 86 38 - 126 U/L   Total Bilirubin 0.8 0.3 - 1.2 mg/dL   Bilirubin, Direct <0.1 0.0 - 0.2 mg/dL   Indirect Bilirubin NOT CALCULATED 0.3 - 0.9 mg/dL  Troponin I (High Sensitivity)   Collection Time: 06/16/19  2:22 PM  Result Value Ref Range   Troponin I (High Sensitivity) 3 <18 ng/L  Protein / creatinine  ratio, urine   Collection Time: 06/16/19  3:09 PM  Result Value Ref Range   Creatinine, Urine 146 mg/dL   Total Protein, Urine 13 mg/dL   Protein Creatinine Ratio 0.09 0.00 - 0.15 mg/mg[Cre]  Urinalysis, Complete w Microscopic   Collection Time: 06/16/19  3:09 PM  Result Value Ref Range   Color, Urine YELLOW (A) YELLOW   APPearance HAZY (A) CLEAR   Specific Gravity, Urine 1.024 1.005 - 1.030   pH 6.0 5.0 - 8.0   Glucose, UA NEGATIVE NEGATIVE mg/dL   Hgb urine dipstick NEGATIVE NEGATIVE   Bilirubin Urine NEGATIVE NEGATIVE   Ketones, ur NEGATIVE NEGATIVE mg/dL   Protein, ur NEGATIVE NEGATIVE mg/dL   Nitrite NEGATIVE NEGATIVE   Leukocytes,Ua SMALL (A) NEGATIVE   RBC / HPF 0-5 0 - 5 RBC/hpf   WBC, UA 11-20 0 - 5 WBC/hpf   Bacteria, UA NONE SEEN NONE SEEN   Squamous Epithelial / LPF 6-10 0 - 5   Mucus PRESENT   Comprehensive metabolic panel   Collection Time: 06/16/19  6:27 PM  Result Value Ref Range   Sodium 135 135 - 145 mmol/L   Potassium 3.3 (L) 3.5 - 5.1 mmol/L   Chloride 104 98 - 111 mmol/L   CO2 21 (L) 22 - 32 mmol/L   Glucose, Bld 76 70 - 99 mg/dL   BUN 12 6 - 20 mg/dL   Creatinine, Ser 0.44 0.44 - 1.00 mg/dL   Calcium 9.2 8.9 - 10.3 mg/dL   Total Protein 6.5 6.5 - 8.1 g/dL   Albumin 2.9 (L) 3.5 - 5.0 g/dL   AST 16 15 - 41 U/L   ALT 16 0 - 44 U/L   Alkaline Phosphatase 90 38 - 126 U/L   Total Bilirubin 0.7 0.3 - 1.2 mg/dL   GFR calc non Af Amer >60 >60 mL/min   GFR calc Af Amer >60 >60 mL/min   Anion gap 10 5 - 15  Results for orders placed or performed in visit on 06/15/19 (from the past 48 hour(s))  Protein / creatinine ratio, urine  (Spot)   Collection Time: 06/15/19 11:30 AM  Result Value Ref Range   Creatinine, Urine 157.0 Not Estab. mg/dL   Protein, Ur 21.2 Not Estab. mg/dL   Protein/Creat Ratio 135 0 - 200 mg/g creat  Urinalysis (Urine Dip)   Collection Time: 06/15/19  2:30 PM  Result Value Ref Range   Specific Gravity, UA 1.030 1.005 - 1.030   pH, UA  6.0 5.0 - 7.5   Color, UA Yellow Yellow   Appearance Ur Clear Clear   Leukocytes,UA Negative Negative   Protein,UA 1+ (A) Negative/Trace   Glucose, UA Negative Negative   Ketones, UA Trace (A) Negative   RBC, UA Negative Negative   Bilirubin, UA Negative Negative   Urobilinogen, Ur 1.0 0.2 - 1.0 mg/dL   Nitrite, UA Negative Negative     Constitutional: NAD, AAOx3  HE/ENT: extraocular movements grossly intact, moist mucous membranes CV: RRR PULM: nl respiratory effort, CTABL     Abd: gravid, non-tender, non-distended, soft      Ext: Non-tender, Nonedmeatous   Psych: mood appropriate, speech normal Pelvic deferred  NST: Reactive  Baseline: 130 Variability: moderate Accelerations present x >2 Decelerations absent Time    Assessment: 24 y.o. [redacted]w[redacted]d here for antenatal surveillance during pregnancy.  Principle diagnosis: chest pain resolved , negative work up in ED  Reassuring fetal monitoring   Plan:  Labor: not present.   Fetal Wellbeing: Reassuring Cat 1 tracing.  Reactive NST   D/c home stable, precautions reviewed, follow-up as scheduled.   ----- Beverly Gust MD Attending Obstetrician and Gynecologist [facility], Department of OB/GYN [facility] Patient ID: Bevelyn Arriola, female   DOB: [birth year], 24 y.o.   MRN: [id number]

## 2019-06-16 NOTE — Discharge Instructions (Signed)
Take tylenol 500 to 1000 mg every 6 hours for chest pain  Go directly to [facility] and Delivery upstairs for evaluation of your decreased fetal movement  Your XRay, labs, and imaging for chest pain, including work up for blood clot, were all normal today

## 2019-06-16 NOTE — ED Triage Notes (Signed)
FIRST NURSE NOTE- pt here for chest pain and decreased fetal movement. Spoke with L&D and they would like chest pain ruled out with FHT.  If FHT concerning they would like pt sent upstairs first.

## 2019-06-16 NOTE — Discharge Summary (Signed)
Subjective  Colleen Chapman is a 24 y.o. female. She is at [redacted]w[redacted]d gestation. Patient's last menstrual period was 10/03/2018 (within days).  Estimated Date of Delivery: 07/11/19  Pt seen in ED today for Chest pain . Negative workup and was sent to L+D  Denies h/s , vision change  Prenatal care site: [facility] Chief complaint:  Location:  Onset/timing:  Duration:  Quality:  Severity:  Aggravating or alleviating conditions:  Associated signs/symptoms:  Context:  S: Resting comfortably. no CTX, no VB.no LOF, Active fetal movement.  Maternal Medical History:       Past Medical History:  Diagnosis Date  . ADHD (attention deficit hyperactivity disorder)    ongoing  . Depression    "Because I was forced to have an abortion"  . History of urinary tract infection   . On Depo-Provera for contraception   . Pregnancy induced hypertension    Preeclampsia with 2017 delivery        Past Surgical History:  Procedure Laterality Date  . EAB  01/2018  . NO PAST SURGERIES         Allergies  Allergen Reactions  . Bactrim [Sulfamethoxazole-Trimethoprim] Rash          Prior to Admission medications   Medication Sig Start Date End Date Taking? Authorizing Provider  acetaminophen (TYLENOL) 325 MG tablet Take 2 tablets (650 mg total) by mouth every 4 (four) hours as needed (for pain scale < 4 OR temperature >/= 100.5 F). 05/09/19  Yes McVey, Prudencio Pair, CNM  Prenatal Vit-Fe Fumarate-FA (PRENATAL VITAMIN) 27-0.8 MG TABS Take 1 tablet by mouth daily at 6 (six) AM. 11/18/18  Yes Federico Flake, MD  Social History: She reports that she has never smoked. She has never used smokeless tobacco. She reports current alcohol use. She reports that she does not use drugs.  Family History: family history includes Anemia in her mother; Asthma in her father; Bipolar disorder in her mother; Birth defects in her mother; Cancer in her paternal grandmother; Deafness in her mother; Diabetes in her  father; Hypertension in her father. no history of gyn cancers  Review of Systems: A full review of systems was performed and negative except as noted in the HPI.  O:  Objective  BP 136/75 (BP Location: Left Arm)  Pulse 99  Temp 98.3 F (36.8 C) (Tympanic)  Resp 18  Ht 5\' 6"  (1.676 m)  Wt 96.6 kg  LMP 10/03/2018 (Within Days)  SpO2 99%  BMI 34.38 kg/m       Results for orders placed or performed during the hospital encounter of 06/16/19 (from the past 48 hour(s))  Basic metabolic panel   Collection Time: 06/16/19 11:47 AM  Result Value Ref Range   Sodium 136 135 - 145 mmol/L   Potassium 3.8 3.5 - 5.1 mmol/L   Chloride 104 98 - 111 mmol/L   CO2 21 (L) 22 - 32 mmol/L   Glucose, Bld 115 (H) 70 - 99 mg/dL   BUN 9 6 - 20 mg/dL   Creatinine, Ser 06/18/19 0.44 - 1.00 mg/dL   Calcium 8.8 (L) 8.9 - 10.3 mg/dL   GFR calc non Af Amer >60 >60 mL/min   GFR calc Af Amer >60 >60 mL/min   Anion gap 11 5 - 15  CBC   Collection Time: 06/16/19 11:47 AM  Result Value Ref Range   WBC 11.0 (H) 4.0 - 10.5 K/uL   RBC 4.20 3.87 - 5.11 MIL/uL   Hemoglobin 13.4  12.0 - 15.0 g/dL   HCT 44.0 34.7 - 42.5 %   MCV 95.5 80.0 - 100.0 fL   MCH 31.9 26.0 - 34.0 pg   MCHC 33.4 30.0 - 36.0 g/dL   RDW 95.6 38.7 - 56.4 %   Platelets 206 150 - 400 K/uL   nRBC 0.0 0.0 - 0.2 %  Troponin I (High Sensitivity)   Collection Time: 06/16/19 11:47 AM  Result Value Ref Range   Troponin I (High Sensitivity) 4 <18 ng/L  Hepatic function panel   Collection Time: 06/16/19 2:22 PM  Result Value Ref Range   Total Protein 6.1 (L) 6.5 - 8.1 g/dL   Albumin 2.7 (L) 3.5 - 5.0 g/dL   AST 18 15 - 41 U/L   ALT 16 0 - 44 U/L   Alkaline Phosphatase 86 38 - 126 U/L   Total Bilirubin 0.8 0.3 - 1.2 mg/dL   Bilirubin, Direct <3.3 0.0 - 0.2 mg/dL   Indirect Bilirubin NOT CALCULATED 0.3 - 0.9 mg/dL  Troponin I (High Sensitivity)   Collection Time: 06/16/19 2:22 PM  Result Value Ref Range   Troponin I (High Sensitivity) 3 <18 ng/L    Protein / creatinine ratio, urine   Collection Time: 06/16/19 3:09 PM  Result Value Ref Range   Creatinine, Urine 146 mg/dL   Total Protein, Urine 13 mg/dL   Protein Creatinine Ratio 0.09 0.00 - 0.15 mg/mg[Cre]  Urinalysis, Complete w Microscopic   Collection Time: 06/16/19 3:09 PM  Result Value Ref Range   Color, Urine YELLOW (A) YELLOW   APPearance HAZY (A) CLEAR   Specific Gravity, Urine 1.024 1.005 - 1.030   pH 6.0 5.0 - 8.0   Glucose, UA NEGATIVE NEGATIVE mg/dL   Hgb urine dipstick NEGATIVE NEGATIVE   Bilirubin Urine NEGATIVE NEGATIVE   Ketones, ur NEGATIVE NEGATIVE mg/dL   Protein, ur NEGATIVE NEGATIVE mg/dL   Nitrite NEGATIVE NEGATIVE   Leukocytes,Ua SMALL (A) NEGATIVE   RBC / HPF 0-5 0 - 5 RBC/hpf   WBC, UA 11-20 0 - 5 WBC/hpf   Bacteria, UA NONE SEEN NONE SEEN   Squamous Epithelial / LPF 6-10 0 - 5   Mucus PRESENT   Comprehensive metabolic panel   Collection Time: 06/16/19 6:27 PM  Result Value Ref Range   Sodium 135 135 - 145 mmol/L   Potassium 3.3 (L) 3.5 - 5.1 mmol/L   Chloride 104 98 - 111 mmol/L   CO2 21 (L) 22 - 32 mmol/L   Glucose, Bld 76 70 - 99 mg/dL   BUN 12 6 - 20 mg/dL   Creatinine, Ser 2.95 0.44 - 1.00 mg/dL   Calcium 9.2 8.9 - 18.8 mg/dL   Total Protein 6.5 6.5 - 8.1 g/dL   Albumin 2.9 (L) 3.5 - 5.0 g/dL   AST 16 15 - 41 U/L   ALT 16 0 - 44 U/L   Alkaline Phosphatase 90 38 - 126 U/L   Total Bilirubin 0.7 0.3 - 1.2 mg/dL   GFR calc non Af Amer >60 >60 mL/min   GFR calc Af Amer >60 >60 mL/min   Anion gap 10 5 - 15  Results for orders placed or performed in visit on 06/15/19 (from the past 48 hour(s))  Protein / creatinine ratio, urine (Spot)   Collection Time: 06/15/19 11:30 AM  Result Value Ref Range   Creatinine, Urine 157.0 Not Estab. mg/dL   Protein, Ur 41.6 Not Estab. mg/dL   Protein/Creat Ratio 606 0 - 200 mg/g creat  Urinalysis (Urine Dip)   Collection Time: 06/15/19 2:30 PM  Result Value Ref Range   Specific Gravity, UA 1.030 1.005  - 1.030   pH, UA 6.0 5.0 - 7.5   Color, UA Yellow Yellow   Appearance Ur Clear Clear   Leukocytes,UA Negative Negative   Protein,UA 1+ (A) Negative/Trace   Glucose, UA Negative Negative   Ketones, UA Trace (A) Negative   RBC, UA Negative Negative   Bilirubin, UA Negative Negative   Urobilinogen, Ur 1.0 0.2 - 1.0 mg/dL   Nitrite, UA Negative Negative   Constitutional: NAD, AAOx3  HE/ENT: extraocular movements grossly intact, moist mucous membranes  CV: RRR  PULM: nl respiratory effort, CTABL  Abd: gravid, non-tender, non-distended, soft  Ext: Non-tender, Nonedmeatous  Psych: mood appropriate, speech normal  Pelvic deferred  NST: Reactive  Baseline: 130  Variability: moderate  Accelerations present x >2  Decelerations absent  Time 2mins  Assessment: 24 y.o. [redacted]w[redacted]d here for antenatal surveillance during pregnancy.  Principle diagnosis: chest pain resolved , negative work up in ED  Reassuring fetal monitoring  Plan:  Labor: not present.  Fetal Wellbeing: Reassuring Cat 1 tracing.  Reactive NST  D/c home stable, precautions reviewed, follow-up as scheduled.  -----  Huel Cote MD  Attending Obstetrician and Gynecologist  [facility], Department of OB/GYN  [facility]  Patient ID: Colleen Chapman, female DOB: 19-Jul-1995, 24 y.o. MRN: [id number]

## 2019-06-17 ENCOUNTER — Telehealth: Payer: Self-pay | Admitting: Family Medicine

## 2019-06-17 LAB — CHLAMYDIA/GC NAA, CONFIRMATION
Chlamydia trachomatis, NAA: NEGATIVE
Neisseria gonorrhoeae, NAA: NEGATIVE

## 2019-06-17 NOTE — Telephone Encounter (Signed)
Patient needs to speak to nurse, she was at ER yesterday, and today she is having some kind of leg pain that is not going away.

## 2019-06-17 NOTE — Telephone Encounter (Signed)
Returned call; reports after left hospital yesterday began having pain in leg/hip & was worse when woke this am & has had all day, difficulty walking; reports good FM; states has tried Tylenol without relief; rates pain 9.  Will consult and phone back-agrees to plan Sharlette Dense, RN

## 2019-06-17 NOTE — Telephone Encounter (Signed)
5:10p TC to pt to f/u with sx as listed on earlier phone call. Pt stated she had hip pain yest and went to the ER, was eval and "all the tests were good". Pain improved somewhat, but when she awoke today, the R hip pain was back and she has trouble walking due to discomfort. No new sx. Has taken acetaminophen without relief. I encouraged her to try local ice for 20 min, and if sx worsen or not imporving, rec return to hosp for further eval. She states understanding.

## 2019-06-19 LAB — CULTURE, BETA STREP (GROUP B ONLY): Strep Gp B Culture: NEGATIVE

## 2019-06-22 ENCOUNTER — Ambulatory Visit: Payer: Medicaid Other | Admitting: Family Medicine

## 2019-06-22 ENCOUNTER — Other Ambulatory Visit: Payer: Self-pay

## 2019-06-22 ENCOUNTER — Encounter: Payer: Self-pay | Admitting: Family Medicine

## 2019-06-22 VITALS — BP 118/76 | Temp 98.2°F | Wt 212.4 lb

## 2019-06-22 DIAGNOSIS — Z8659 Personal history of other mental and behavioral disorders: Secondary | ICD-10-CM

## 2019-06-22 DIAGNOSIS — O0990 Supervision of high risk pregnancy, unspecified, unspecified trimester: Secondary | ICD-10-CM

## 2019-06-22 DIAGNOSIS — O09299 Supervision of pregnancy with other poor reproductive or obstetric history, unspecified trimester: Secondary | ICD-10-CM

## 2019-06-22 NOTE — Progress Notes (Signed)
Patient here with family for MH RV at 37 2/7. BH consent signed, needs 2 week PP mood check appointment per provider.Burt Knack, RN

## 2019-06-22 NOTE — Progress Notes (Signed)
  PRENATAL VISIT NOTE  Subjective:  Colleen Chapman is a 24 y.o. [phone] at [redacted]w[redacted]d being seen today for ongoing prenatal care.  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  Patient reports was seen in ER 4/14 w/chest pain. Workup there included CXR, venous doppler, pulm perfersion - all negative. She states chest pain has resolved. The following day she had hip pain, she believes d/t moving from bed to bed in hospital. Resolved w/ice and tylenol.    Contractions: Not present. Vag. Bleeding: None.  Movement: Present. Denies leaking of fluid/ROM.   The following portions of the patient's history were reviewed and updated as appropriate: allergies, current medications, past family history, past medical history, past social history, past surgical history and problem list. Problem list updated.  Objective:   Vitals:   06/22/19 1107  BP: 118/76  Temp: 98.2 F (36.8 C)  Weight: 212 lb 6.4 oz (96.3 kg)    Fetal Status: Fetal Heart Rate (bpm): 150 Fundal Height: 37 cm Movement: Present  Presentation: Vertex  General:  Alert, oriented and cooperative. Patient is in no acute distress.  Skin: Skin is warm and dry. No rash noted.   Cardiovascular: Normal heart rate noted  Respiratory: Normal respiratory effort, no problems with respiration noted  Abdomen: Soft, gravid, appropriate for gestational age.  Pain/Pressure: Absent     Pelvic: Cervical exam deferred        Extremities: Normal range of motion.  Edema: Mild pitting, slight indentation  Mental Status: Normal mood and affect. Normal behavior. Normal  judgment and thought content.   Assessment and Plan:  Pregnancy: [id number] at [redacted]w[redacted]d   1. Supervision of high risk pregnancy, antepartum -Chest and hip pain from ER visit 4/14 now resolved.  -Reviewed 36 wk labs = wnl -Reviewed how to get in touch with [facility] hospital, discussed when to call. -Ready for baby at home, has carseat. -Has signed up for [facility] and is using this.  -Reviewed breastfeeding support at [facility] and in [facility] clinic.  -Reviewed contraceptive plans and patient still desires depo.  -Is unsure if planning on circumcision for infant. Discussed this today.  2. Hx of preeclampsia, prior pregnancy 2017 -No s/sx today, BP wnl. Pt aware of signs and to seek medical care asap if present.   3. History of postpartum depression 2016 -Pt agrees to and signed consent for 2 wk pp mood check.  -States mood has been "highs and lows" but states she hasn't felt very low recently.    Term labor symptoms and general obstetric precautions including but not limited to vaginal bleeding, contractions, leaking of fluid and fetal movement were reviewed in detail with the patient. Please refer to After Visit Summary for other counseling recommendations.  Return in about 1 week (around 06/29/2019) for routine prenatal care.  Future Appointments  Date Time Provider Department Center  06/29/2019 10:40 AM [facility] PROVIDER [facility] None    Ann Held, PA-C

## 2019-06-24 NOTE — Telephone Encounter (Signed)
Pt needs the OB provider to call [phone] [employer] to verify that she is taking maternity leave.

## 2019-06-26 ENCOUNTER — Other Ambulatory Visit: Payer: Self-pay

## 2019-06-26 ENCOUNTER — Encounter: Payer: Self-pay | Admitting: Obstetrics and Gynecology

## 2019-06-26 ENCOUNTER — Observation Stay
Admission: EM | Admit: 2019-06-26 | Discharge: 2019-06-26 | Disposition: A | Discharge status: Home / Self Care | Payer: Medicaid Other | Attending: Certified Nurse Midwife | Admitting: Certified Nurse Midwife

## 2019-06-26 DIAGNOSIS — O0990 Supervision of high risk pregnancy, unspecified, unspecified trimester: Secondary | ICD-10-CM

## 2019-06-26 DIAGNOSIS — O479 False labor, unspecified: Secondary | ICD-10-CM | POA: Diagnosis present

## 2019-06-26 DIAGNOSIS — O3463 Maternal care for abnormality of vagina, third trimester: Secondary | ICD-10-CM | POA: Diagnosis not present

## 2019-06-26 DIAGNOSIS — Z8759 Personal history of other complications of pregnancy, childbirth and the puerperium: Secondary | ICD-10-CM | POA: Insufficient documentation

## 2019-06-26 DIAGNOSIS — R102 Pelvic and perineal pain: Secondary | ICD-10-CM | POA: Insufficient documentation

## 2019-06-26 DIAGNOSIS — Z3A37 37 weeks gestation of pregnancy: Secondary | ICD-10-CM | POA: Diagnosis not present

## 2019-06-26 NOTE — Progress Notes (Signed)
[place] CNM notified of pt arrival to unit. Orders given to check cervix and watch for Labor Evaluation.

## 2019-06-26 NOTE — OB Triage Note (Signed)
Discharge instructions reviewed with patient and patient verbalized understanding. Pt instructed when to call or return for evaluation of labor. Pt denies any questions at this time and discharged home in stable condition with significant other.

## 2019-06-26 NOTE — OB Triage Note (Addendum)
Pt is a 23yo G4P2 at [redacted]w[redacted]d that presents from ED with ctx that started around 0800 this morning. Pt states "I woke up with pain in the left side of my vagina and couldn't go back to sleep. I just kept getting cramps." Pt states " My mother said the contractions were every 1 1/2 minutes." Pt denies VB, LOF and states positive FM. Pt admits to not eating or drinking all day so RN provided Large pitcher of water for oral hydration. Pt states two episodes of loose stool this am but states she can't remember her last normal BM. Initial fht 135 with EFM applied. Vitals Stable.

## 2019-06-26 NOTE — Discharge Summary (Signed)
Colleen Chapman is a 24 y.o. female. She is at [redacted]w[redacted]d gestation. Patient's last menstrual period was 10/03/2018 (within days). Estimated Date of Delivery: 07/11/19  Prenatal care site:  [facility]   Chief complaint: vaginal pain Location: left side of pelvis/vagina Onset/timing: today around 0800 Duration: constant Quality: pain/pressure Severity: moderate Aggravating or alleviating conditions: none Associated signs/symptoms: cramping/contractions Context: Patient reports that she woke up this morning around 0800 with pain/pressure on the left side of her vagina. She also reports cramping, which her mom said was contractions. She has not had much, if anything, to eat or drink this morning. She had some loose stool this morning, but can't remember the last time that she moved her bowels before that.   S: Resting comfortably.   She reports:  -active fetal movement -no leakage of fluid -no vaginal bleeding  Maternal Medical History:   Past Medical History:  Diagnosis Date  . ADHD (attention deficit hyperactivity disorder)    ongoing  . Depression    "Because I was forced to have an abortion"  . History of urinary tract infection   . On Depo-Provera for contraception   . Pregnancy induced hypertension    Preeclampsia with 2017 delivery    Past Surgical History:  Procedure Laterality Date  . EAB  01/2018  . NO PAST SURGERIES      Allergies  Allergen Reactions  . Bactrim [Sulfamethoxazole-Trimethoprim] Rash    Prior to Admission medications   Medication Sig Start Date End Date Taking? Authorizing Provider  acetaminophen (TYLENOL) 325 MG tablet Take 2 tablets (650 mg total) by mouth every 4 (four) hours as needed (for pain scale < 4  OR  temperature  >/=  100.5 F). 05/09/19  Yes McVey, Prudencio Pair, CNM  Prenatal Vit-Fe Fumarate-FA (PRENATAL VITAMIN) 27-0.8 MG TABS Take 1 tablet by mouth daily at 6 (six) AM. 11/18/18  Yes Federico Flake, MD     Social History: She   reports that she has never smoked. She has never used smokeless tobacco. She reports current alcohol use. She reports that she does not use drugs.  Family History: family history includes Anemia in her mother; Asthma in her father; Bipolar disorder in her mother; Birth defects in her mother; Cancer in her paternal grandmother; Deafness in her mother; Diabetes in her father; Hypertension in her father.   Review of Systems: A full review of systems was performed and negative except as noted in the HPI.     O:  BP 132/76 (BP Location: Right Arm)   Pulse 83   Temp 98.1 F (36.7 C) (Oral)   Resp 14   Ht 5\' 6"  (1.676 m)   Wt 96.2 kg   LMP 10/03/2018 (Within Days)   BMI 34.22 kg/m  No results found for this or any previous visit (from the past 48 hour(s)).   Constitutional: NAD, AAOx3  HE/ENT: extraocular movements grossly intact, moist mucous membranes PULM: normal respiratory effort    Abd: gravid, non-tender, non-distended, soft      Ext: Non-tender, Nonedmeatous   Psych: mood appropriate, speech normal Pelvic: SVE by RN 1/thick/ballotable, unchanged over 2 hours  NST/monitoring:  Baseline: 145 Variability: moderate Accelerations: 15x15 present regularly Decelerations: absent Time: 12/03/2018 Toco: q3-55m to occasional/uterine irritability   A/P: 24 y.o. 102w6d here for antenatal surveillance during pregnancy.  Principle diagnosis: contractions during pregnancy  Labor  Possibly early labor, no active labor  No cervical change over 2 hours  Fetal Wellbeing  Reactive NST, reassuring  for GA  D/c home stable, precautions reviewed, follow-up as scheduled.    Lisette Grinder 06/26/2019 12:45 PM  ----- Lisette Grinder, CNM Certified Nurse Midwife [facility], Department of [facility]

## 2019-06-29 ENCOUNTER — Encounter: Payer: Self-pay | Admitting: Family Medicine

## 2019-06-29 ENCOUNTER — Ambulatory Visit: Payer: Medicaid Other | Admitting: Family Medicine

## 2019-06-29 ENCOUNTER — Other Ambulatory Visit: Payer: Self-pay

## 2019-06-29 VITALS — BP 120/70 | HR 94 | Temp 97.0°F | Wt 215.4 lb

## 2019-06-29 DIAGNOSIS — Z8759 Personal history of other complications of pregnancy, childbirth and the puerperium: Secondary | ICD-10-CM

## 2019-06-29 DIAGNOSIS — O0990 Supervision of high risk pregnancy, unspecified, unspecified trimester: Secondary | ICD-10-CM

## 2019-06-29 DIAGNOSIS — O09299 Supervision of pregnancy with other poor reproductive or obstetric history, unspecified trimester: Secondary | ICD-10-CM

## 2019-06-29 DIAGNOSIS — Z8659 Personal history of other mental and behavioral disorders: Secondary | ICD-10-CM

## 2019-06-29 NOTE — Progress Notes (Signed)
UA on 06/16/2019 was negative. Taking PNV QD and prn Tylenol. Jossie Ng, RN  IOL referral and demographic info faxed to [facility] with fax confirmation received. Jossie Ng, RN

## 2019-06-29 NOTE — Progress Notes (Signed)
  PRENATAL VISIT NOTE  Subjective:  Colleen Chapman is a 24 y.o. [phone] at [redacted]w[redacted]d being seen today for ongoing prenatal care.  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  Patient reports no complaints. Contractions from ER visit 4/24 have resolved.    Contractions: Not present. Vag. Bleeding: None.  Movement: Present. Denies leaking of fluid/ROM.   The following portions of the patient's history were reviewed and updated as appropriate: allergies, current medications, past family history, past medical history, past social history, past surgical history and problem list. Problem list updated.  Objective:   Vitals:   06/29/19 1111  BP: 120/70  Pulse: 94  Temp: (!) 97 F (36.1 C)  Weight: 215 lb 6.4 oz (97.7 kg)    Fetal Status: Fetal Heart Rate (bpm): 160 Fundal Height: 39 cm Movement: Present  Presentation: Vertex  General:  Alert, oriented and cooperative. Patient is in no acute distress.  Skin: Skin is warm and dry. No rash noted.   Cardiovascular: Normal heart rate noted  Respiratory: Normal respiratory effort, no problems with respiration noted  Abdomen: Soft, gravid, appropriate for gestational age.  Pain/Pressure: Absent     Pelvic: Cervical exam performed Dilation: 1 Effacement (%): Thick Station: Ballotable. Posterior.  Extremities: Normal range of motion.  Edema: Mild pitting, slight indentation  Mental Status: Normal mood and affect. Normal behavior. Normal judgment and thought content.   Assessment and Plan:  Pregnancy: [id number] at  [redacted]w[redacted]d   1. Supervision of high risk pregnancy, antepartum -IOL form completed today, date 07/18/19 chosen which is at [redacted] wks gestation. -Discussed membrane sweeping today but is not dilated enough to perform.   2. History of postpartum depression 2016 -Pt is set for 2 wk pp mood check w/LSCW Kathreen Cosier.   3. Hx of preeclampsia, prior pregnancy 2017 -BP wnl, no s/sx of pre-e. Advised to go to hospital if present.     Term labor symptoms and general obstetric precautions including but not limited to vaginal bleeding, contractions, leaking of fluid and fetal movement were reviewed in detail with the patient. Please refer to After Visit Summary for other counseling recommendations.  Return in about 1 week (around 07/06/2019) for routine prenatal care.  Future Appointments  Date Time Provider Department Center  07/06/2019 10:40 AM [facility] PROVIDER [facility] None    Ann Held, PA-C

## 2019-07-06 ENCOUNTER — Other Ambulatory Visit: Payer: Self-pay | Admitting: Obstetrics and Gynecology

## 2019-07-06 ENCOUNTER — Ambulatory Visit: Payer: Medicaid Other | Admitting: Family Medicine

## 2019-07-06 ENCOUNTER — Other Ambulatory Visit: Payer: Self-pay

## 2019-07-06 ENCOUNTER — Encounter: Payer: Self-pay | Admitting: Family Medicine

## 2019-07-06 ENCOUNTER — Telehealth: Payer: Self-pay

## 2019-07-06 VITALS — BP 132/81 | HR 102 | Temp 96.9°F | Wt 216.6 lb

## 2019-07-06 DIAGNOSIS — O0990 Supervision of high risk pregnancy, unspecified, unspecified trimester: Secondary | ICD-10-CM

## 2019-07-06 DIAGNOSIS — Z8659 Personal history of other mental and behavioral disorders: Secondary | ICD-10-CM

## 2019-07-06 DIAGNOSIS — O09299 Supervision of pregnancy with other poor reproductive or obstetric history, unspecified trimester: Secondary | ICD-10-CM

## 2019-07-06 NOTE — Telephone Encounter (Signed)
Per provider Maximiano Coss, PA-C patient needs to return 07/12/2019 for RV appt. Phone call to patient and rescheduled RV appt to 07/12/2019 @ 2:20. Instructed patient to arrive at 2:00 for check in. Tawny Hopping, RN

## 2019-07-06 NOTE — Progress Notes (Signed)
Phone call to [facility] regarding IOL referral. Per Kaitlyn IOL referral and records are being reviewed at this time and date and time of IOL should be faxed by this afternoon. Tawny Hopping, RN

## 2019-07-06 NOTE — Progress Notes (Signed)
  PRENATAL VISIT NOTE  Subjective:  Colleen Chapman is a 24 y.o. [phone] at [redacted]w[redacted]d being seen today for ongoing prenatal care.  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  Patient reports no complaints. Headaches from earlier in pregnancy have resolved, no longer drinking caffeine. Contractions: Irregular. Vag. Bleeding: None.  Movement: Present. Denies leaking of fluid/ROM.   The following portions of the patient's history were reviewed and updated as appropriate: allergies, current medications, past family history, past medical history, past social history, past surgical history and problem list. Problem list updated.  Objective:   Vitals:   07/06/19 1044  BP: 132/81  Pulse: (!) 102  Temp: (!) 96.9 F (36.1 C)  Weight: 216 lb 9.6 oz (98.2 kg)    Fetal Status: Fetal Heart Rate (bpm): 150 Fundal Height: 40 cm Movement: Present  Presentation: Vertex  General:  Alert, oriented and cooperative. Patient is in no acute distress.  Skin: Skin is warm and dry. No rash noted.   Cardiovascular: Normal heart rate noted  Respiratory: Normal respiratory effort, no problems with respiration noted  Abdomen: Soft, gravid, appropriate for gestational age.  Pain/Pressure: Absent     Pelvic: Cervical exam performed Dilation: 3 Effacement (%): 50 Station: -3  Extremities: Normal range of motion.  Edema: Mild pitting, slight indentation  Mental Status: Normal mood and affect. Normal behavior. Normal judgment and thought content.   Assessment and Plan:  Pregnancy: [id number]  at [redacted]w[redacted]d   1. Supervision of high risk pregnancy, antepartum -IOL form completed last visit, re-faxed yesterday. -Discussed membrane sweeping today. Reviewed cochrane review data on membrane sweeping at 39 wks and then at EDD. Reviewed risk of cramping, contractions, bleeding and ROM. Answered patient questions and pt agrees to proceed with procedure. -Reviewed how to get in touch with hospital, discussed when to call. -Ready for baby at home, has carseat.  2. Hx of preeclampsia, prior pregnancy 2017 -BP 132/81. No s/sx of preeclampsia. Will schedule for f/u in 3 days for close monitoring of BP.  -Advised to go to ER if symptoms present.   3. History of postpartum depression 2016 -Pt has agreed to 2 wk pp mood check   Term labor symptoms and general obstetric precautions including but not limited to vaginal bleeding, contractions, leaking of fluid and fetal movement were reviewed in detail with the patient. Please refer to After Visit Summary for other counseling recommendations.  Return in about 3 days (around 07/09/2019) for routine prenatal care.  Future Appointments  Date Time Provider Department Center  07/09/2019  1:20 PM [facility] PROVIDER [facility] None    Ann Held, PA-C

## 2019-07-06 NOTE — Progress Notes (Addendum)
Here today for 39.2 week MH RV. Taking PNV QD. Denies ED/hospital visits since 06/26/19. [facility] IOL referral faxed x2 06/29/2019 and 07/05/2019. Tawny Hopping, RN

## 2019-07-06 NOTE — Progress Notes (Signed)
[facility] pt for IOL at 41wks, scheduled 07/18/19 at 0800   Nursing Staff Provider  Office Location  [facility] Dating  LMP 13  Language  English Anatomy US  WNL, anterior, 3VC.   Flu Vaccine   12/30/2018 Genetic Screen  AFP: declined   First Screen  done 01/04/19 =neg  TDaP vaccine   04/14/2019 Hgb A1C or  GTT Third trimester   Rhogam   NA   LAB RESULTS   Feeding Plan Bottle Blood Type B/Positive/-- (10/28 1030)   Contraception Depo Antibody Positive, See Final Results (10/28 1030)  Circumcision  Rubella  MMR x2  Pediatrician  [facility] RPR Non Reactive (10/28 1030)   Support Person  HBsAg Negative (10/28 1030)   Prenatal Classes  HIV Non-reactive (10/28 0000)  Doula Referral @28 wk?  Varicella Varicella vaccine x2  BTL Consent  GBS  (For PCN allergy, check sensitivities)        VBAC Consent  NA Pap  NIL 2018    Hgb Electro    BP Cuff ordered  CF   Delivery Group  [facility] SMA   Centering Group NA      

## 2019-07-08 ENCOUNTER — Observation Stay
Admission: EM | Admit: 2019-07-08 | Discharge: 2019-07-08 | Disposition: A | Discharge status: Home / Self Care | Payer: Medicaid Other | Attending: Obstetrics and Gynecology | Admitting: Obstetrics and Gynecology

## 2019-07-08 ENCOUNTER — Encounter: Payer: Self-pay | Admitting: Obstetrics and Gynecology

## 2019-07-08 ENCOUNTER — Other Ambulatory Visit: Payer: Self-pay

## 2019-07-08 DIAGNOSIS — F329 Major depressive disorder, single episode, unspecified: Secondary | ICD-10-CM | POA: Insufficient documentation

## 2019-07-08 DIAGNOSIS — O26893 Other specified pregnancy related conditions, third trimester: Secondary | ICD-10-CM | POA: Diagnosis not present

## 2019-07-08 DIAGNOSIS — O26899 Other specified pregnancy related conditions, unspecified trimester: Secondary | ICD-10-CM | POA: Diagnosis present

## 2019-07-08 DIAGNOSIS — O0990 Supervision of high risk pregnancy, unspecified, unspecified trimester: Secondary | ICD-10-CM

## 2019-07-08 DIAGNOSIS — F909 Attention-deficit hyperactivity disorder, unspecified type: Secondary | ICD-10-CM | POA: Insufficient documentation

## 2019-07-08 DIAGNOSIS — R102 Pelvic and perineal pain: Secondary | ICD-10-CM | POA: Diagnosis present

## 2019-07-08 DIAGNOSIS — Z3A39 39 weeks gestation of pregnancy: Secondary | ICD-10-CM | POA: Insufficient documentation

## 2019-07-08 DIAGNOSIS — O99343 Other mental disorders complicating pregnancy, third trimester: Secondary | ICD-10-CM | POA: Diagnosis not present

## 2019-07-08 LAB — URINALYSIS, COMPLETE (UACMP) WITH MICROSCOPIC
Bilirubin Urine: NEGATIVE
Glucose, UA: NEGATIVE mg/dL
Hgb urine dipstick: NEGATIVE
Ketones, ur: NEGATIVE mg/dL
Nitrite: NEGATIVE
Protein, ur: NEGATIVE mg/dL
Specific Gravity, Urine: 1.017 (ref 1.005–1.030)
pH: 6 (ref 5.0–8.0)

## 2019-07-08 NOTE — Progress Notes (Signed)
Pt discharged home per R.McVey, CNM. Pt received AVS and discharge instructions. Pt received labor and bleeding precautions and explained when to return to the ED or call the OBGYN office. All questions answered by RN. Pt scheduled for IOL 07/18/2019. Pt ambulatory and in stable condition d/c home.

## 2019-07-08 NOTE — OB Triage Note (Signed)
Pt presents to the ED reporting abdominal pain. Pt is a 24 y/o [id number] [redacted]w[redacted]d. Pt denies leaking of fluid or vaginal bleeding, reports positive fetal movement and occasional contractions. Pt reports lower abdominal and vaginal pressure. Pt reports this discomfort 10/10 on the pain scale. Pt also reports intermittent headache that alleviates with tylenol PO. Pt denies blurry vision or RUQ pain. +2 reflexes and no clonus. External monitors applied and assessing, initial FHT 145. VS WNL.

## 2019-07-08 NOTE — Discharge Summary (Signed)
Colleen Chapman is a 25 y.o. female. [id number] at [redacted]w[redacted]d gestation. Patient's last menstrual period was 10/03/2018 (within days). Estimated Date of Delivery: 07/11/19  Prenatal care site:  [facility]   Current pregnancy complicated by:  1. Prior hx PPD 2016 2. Hx preeclampsia with previous delivery.  3. Positive Antibody screen: Anti-M   Chief complaint: pelvic and abdominal pain  Location: vagina/pelvis and lower abdomen Onset/timing:for several weeks, but worsened today.  Duration:constant Quality: sharp and shooting pain with dep pressure- "like a bowling ball in vagina" Severity: 10/10 Aggravating or alleviating conditions: pain makes it difficult to walk.  Associated signs/symptoms: mild intermittent HA, alleviated with tylenol.  Context: last seen at [facility] on 5/4 with membrane sweep.   S: Resting comfortably. no CTX, no VB.no LOF,  Active fetal movement.  Denies: HA, visual changes, SOB, or RUQ/epigastric pain  Maternal Medical History:   Past Medical History:  Diagnosis Date  . ADHD (attention deficit hyperactivity disorder)    ongoing  . Depression    "Because I was forced to have an abortion"  . History of urinary tract infection   . On Depo-Provera for contraception   . Pregnancy induced hypertension    Preeclampsia with 2017 delivery    Past Surgical History:  Procedure Laterality Date  . EAB  01/2018  . NO PAST SURGERIES      Allergies  Allergen Reactions  . Bactrim [Sulfamethoxazole-Trimethoprim] Rash    Prior to Admission medications   Medication Sig Start Date End Date Taking? Authorizing Provider  acetaminophen (TYLENOL) 325 MG tablet Take 2 tablets (650 mg total) by mouth every 4 (four) hours as needed (for pain scale < 4  OR  temperature  >/=  100.5 F). 05/09/19  Yes Reeve Turnley, Prudencio Pair, CNM  Prenatal Vit-Fe Fumarate-FA (PRENATAL VITAMIN) 27-0.8 MG TABS Take 1 tablet by mouth daily at 6 (six) AM. 11/18/18  Yes Federico Flake, MD      Social  History: She  reports that she has never smoked. She has never used smokeless tobacco. She reports current alcohol use. She reports that she does not use drugs.  Family History: family history includes Anemia in her mother; Asthma in her father; Bipolar disorder in her mother; Birth defects in her mother; Cancer in her paternal grandmother; Deafness in her mother; Diabetes in her father; Hypertension in her father.   Review of Systems: A full review of systems was performed and negative except as noted in the HPI.     O:  BP 125/74 (BP Location: Right Arm)   Pulse 93   Temp 98.1 F (36.7 C)   LMP 10/03/2018 (Within Days)  Results for orders placed or performed during the hospital encounter of 07/08/19 (from the past 48 hour(s))  Urinalysis, Complete w Microscopic   Collection Time: 07/08/19  1:32 PM  Result Value Ref Range   Color, Urine YELLOW (A) YELLOW   APPearance CLOUDY (A) CLEAR   Specific Gravity, Urine 1.017 1.005 - 1.030   pH 6.0 5.0 - 8.0   Glucose, UA NEGATIVE NEGATIVE mg/dL   Hgb urine dipstick NEGATIVE NEGATIVE   Bilirubin Urine NEGATIVE NEGATIVE   Ketones, ur NEGATIVE NEGATIVE mg/dL   Protein, ur NEGATIVE NEGATIVE mg/dL   Nitrite NEGATIVE NEGATIVE   Leukocytes,Ua MODERATE (A) NEGATIVE   RBC / HPF 0-5 0 - 5 RBC/hpf   WBC, UA 11-20 0 - 5 WBC/hpf   Bacteria, UA RARE (A) NONE SEEN   Squamous Epithelial / LPF 11-20 0 -  5   Mucus PRESENT      Constitutional: NAD, AAOx3  HE/ENT: extraocular movements grossly intact, moist mucous membranes CV: RRR PULM: nl respiratory effort     Abd: gravid, non-tender, non-distended, soft      Ext: Non-tender, Nonedematous   Psych: mood appropriate, speech normal Pelvic: SVE: 1/long/-3, ballotable, medium/posterior.    Fetal  monitoring: Cat I Appropriate for GA, Reactive NST Baseline: 140bpm Variability: moderate Accelerations: present x >2 Decelerations absent Time 11mins    A/P: 24 y.o. [redacted]w[redacted]d here for antenatal  surveillance for round ligament pain  Principle Diagnosis: 39wks, ligament pain   Labor: not present.   Fetal Wellbeing: Reassuring Cat 1 tracing with Reactive NST  Discussed comfort measures for ligament pain, reviewed hydration, tylenol and rest.   Reviewed UA- pending culture.    D/c home stable, precautions reviewed, follow-up as scheduled.    Francetta Found, CNM 07/08/2019  9:44 PM

## 2019-07-09 ENCOUNTER — Ambulatory Visit: Payer: Medicaid Other | Admitting: Family Medicine

## 2019-07-09 VITALS — BP 135/71 | HR 90 | Temp 97.6°F | Wt 218.2 lb

## 2019-07-09 DIAGNOSIS — Z8659 Personal history of other mental and behavioral disorders: Secondary | ICD-10-CM

## 2019-07-09 DIAGNOSIS — O0990 Supervision of high risk pregnancy, unspecified, unspecified trimester: Secondary | ICD-10-CM

## 2019-07-09 DIAGNOSIS — O09299 Supervision of pregnancy with other poor reproductive or obstetric history, unspecified trimester: Secondary | ICD-10-CM

## 2019-07-09 NOTE — Progress Notes (Signed)
  PRENATAL VISIT NOTE  Subjective:  Colleen Chapman is a 24 y.o. [phone] at [redacted]w[redacted]d being seen today for ongoing prenatal care.  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  Patient reports she went to ER y/day d/t abd pain. Found to be not in labor, was discharged. BP at that visit 125/74 and urine w/out protein. Upon questioning states for past two days states she has had very faint HA (3/10 pain) on/off. Took tylenol x1 yesterday. She has hx of chronic HAs, current HA not as bad as past HAs. Denies vision changes or upper abd pain. Feet swelling same as prior.    Contractions: Irregular. Vag. Bleeding: None.  Movement: Present. Denies leaking of fluid/ROM.   The following portions of the patient's history were reviewed and updated as appropriate: allergies, current medications, past family history, past medical history, past social history, past surgical history and problem list. Problem list updated.  Objective:   Vitals:   07/09/19 1302  BP: 135/71  Pulse: 90  Temp: 97.6 F (36.4 C)  Weight: 218 lb 3.2 oz (99 kg)    Fetal Status: Fetal Heart Rate (bpm): 150 Fundal Height: 40 cm Movement: Present  Presentation: Vertex  General:  Alert, oriented and cooperative. Patient is in no acute distress.  Skin: Skin is warm and dry. No rash noted.   Cardiovascular: Normal heart rate noted  Respiratory: Normal respiratory effort, no problems with respiration noted  Abdomen: Soft, gravid, appropriate for gestational age.   Pain/Pressure: Absent     Pelvic: Cervical exam performed Dilation: 3 Effacement (%): 70 Station: -3  Extremities: Normal range of motion.  Edema: Mild pitting, slight indentation  Mental Status: Normal mood and affect. Normal behavior. Normal judgment and thought content.   Assessment and Plan:  Pregnancy: [id number] at [redacted]w[redacted]d   1. Supervision of high risk pregnancy, antepartum -IOL date 5/16. -Discussed membrane sweeping today. Reviewed cochrane review data on membrane sweeping at 39 wks and then at EDD. Reviewed risk of cramping, contractions, bleeding and ROM. Answered patient questions and she agreed to proceed with procedure.  2. Hx of preeclampsia, prior pregnancy 2017 -BP 135/71, similar to last visit (though was 125/74 yesterday in ER). Faint HA. Denies vision changes. No increased edema. Urine y/day w/out protein. -Close f/u in 3 days to follow BP. Advised if HA worsens/fails to improve to go to ER. Reviewed s/sx of preeclampsia, advised to go to ER if present.   3. History of postpartum depression 2016 -Has pp 2 wk mood check in scheduled.    Term labor symptoms and general obstetric precautions including but not limited to vaginal bleeding, contractions, leaking of fluid and fetal movement were reviewed in detail with the patient. Please refer to After Visit Summary for other counseling recommendations.  Return in about 3 days (around 07/12/2019) for routine prenatal care.  Future Appointments  Date Time Provider Department Center  07/13/2019  9:20 AM [facility] PROVIDER [facility] None    Ann Held, PA-C

## 2019-07-09 NOTE — Progress Notes (Signed)
In for visit; @ L & D yesterday & has IOL scheduled 07/18/19 Sharlette Dense, RN

## 2019-07-10 ENCOUNTER — Encounter: Payer: Self-pay | Admitting: Family Medicine

## 2019-07-11 ENCOUNTER — Observation Stay
Admission: EM | Admit: 2019-07-11 | Discharge: 2019-07-12 | Disposition: A | Discharge status: Home / Self Care | Payer: Medicaid Other | Source: Home / Self Care | Admitting: Obstetrics and Gynecology

## 2019-07-11 ENCOUNTER — Encounter: Payer: Self-pay | Admitting: Obstetrics and Gynecology

## 2019-07-11 DIAGNOSIS — Z79899 Other long term (current) drug therapy: Secondary | ICD-10-CM | POA: Insufficient documentation

## 2019-07-11 DIAGNOSIS — Z3A40 40 weeks gestation of pregnancy: Secondary | ICD-10-CM | POA: Insufficient documentation

## 2019-07-11 DIAGNOSIS — O479 False labor, unspecified: Secondary | ICD-10-CM | POA: Diagnosis present

## 2019-07-11 DIAGNOSIS — O471 False labor at or after 37 completed weeks of gestation: Secondary | ICD-10-CM | POA: Insufficient documentation

## 2019-07-11 DIAGNOSIS — O0990 Supervision of high risk pregnancy, unspecified, unspecified trimester: Secondary | ICD-10-CM

## 2019-07-11 LAB — CULTURE, OB URINE: Culture: 50000 — AB

## 2019-07-11 NOTE — OB Triage Note (Signed)
Reviewed D/C information and labor precautions.  Will f/u with next scheduled clinic visit 07/13/19.  D/C to home

## 2019-07-11 NOTE — Discharge Planning (Addendum)
   Colleen Chapman is a 24 y.o. female. She is at [redacted]w[redacted]d gestation. Patient's last menstrual period was 10/03/2018 (within days). Estimated Date of Delivery: 07/11/19  Prenatal care site:  [facility]   Chief complaint: losing her mucous plug Location: vagina Severity: none Aggravating or alleviating conditions: none Associated signs/symptoms: irregular contractions Context: Colleen Chapman reports that she lost her mucous plug. She also reports irregular contractions.   S: Resting comfortably.   She reports:  -active fetal movement -no leakage of fluid -no vaginal bleeding -irregular contractions  Maternal Medical History:   Past Medical History:  Diagnosis Date  . ADHD (attention deficit hyperactivity disorder)    ongoing  . Depression    "Because I was forced to have an abortion"  . History of urinary tract infection   . On Depo-Provera for contraception   . Pregnancy induced hypertension    Preeclampsia with 2017 delivery    Past Surgical History:  Procedure Laterality Date  . EAB  01/2018  . NO PAST SURGERIES      Allergies  Allergen Reactions  . Bactrim [Sulfamethoxazole-Trimethoprim] Rash    Prior to Admission medications   Medication Sig Start Date End Date Taking? Authorizing Provider  acetaminophen (TYLENOL) 325 MG tablet Take 2 tablets (650 mg total) by mouth every 4 (four) hours as needed (for pain scale < 4  OR  temperature  >/=  100.5 F). 05/09/19  Yes McVey, Murray Hodgkins, CNM  Prenatal Vit-Fe Fumarate-FA (PRENATAL VITAMIN) 27-0.8 MG TABS Take 1 tablet by mouth daily at 6 (six) AM. 11/18/18  Yes Caren Macadam, MD     Social History: She  reports that she has never smoked. She has never used smokeless tobacco. She reports current alcohol use. She reports that she does not use drugs.  Family History: family history includes Anemia in her mother; Asthma in her father; Bipolar disorder in her mother; Birth defects in her mother; Cancer in her paternal grandmother;  Deafness in her mother; Diabetes in her father; Hypertension in her father.    O:  BP 134/78 (BP Location: Left Arm)   Pulse 97   Temp 98.1 F (36.7 C) (Oral)   Resp 20   Ht 5\' 6"  (1.676 m)   Wt 98.9 kg   LMP 10/03/2018 (Within Days)   BMI 35.19 kg/m  No results found for this or any previous visit (from the past 48 hour(s)).   Pelvic: SVE per RN 1.5cm/30%/ballotable middle/soft, external os 3cm; no change since last exam  NST:  Baseline: 135bpm  Variability: moderate Accelerations: 15x15 present x >2 Decelerations: absent Time: 72mins Toco: occasional   A/P: 24 y.o. [redacted]w[redacted]d here for antenatal surveillance during pregnancy.  Principle diagnosis: mucous discharge  Labor  Not present  Fetal Wellbeing  Reactive NST, reassuring for GA  D/c home stable, precautions reviewed, follow-up as scheduled.    Patient presented for evaluation of labor.  Patient had cervical exam by RN and this was reported to me. I reviewed her vital signs and fetal tracing, both of which were reassuring.  Patient was discharged as she was not laboring.   Lisette Grinder 07/11/2019 11:42 PM  ----- Lisette Grinder, CNM Certified Nurse Midwife [facility], Department of [facility]

## 2019-07-11 NOTE — OB Triage Note (Signed)
Pt. Presents with c/o of loosing her mucous plug with a small amt. Of spotting. + FM, no VB and no LOF

## 2019-07-12 ENCOUNTER — Ambulatory Visit: Payer: Self-pay

## 2019-07-12 NOTE — Discharge Summary (Signed)
Colleen Chapman is a 24 y.o. female. She is at [redacted]w[redacted]d gestation. Patient's last menstrual period was 10/03/2018 (within days). Estimated Date of Delivery: 07/11/19  Prenatal care site:  [facility]   Chief complaint: losing her mucous plug Location: vagina Severity: none Aggravating or alleviating conditions: none Associated signs/symptoms: irregular contractions Context: Colleen Chapman reports that she lost her mucous plug. She also reports irregular contractions.   S: Resting comfortably.   She reports:  -active fetal movement -no leakage of fluid -no vaginal bleeding -irregular contractions  Maternal Medical History:       Past Medical History:  Diagnosis Date  . ADHD (attention deficit hyperactivity disorder)    ongoing  . Depression    "Because I was forced to have an abortion"  . History of urinary tract infection   . On Depo-Provera for contraception   . Pregnancy induced hypertension    Preeclampsia with 2017 delivery         Past Surgical History:  Procedure Laterality Date  . EAB  01/2018  . NO PAST SURGERIES          Allergies  Allergen Reactions  . Bactrim [Sulfamethoxazole-Trimethoprim] Rash           Prior to Admission medications   Medication Sig Start Date End Date Taking? Authorizing Provider  acetaminophen (TYLENOL) 325 MG tablet Take 2 tablets (650 mg total) by mouth every 4 (four) hours as needed (for pain scale < 4  OR  temperature  >/=  100.5 F). 05/09/19  Yes McVey, Prudencio Pair, CNM  Prenatal Vit-Fe Fumarate-FA (PRENATAL VITAMIN) 27-0.8 MG TABS Take 1 tablet by mouth daily at 6 (six) AM. 11/18/18  Yes Federico Flake, MD     Social History: She  reports that she has never smoked. She has never used smokeless tobacco. She reports current alcohol use. She reports that she does not use drugs.  Family History: family history includes Anemia in her mother; Asthma in her father; Bipolar disorder in her mother; Birth defects  in her mother; Cancer in her paternal grandmother; Deafness in her mother; Diabetes in her father; Hypertension in her father.    O:  BP 134/78 (BP Location: Left Arm)   Pulse 97   Temp 98.1 F (36.7 C) (Oral)   Resp 20   Ht 5\' 6"  (1.676 m)   Wt 98.9 kg   LMP 10/03/2018 (Within Days)   BMI 35.19 kg/m  No results found for this or any previous visit (from the past 48 hour(s)).   Pelvic: SVE per RN 1.5cm/30%/ballotable middle/soft, external os 3cm; no change since last exam  NST:  Baseline: 135bpm  Variability: moderate Accelerations: 15x15 present x >2 Decelerations: absent Time: 12/03/2018 Toco: occasional   A/P: 24 y.o. [redacted]w[redacted]d here for antenatal surveillance during pregnancy.  Principle diagnosis: mucous discharge  Labor ? Not present  Fetal Wellbeing ? Reactive NST, reassuring for GA  D/c home stable, precautions reviewed, follow-up as scheduled.    Patient presented for evaluation of labor.  Patient had cervical exam by RN and this was reported to me. I reviewed her vital signs and fetal tracing, both of which were reassuring.  Patient was discharged as she was not laboring.   [redacted]w[redacted]d 07/11/2019 11:42 PM  ----- 09/10/2019, CNM Certified Nurse Midwife [facility], Department of OB/GYN [facility]

## 2019-07-13 ENCOUNTER — Encounter: Payer: Self-pay | Admitting: Obstetrics and Gynecology

## 2019-07-13 ENCOUNTER — Other Ambulatory Visit: Payer: Self-pay

## 2019-07-13 ENCOUNTER — Ambulatory Visit: Payer: Self-pay

## 2019-07-13 ENCOUNTER — Inpatient Hospital Stay: Payer: Medicaid Other | Admitting: Anesthesiology

## 2019-07-13 ENCOUNTER — Inpatient Hospital Stay
Admission: EM | Admit: 2019-07-13 | Discharge: 2019-07-15 | DRG: 807 | Disposition: A | Discharge status: Home / Self Care | Payer: Medicaid Other

## 2019-07-13 DIAGNOSIS — O0990 Supervision of high risk pregnancy, unspecified, unspecified trimester: Secondary | ICD-10-CM | POA: Diagnosis not present

## 2019-07-13 DIAGNOSIS — Z3A40 40 weeks gestation of pregnancy: Secondary | ICD-10-CM | POA: Diagnosis not present

## 2019-07-13 DIAGNOSIS — O4292 Full-term premature rupture of membranes, unspecified as to length of time between rupture and onset of labor: Principal | ICD-10-CM | POA: Diagnosis present

## 2019-07-13 DIAGNOSIS — O480 Post-term pregnancy: Secondary | ICD-10-CM | POA: Diagnosis present

## 2019-07-13 DIAGNOSIS — Z20822 Contact with and (suspected) exposure to covid-19: Secondary | ICD-10-CM | POA: Diagnosis present

## 2019-07-13 LAB — COMPREHENSIVE METABOLIC PANEL
ALT: 16 U/L (ref 0–44)
AST: 20 U/L (ref 15–41)
Albumin: 3 g/dL — ABNORMAL LOW (ref 3.5–5.0)
Alkaline Phosphatase: 117 U/L (ref 38–126)
Anion gap: 9 (ref 5–15)
BUN: 9 mg/dL (ref 6–20)
CO2: 21 mmol/L — ABNORMAL LOW (ref 22–32)
Calcium: 8.9 mg/dL (ref 8.9–10.3)
Chloride: 106 mmol/L (ref 98–111)
Creatinine, Ser: 0.4 mg/dL — ABNORMAL LOW (ref 0.44–1.00)
GFR calc Af Amer: >60 mL/min (ref >60)
GFR calc non Af Amer: >60 mL/min (ref >60)
Glucose, Bld: 94 mg/dL (ref 70–99)
Potassium: 3.7 mmol/L (ref 3.5–5.1)
Sodium: 136 mmol/L (ref 135–145)
Total Bilirubin: 0.7 mg/dL (ref 0.3–1.2)
Total Protein: 6.6 g/dL (ref 6.5–8.1)

## 2019-07-13 LAB — CBC
HCT: 41.1 % (ref 36.0–46.0)
Hemoglobin: 14 g/dL (ref 12.0–15.0)
MCH: 31.4 pg (ref 26.0–34.0)
MCHC: 34.1 g/dL (ref 30.0–36.0)
MCV: 92.2 fL (ref 80.0–100.0)
Platelets: 179 10*3/uL (ref 150–400)
RBC: 4.46 MIL/uL (ref 3.87–5.11)
RDW: 13.8 % (ref 11.5–15.5)
WBC: 10.3 10*3/uL (ref 4.0–10.5)
nRBC: 0 % (ref 0.0–0.2)

## 2019-07-13 LAB — SARS CORONAVIRUS 2 BY RT PCR (HOSPITAL ORDER, PERFORMED IN ~~LOC~~ HOSPITAL LAB): SARS Coronavirus 2: NEGATIVE

## 2019-07-13 LAB — TYPE AND SCREEN
ABO/RH(D): B POS
Antibody Screen: NEGATIVE

## 2019-07-13 LAB — PROTEIN / CREATININE RATIO, URINE
Creatinine, Urine: 94 mg/dL
Protein Creatinine Ratio: 0.19 mg/mg{Cre} — ABNORMAL HIGH (ref 0.00–0.15)
Total Protein, Urine: 18 mg/dL

## 2019-07-13 MED ORDER — IBUPROFEN 600 MG PO TABS
600.0000 mg | ORAL_TABLET | Freq: Four times a day (QID) | ORAL | Status: DC
  Administered 2019-07-13 – 2019-07-15 (×7): 600 mg via ORAL
  Filled 2019-07-13 (×7): qty 1

## 2019-07-13 MED ORDER — PRENATAL MULTIVITAMIN CH
1.0000 | ORAL_TABLET | Freq: Every day | ORAL | Status: DC
  Administered 2019-07-14: 1 via ORAL
  Filled 2019-07-13: qty 1

## 2019-07-13 MED ORDER — WITCH HAZEL-GLYCERIN EX PADS
1.0000 "application " | MEDICATED_PAD | CUTANEOUS | Status: DC | PRN
  Administered 2019-07-14: 1 via TOPICAL
  Filled 2019-07-13: qty 100

## 2019-07-13 MED ORDER — DIBUCAINE (PERIANAL) 1 % EX OINT
1.0000 "application " | TOPICAL_OINTMENT | CUTANEOUS | Status: DC | PRN
  Administered 2019-07-14: 1 via RECTAL
  Filled 2019-07-13: qty 28

## 2019-07-13 MED ORDER — FENTANYL 2.5 MCG/ML W/ROPIVACAINE 0.15% IN NS 100 ML EPIDURAL (ARMC)
12.0000 mL/h | EPIDURAL | Status: DC
  Administered 2019-07-13: 12 mL/h via EPIDURAL

## 2019-07-13 MED ORDER — PHENYLEPHRINE 40 MCG/ML (10ML) SYRINGE FOR IV PUSH (FOR BLOOD PRESSURE SUPPORT): 80.0000 ug | PREFILLED_SYRINGE | INTRAVENOUS | Status: DC | PRN

## 2019-07-13 MED ORDER — OXYTOCIN 40 UNITS IN NORMAL SALINE INFUSION - SIMPLE MED
1.0000 m[IU]/min | INTRAVENOUS | Status: DC
  Filled 2019-07-13: qty 1000

## 2019-07-13 MED ORDER — EPHEDRINE 5 MG/ML INJ: 10.0000 mg | INTRAVENOUS | Status: DC | PRN

## 2019-07-13 MED ORDER — LIDOCAINE HCL (PF) 1 % IJ SOLN
INTRAMUSCULAR | Status: DC | PRN
  Administered 2019-07-13: 3 mL
  Administered 2019-07-13: 2 mL
  Administered 2019-07-13: 3 mL

## 2019-07-13 MED ORDER — ONDANSETRON HCL 4 MG PO TABS: 4.0000 mg | ORAL_TABLET | ORAL | Status: DC | PRN

## 2019-07-13 MED ORDER — FLEET ENEMA 7-19 GM/118ML RE ENEM: 1.0000 | ENEMA | Freq: Every day | RECTAL | Status: DC | PRN

## 2019-07-13 MED ORDER — TERBUTALINE SULFATE 1 MG/ML IJ SOLN: 0.2500 mg | Freq: Once | INTRAMUSCULAR | Status: DC | PRN

## 2019-07-13 MED ORDER — DIPHENHYDRAMINE HCL 50 MG/ML IJ SOLN: 12.5000 mg | INTRAMUSCULAR | Status: DC | PRN

## 2019-07-13 MED ORDER — LIDOCAINE HCL (PF) 1 % IJ SOLN
INTRAMUSCULAR | Status: AC
  Filled 2019-07-13: qty 30

## 2019-07-13 MED ORDER — SENNOSIDES-DOCUSATE SODIUM 8.6-50 MG PO TABS
2.0000 | ORAL_TABLET | ORAL | Status: DC
  Administered 2019-07-13 – 2019-07-14 (×2): 2 via ORAL
  Filled 2019-07-13 (×2): qty 2

## 2019-07-13 MED ORDER — MEASLES, MUMPS & RUBELLA VAC IJ SOLR
0.5000 mL | Freq: Once | INTRAMUSCULAR | Status: DC
  Filled 2019-07-13: qty 0.5

## 2019-07-13 MED ORDER — DIPHENHYDRAMINE HCL 25 MG PO CAPS: 25.0000 mg | ORAL_CAPSULE | Freq: Four times a day (QID) | ORAL | Status: DC | PRN

## 2019-07-13 MED ORDER — BUPIVACAINE HCL (PF) 0.25 % IJ SOLN
INTRAMUSCULAR | Status: DC | PRN
  Administered 2019-07-13: 3 mL via EPIDURAL
  Administered 2019-07-13: 4 mL via EPIDURAL

## 2019-07-13 MED ORDER — LIDOCAINE HCL (PF) 1 % IJ SOLN: 30.0000 mL | INTRAMUSCULAR | Status: DC | PRN

## 2019-07-13 MED ORDER — ACETAMINOPHEN 325 MG PO TABS: 650.0000 mg | ORAL_TABLET | ORAL | Status: DC | PRN

## 2019-07-13 MED ORDER — LACTATED RINGERS IV SOLN: 500.0000 mL | Freq: Once | INTRAVENOUS | Status: AC

## 2019-07-13 MED ORDER — BISACODYL 10 MG RE SUPP
10.0000 mg | Freq: Every day | RECTAL | Status: DC | PRN
  Filled 2019-07-13: qty 1

## 2019-07-13 MED ORDER — SODIUM CHLORIDE 0.9% FLUSH: 3.0000 mL | INTRAVENOUS | Status: DC | PRN

## 2019-07-13 MED ORDER — TETANUS-DIPHTH-ACELL PERTUSSIS 5-2.5-18.5 LF-MCG/0.5 IM SUSP
0.5000 mL | Freq: Once | INTRAMUSCULAR | Status: DC
  Filled 2019-07-13: qty 0.5

## 2019-07-13 MED ORDER — LIDOCAINE-EPINEPHRINE (PF) 1.5 %-1:200000 IJ SOLN
INTRAMUSCULAR | Status: DC | PRN
  Administered 2019-07-13: 3 mL via PERINEURAL

## 2019-07-13 MED ORDER — AMMONIA AROMATIC IN INHA
RESPIRATORY_TRACT | Status: AC
  Filled 2019-07-13: qty 10

## 2019-07-13 MED ORDER — OXYTOCIN 40 UNITS IN NORMAL SALINE INFUSION - SIMPLE MED: 2.5000 [IU]/h | INTRAVENOUS | Status: DC

## 2019-07-13 MED ORDER — FENTANYL CITRATE (PF) 100 MCG/2ML IJ SOLN: 50.0000 ug | INTRAMUSCULAR | Status: DC | PRN

## 2019-07-13 MED ORDER — METHYLERGONOVINE MALEATE 0.2 MG/ML IJ SOLN
0.2000 mg | Freq: Once | INTRAMUSCULAR | Status: AC | PRN
  Administered 2019-07-13: 0.2 mg via INTRAMUSCULAR
  Filled 2019-07-13: qty 1

## 2019-07-13 MED ORDER — OXYTOCIN BOLUS FROM INFUSION
500.0000 mL | Freq: Once | INTRAVENOUS | Status: AC
  Administered 2019-07-13: 500 mL via INTRAVENOUS

## 2019-07-13 MED ORDER — SOD CITRATE-CITRIC ACID 500-334 MG/5ML PO SOLN: 30.0000 mL | ORAL | Status: DC | PRN

## 2019-07-13 MED ORDER — OXYCODONE HCL 5 MG PO TABS
5.0000 mg | ORAL_TABLET | ORAL | Status: DC | PRN
  Administered 2019-07-13: 22:00:00 5 mg via ORAL
  Filled 2019-07-13: qty 1

## 2019-07-13 MED ORDER — FENTANYL 2.5 MCG/ML W/ROPIVACAINE 0.15% IN NS 100 ML EPIDURAL (ARMC)
EPIDURAL | Status: AC
  Filled 2019-07-13: qty 100

## 2019-07-13 MED ORDER — COCONUT OIL OIL: 1.0000 "application " | TOPICAL_OIL | Status: DC | PRN

## 2019-07-13 MED ORDER — SIMETHICONE 80 MG PO CHEW: 80.0000 mg | CHEWABLE_TABLET | ORAL | Status: DC | PRN

## 2019-07-13 MED ORDER — OXYTOCIN 10 UNIT/ML IJ SOLN
INTRAMUSCULAR | Status: AC
  Filled 2019-07-13: qty 2

## 2019-07-13 MED ORDER — ONDANSETRON HCL 4 MG/2ML IJ SOLN: 4.0000 mg | Freq: Four times a day (QID) | INTRAMUSCULAR | Status: DC | PRN

## 2019-07-13 MED ORDER — ONDANSETRON HCL 4 MG/2ML IJ SOLN: 4.0000 mg | INTRAMUSCULAR | Status: DC | PRN

## 2019-07-13 MED ORDER — ACETAMINOPHEN 325 MG PO TABS
650.0000 mg | ORAL_TABLET | ORAL | Status: DC | PRN
  Administered 2019-07-13 – 2019-07-15 (×5): 650 mg via ORAL
  Filled 2019-07-13 (×5): qty 2

## 2019-07-13 MED ORDER — BENZOCAINE-MENTHOL 20-0.5 % EX AERO
1.0000 "application " | INHALATION_SPRAY | CUTANEOUS | Status: DC | PRN
  Administered 2019-07-14: 1 via TOPICAL
  Filled 2019-07-13: qty 56

## 2019-07-13 MED ORDER — MISOPROSTOL 200 MCG PO TABS
ORAL_TABLET | ORAL | Status: AC
  Filled 2019-07-13: qty 4

## 2019-07-13 MED ORDER — SODIUM CHLORIDE 0.9% FLUSH: 3.0000 mL | Freq: Two times a day (BID) | INTRAVENOUS | Status: DC

## 2019-07-13 MED ORDER — LACTATED RINGERS IV SOLN
500.0000 mL | INTRAVENOUS | Status: DC | PRN
  Administered 2019-07-13 (×2): 500 mL via INTRAVENOUS

## 2019-07-13 MED ORDER — SODIUM CHLORIDE 0.9 % IV SOLN: 250.0000 mL | INTRAVENOUS | Status: DC | PRN

## 2019-07-13 MED ORDER — LACTATED RINGERS IV SOLN: INTRAVENOUS | Status: DC

## 2019-07-13 MED ORDER — MISOPROSTOL 50MCG HALF TABLET
50.0000 ug | ORAL_TABLET | Freq: Once | ORAL | Status: AC
  Administered 2019-07-13: 50 ug via ORAL
  Filled 2019-07-13: qty 1

## 2019-07-13 MED ORDER — ZOLPIDEM TARTRATE 5 MG PO TABS: 5.0000 mg | ORAL_TABLET | Freq: Every evening | ORAL | Status: DC | PRN

## 2019-07-13 NOTE — Progress Notes (Signed)
Labor Progress Note  Colleen Chapman is a 24 y.o. [phone] at [redacted]w[redacted]d by ultrasound admitted for rupture of membranes  Subjective: comfortable with epidural  Objective: BP 127/68 (BP Location: Left Arm)   Pulse 90   Temp 98.4 F (36.9 C) (Oral)   Resp 16   Ht 5\' 6"  (1.676 m)   Wt 98.9 kg   LMP 10/03/2018 (Within Days)   SpO2 99%   BMI 35.19 kg/m  Notable VS details: reviewed   Fetal Assessment: FHT:  FHR: 135 bpm, variability: moderate,  accelerations:  Present,  decelerations:  Present prolong decel with pushing, recurrent variables, intermittent lates and earlies Category/reactivity:  Category II UC:   regular, every 2 minutes SVE:   10/100/+1 Membrane status: SROM at 0830 Amniotic color: clear  Labs: Lab Results  Component Value Date   WBC 10.3 07/13/2019   HGB 14.0 07/13/2019   HCT 41.1 07/13/2019   MCV 92.2 07/13/2019   PLT 179 07/13/2019    Assessment / Plan: Spontaneous labor, progressing normally  Labor: Progressed well to C/C/+1.  Attempted 3 pushes with contractions and FHR responded with prolong decel.  FSE applied, eventual return to baseline but intermittent lates and earlies.  Dr. 09/12/2019 notified of reponse to pushing.  Preeclampsia:  No s/s Fetal Wellbeing:  Category II Pain Control:  Epidural I/D:  n/a   Dalbert Garnet, CNM 07/13/2019, 4:43 PM

## 2019-07-13 NOTE — Progress Notes (Signed)
Kiwi Vacuum use during delivery: 1713:Vacuum applied by MD, 1 pull 1715:Vacuum removed by MD 1718:Vacuum applied by MD, 1 pull 1719:Delivery of Baby

## 2019-07-13 NOTE — Anesthesia Procedure Notes (Signed)
Epidural Patient location during procedure: OB Start time: 07/13/2019 11:20 AM End time: 07/13/2019 12:03 PM  Staffing Anesthesiologist: Corinda Gubler, MD Resident/CRNA: Lynden Oxford, CRNA Other anesthesia staff: Karoline Caldwell, CRNA Performed: other anesthesia staff   Preanesthetic Checklist Completed: patient identified, IV checked, site marked, risks and benefits discussed, monitors and equipment checked and pre-op evaluation  Epidural Patient position: sitting Prep: ChloraPrep Patient monitoring: heart rate, cardiac monitor, continuous pulse ox and blood pressure Approach: midline Location: L3-L4 Injection technique: LOR saline  Needle:  Needle type: Tuohy  Needle gauge: 17 G Needle length: 9 cm Needle insertion depth: 8 cm Catheter type: closed end flexible Catheter size: 19 Gauge Catheter at skin depth: 13 cm Test dose: 1.5% lidocaine with Epi 1:200 K

## 2019-07-13 NOTE — Anesthesia Preprocedure Evaluation (Signed)
Anesthesia Evaluation  Patient identified by MRN, date of birth, ID band Patient awake    Reviewed: Allergy & Precautions, H&P , NPO status , Patient's Chart, lab work & pertinent test results  Airway Mallampati: II       Dental no notable dental hx.    Pulmonary    Pulmonary exam normal        Cardiovascular hypertension, Normal cardiovascular exam     Neuro/Psych PSYCHIATRIC DISORDERS Depression negative neurological ROS     GI/Hepatic Neg liver ROS, GERD  ,  Endo/Other  negative endocrine ROS  Renal/GU negative Renal ROS  negative genitourinary   Musculoskeletal   Abdominal   Peds  Hematology negative hematology ROS (+)   Anesthesia Other Findings   Reproductive/Obstetrics (+) Pregnancy                             Anesthesia Physical Anesthesia Plan  ASA: II  Anesthesia Plan: Epidural   Post-op Pain Management:    Induction:   PONV Risk Score and Plan:   Airway Management Planned:   Additional Equipment:   Intra-op Plan:   Post-operative Plan:   Informed Consent:   Plan Discussed with: Anesthesiologist and CRNA  Anesthesia Plan Comments:         Anesthesia Quick Evaluation

## 2019-07-13 NOTE — Discharge Summary (Signed)
Obstetrical Discharge Summary  Patient Name: Colleen Chapman DOB: 12/25/1995 MRN: [id number]  Date of Admission: 07/13/2019 Date of Delivery: 07/13/2019 Delivered by: Dr. Christeen Douglas  Date of Discharge: 07/15/2019  Primary OB: [facility] SEG:BTDVVOH'Y last menstrual period was 10/03/2018 (within days). EDC Estimated Date of Delivery: 07/11/19 Gestational Age at Delivery: [redacted]w[redacted]d   Antepartum complications:  1. History of depression/postpartum depression/ADHD 2. History of preeclampsia 3. 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 Post-term pregnancy, 40-42 weeks of gestation 07/13/2019  . 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 Encounter for antenatal screening for chromosomal anomalies   . History of postpartum depression 2016 12/30/2018  . Hx of preeclampsia, prior pregnancy 2017 12/30/2018  . ADHD 12/30/2018  . Family history of deafness in mom 12/30/2018  . Supervision of high risk pregnancy, antepartum 12/30/2018  . Depression 04/17/2018    Augmentation: Cytotec Complications: NRFHR during 2nd stage of labor  Intrapartum complications/course: Presented to L&D with PROM and irregular contractions.  1 dose of oral cytotec was given labor progressed well to c/c/+1.  Prolong decel with pushing.  Colleen Chapman was repositioned and pushing stopped.  FHR had slow recovery and increase in baseline with recurrent lates and earlies.  Dr. Dalbert Garnet to bedside to evaluate FHR. Risk/benefits of vacuum assisted birth were reviewed.  Attempted to push again, excellent maternal effort with prolong decel.  Vacuum applied by Dr. Dalbert Garnet d/t NRFHR.  Vacuum-assisted vaginal birth of viable baby boy in OP position.   Delivery Type: Vacuum assisted vaginal birth  Anesthesia: epidural Placenta: spontaneous Laceration: Intact  Episiotomy: none Newborn Data: Live born  female "Messiah" Birth Weight:  3680g 8lb1.8oz  APGAR: 8, 9  Newborn Delivery   Birth date/time: 07/13/2019 17:19:00 Delivery type: Vaginal, Spontaneous      Postpartum Procedures: none  Edinburgh:  Edinburgh Postnatal Depression Scale Screening Tool 07/14/2019 07/13/2019  I have been able to laugh and see the funny side of things. 0 (No Data)  I have looked forward with enjoyment to things. 0 -  I have blamed myself unnecessarily when things went wrong. 0 -  I have been anxious or worried for no good reason. 0 -  I have felt scared or panicky for no good reason. 0 -  Things have been getting on top of me. 0 -  I have been so unhappy that I have had difficulty sleeping. 0 -  I have felt sad or miserable. 0 -  I have been so unhappy that I have been crying. 0 -  The thought of harming myself has occurred to me. 0 -  Edinburgh Postnatal Depression Scale Total 0 -     Post partum course:  Patient had an uncomplicated postpartum course.  By time of discharge on PPD#2, her pain was controlled on oral pain medications; she had appropriate lochia and was ambulating, voiding without difficulty and tolerating regular diet.  She was deemed stable for discharge to home.    Discharge Physical Exam:  BP 139/66 (BP Location: Right Arm)   Pulse 78   Temp 98 F (36.7 C) (Oral)   Resp 18   Ht 5\' 6"  (1.676 m)   Wt 98.9 kg   LMP 10/03/2018 (Within Days)   SpO2 99%   BMI 35.19 kg/m   General: NAD CV: RRR Pulm: CTABL, nl effort ABD: s/nd/nt, fundus firm and below the  umbilicus Lochia: moderate DVT Evaluation: LE non-ttp, no evidence of DVT on exam.  Hemoglobin  Date Value Ref Range Status  07/14/2019 12.7 12.0 - 15.0 g/dL Final  05/31/2019 14.3 11.1 - 15.9 g/dL Final   HCT  Date Value Ref Range Status  07/14/2019 38.4 36.0 - 46.0 % Final   Hematocrit  Date Value Ref Range Status  05/31/2019 42.9 34.0 - 46.6 % Final     Disposition: stable, discharge to home Baby Feeding:  formula Baby Disposition: home with mom  Rh Immune globulin given: Rh pos Rubella vaccine given: n/a Flu vaccine given in AP or PP setting: given 12/30/2019 Tdap vaccine given in AP or PP setting: given 04/14/2019  Contraception: Depo-Provera  Prenatal Labs:  Blood type/Rh Bpos  Antibody screen neg  Rubella immune  Varicella immune   RPR Reactive, 1:1, treponemal ab pending  HBsAg Neg  HIV NR  GC neg  Chlamydia neg  Genetic screening negative  1 hour GTT 87  3 hour GTT N/A  GBS Neg    Plan:  Colleen Chapman was discharged to home in good condition. Follow-up appointment with delivering provider in 6 weeks.  Discharge Medications: Allergies as of 07/15/2019      Reactions   Bactrim [sulfamethoxazole-trimethoprim] Rash      Medication List    TAKE these medications   acetaminophen 500 MG tablet Commonly known as: TYLENOL Take 2 tablets (1,000 mg total) by mouth every 6 (six) hours as needed for mild pain or moderate pain. What changed:   medication strength  how much to take  when to take this  reasons to take this   ibuprofen 600 MG tablet Commonly known as: ADVIL Take 1 tablet (600 mg total) by mouth every 6 (six) hours as needed for mild pain, moderate pain or cramping.   Prenatal Vitamin 27-0.8 MG Tabs Take 1 tablet by mouth daily at 6 (six) AM.       Follow-up Information    Benjaman Kindler, MD Follow up in 6 week(s).   Specialty: Obstetrics and Gynecology Why: postpartum exam  Contact information: [place] [id number] [phone]        [facility] Follow up in 2 week(s).   Why: for mood check  Contact information: [place] [phone]          Signed: Lisette Grinder, CNM 07/15/2019 8:16 AM

## 2019-07-13 NOTE — H&P (Signed)
OB History & Physical   History of Present Illness:  Chief Complaint:   HPI:  Colleen Chapman is a 24 y.o. [id number] female at [redacted]w[redacted]d dated by [place] at [redacted]w[redacted]d.  She presents to L&D for PROM.  Active FM Denies contractions  SROM @ 0830 - large amount of clear fluid   Pregnancy Issues: 1. History of depression/postpartum depression/ADHD 2. History of preeclampsia  3. 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 Post-term pregnancy, 40-42 weeks of gestation 07/13/2019  . 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 Encounter for antenatal screening for chromosomal anomalies   . History of postpartum depression 2016 12/30/2018  . Hx of preeclampsia, prior pregnancy 2017 12/30/2018  . ADHD 12/30/2018  . Family history of deafness in mom 12/30/2018  . Supervision of high risk pregnancy, antepartum 12/30/2018  . Depression 04/17/2018    Maternal Medical History:   Past Medical History:  Diagnosis Date  . ADHD (attention deficit hyperactivity disorder)    ongoing  . Depression    "Because I was forced to have an abortion"  . History of urinary tract infection   . On Depo-Provera for contraception   . Pregnancy induced hypertension    Preeclampsia with 2017 delivery    Past Surgical History:  Procedure Laterality Date  . EAB  01/2018  . NO PAST SURGERIES      Allergies  Allergen Reactions  . Bactrim [Sulfamethoxazole-Trimethoprim] Rash    Prior to Admission medications   Medication Sig Start Date End Date Taking? Authorizing Provider  acetaminophen (TYLENOL) 325 MG tablet Take 2 tablets (650 mg total) by mouth every 4 (four) hours as needed (for pain scale < 4  OR  temperature  >/=  100.5 F). 05/09/19  Yes McVey, Prudencio Pair, CNM  Prenatal Vit-Fe Fumarate-FA (PRENATAL VITAMIN) 27-0.8 MG TABS Take 1 tablet by mouth daily at 6 (six) AM. 11/18/18  Yes Federico Flake, MD     Prenatal care site:   [facility]   Social History: She  reports that she has never smoked. She has never used smokeless tobacco. She reports current alcohol use. She reports that she does not use drugs.  Family History: family history includes Anemia in her mother; Asthma in her father; Bipolar disorder in her mother; Birth defects in her mother; Cancer in her paternal grandmother; Deafness in her mother; Diabetes in her father; Hypertension in her father.   Review of Systems: A full review of systems was performed and negative except as noted in the HPI.     Physical Exam:  Vital Signs: BP (!) 141/78 (BP Location: Left Arm)   Pulse 81   Temp 97.8 F (36.6 C) (Oral)   Resp 18   Ht 5\' 6"  (1.676 m)   Wt 98.9 kg   LMP 10/03/2018 (Within Days)   SpO2 100%   BMI 35.19 kg/m   General: no acute distress.  HEENT: normocephalic, atraumatic Heart: regular rate & rhythm.  No murmurs/rubs/gallops Lungs: clear to auscultation bilaterally, normal respiratory effort Abdomen: soft, gravid, non-tender;  EFW: 8lbs Pelvic:   External: Normal external female genitalia  Cervix: 2-3/Thick/-3/Posterior    Extremities: non-tender, symmetric, mild dependent edema bilaterally.  DTRs: 2+/2+  Neurologic: Alert & oriented x 3.    Results for orders placed or performed during the hospital encounter of 07/13/19 (from the past 24 hour(s))  SARS Coronavirus 2 by RT  PCR (hospital order, performed in [facility] hospital lab) Nasopharyngeal Nasopharyngeal Swab     Status: None   Collection Time: 07/13/19  9:22 AM   Specimen: Nasopharyngeal Swab  Result Value Ref Range   SARS Coronavirus 2 NEGATIVE NEGATIVE  CBC     Status: None   Collection Time: 07/13/19  9:35 AM  Result Value Ref Range   WBC 10.3 4.0 - 10.5 K/uL   RBC 4.46 3.87 - 5.11 MIL/uL   Hemoglobin 14.0 12.0 - 15.0 g/dL   HCT 41.1 36.0 - 46.0 %   MCV 92.2 80.0 - 100.0 fL   MCH 31.4 26.0 - 34.0 pg   MCHC 34.1 30.0 - 36.0 g/dL   RDW 13.8 11.5 -  15.5 %   Platelets 179 150 - 400 K/uL   nRBC 0.0 0.0 - 0.2 %  Comprehensive metabolic panel     Status: Abnormal   Collection Time: 07/13/19  9:35 AM  Result Value Ref Range   Sodium 136 135 - 145 mmol/L   Potassium 3.7 3.5 - 5.1 mmol/L   Chloride 106 98 - 111 mmol/L   CO2 21 (L) 22 - 32 mmol/L   Glucose, Bld 94 70 - 99 mg/dL   BUN 9 6 - 20 mg/dL   Creatinine, Ser 0.40 (L) 0.44 - 1.00 mg/dL   Calcium 8.9 8.9 - 10.3 mg/dL   Total Protein 6.6 6.5 - 8.1 g/dL   Albumin 3.0 (L) 3.5 - 5.0 g/dL   AST 20 15 - 41 U/L   ALT 16 0 - 44 U/L   Alkaline Phosphatase 117 38 - 126 U/L   Total Bilirubin 0.7 0.3 - 1.2 mg/dL   GFR calc non Af Amer >60 >60 mL/min   GFR calc Af Amer >60 >60 mL/min   Anion gap 9 5 - 15  Protein / creatinine ratio, urine     Status: Abnormal   Collection Time: 07/13/19  9:35 AM  Result Value Ref Range   Creatinine, Urine 94 mg/dL   Total Protein, Urine 18 mg/dL   Protein Creatinine Ratio 0.19 (H) 0.00 - 0.15 mg/mg[Cre]  Type and screen     Status: None   Collection Time: 07/13/19  9:35 AM  Result Value Ref Range   ABO/RH(D) B POS    Antibody Screen NEG    Sample Expiration      07/16/2019,2359 Performed at [facility], [address], [place], [place] [address]     Pertinent Results:  Prenatal Labs: Blood type/Rh Bpos  Antibody screen neg  Rubella Pending  Varicella Pending   RPR NR  HBsAg Neg  HIV NR  GC neg  Chlamydia neg  Genetic screening negative  1 hour GTT 87  3 hour GTT N/A  GBS Neg   FHT: 130bpm, moderate variability, accels present, decels absent TOCO: Irregular mild contractions  SVE: 2-5/KYHCW/-2/BJSEGBTDV    Cephalic by leopolds and SVE  DG Chest 2 View  Result Date: 06/16/2019 CLINICAL DATA:  Chest pain. EXAM: CHEST - 2 VIEW COMPARISON:  No pertinent prior studies available for comparison. FINDINGS: Heart size within normal limits. There is no evidence of airspace consolidation within the lungs. No evidence of pleural  effusion or pneumothorax. No acute bony abnormality. IMPRESSION: No evidence of acute cardiopulmonary abnormality. Electronically Signed   By: Kellie Simmering DO   On: 06/16/2019 14:43   NM Pulmonary Perfusion  Result Date: 06/16/2019 CLINICAL DATA:  Shortness of breath, chest pain, [redacted] weeks pregnant, question pulmonary  embolism EXAM: NUCLEAR MEDICINE PERFUSION LUNG SCAN TECHNIQUE: Perfusion images were obtained in multiple projections after intravenous injection of radiopharmaceutical. Ventilation scans intentionally deferred if perfusion scan and chest x-ray adequate for interpretation during COVID 19 epidemic. RADIOPHARMACEUTICALS:  1.54 mCi Tc-62m MAA IV COMPARISON:  None Correlation: Chest radiograph 06/16/2019 FINDINGS: Normal perfusion lung scan. IMPRESSION: Normal perfusion lung scan. Electronically Signed   By: Ulyses Southward M.D.   On: 06/16/2019 17:20   US Venous Img Lower Bilateral  Result Date: 06/16/2019 CLINICAL DATA:  Swelling, bilateral. [redacted] weeks pregnant. Chest pain. EXAM: BILATERAL LOWER EXTREMITY VENOUS DOPPLER ULTRASOUND TECHNIQUE: Gray-scale sonography with compression, as well as color and duplex ultrasound, were performed to evaluate the deep venous system(s) from the level of the common femoral vein through the popliteal and proximal calf veins. COMPARISON:  None. FINDINGS: VENOUS Normal compressibility of the common femoral, superficial femoral, and popliteal veins, as well as the visualized calf veins. Visualized portions of profunda femoral vein and great saphenous vein unremarkable. No filling defects to suggest DVT on grayscale or color Doppler imaging. Doppler waveforms show normal direction of venous flow, normal respiratory phasicity and response to augmentation. OTHER None. Limitations: none IMPRESSION: No femoropopliteal DVT nor evidence of DVT within the visualized calf veins. If clinical symptoms are inconsistent or if there are persistent or worsening symptoms, further imaging  (possibly involving the iliac veins) may be warranted. Electronically Signed   By: Corlis Leak M.D.   On: 06/16/2019 16:33    Assessment:  Colleen Chapman is a 24 y.o. [id number] female at [redacted]w[redacted]d with PROM.   Plan:  1. Admit to Labor & Delivery; consents reviewed and obtained - Covid admission screening per protocol  - Dr. Dalbert Garnet updated on admission and plan of care   2. Fetal Well being  - Fetal Tracing: cat 1 - Group B Streptococcus ppx indicated: GBS neg, not indicated  - Presentation: cephalic confirmed by Leopold's and SVE   3. Routine OB: - Prenatal labs reviewed, as above - Rh pos - CBC, T&S, RPR on admit - Clear fluids, IVF - CMP and urine PCR d/t history of preeclampsia   4. Augmentation of Labor -  External toco in place -  Pelvis proven to 8lbs -  Plan for induction with 1 dose of oral misoprostol then oxytocin if needed  -  Plan for continuous fetal monitoring  -  Maternal pain control as desired; regional anesthesia per maternal request  - Anticipate vaginal delivery  5. Post Partum Planning: - Infant feeding: formula - Contraception: TBD   Margaretmary Eddy, CNM Certified Nurse Midwife [facility]

## 2019-07-14 LAB — VARICELLA ZOSTER ANTIBODY, IGG: Varicella IgG: 537 index (ref >165)

## 2019-07-14 LAB — CBC
HCT: 38.4 % (ref 36.0–46.0)
Hemoglobin: 12.7 g/dL (ref 12.0–15.0)
MCH: 31.4 pg (ref 26.0–34.0)
MCHC: 33.1 g/dL (ref 30.0–36.0)
MCV: 94.8 fL (ref 80.0–100.0)
Platelets: 165 10*3/uL (ref 150–400)
RBC: 4.05 MIL/uL (ref 3.87–5.11)
RDW: 13.7 % (ref 11.5–15.5)
WBC: 12.6 10*3/uL — ABNORMAL HIGH (ref 4.0–10.5)
nRBC: 0 % (ref 0.0–0.2)

## 2019-07-14 LAB — RPR
RPR Ser Ql: REACTIVE — AB
RPR Titer: 1:1 {titer}

## 2019-07-14 LAB — RUBELLA SCREEN: Rubella: 8.56 index (ref >0.99)

## 2019-07-14 MED ORDER — MEDROXYPROGESTERONE ACETATE 150 MG/ML IM SUSP
150.0000 mg | Freq: Once | INTRAMUSCULAR | Status: AC
  Administered 2019-07-15: 150 mg via INTRAMUSCULAR
  Filled 2019-07-14: qty 1

## 2019-07-14 NOTE — Progress Notes (Signed)
Post Partum Day 1  Subjective: Doing well, no concerns. Ambulating without difficulty, pain managed with PO meds, tolerating regular diet, and voiding without difficulty.   No fever/chills, chest pain, shortness of breath, nausea/vomiting, or leg pain. No nipple or breast pain.   Objective: BP 119/62 (BP Location: Right Arm)   Pulse 79   Temp (!) 97.5 F (36.4 C) (Oral)   Resp 20   Ht 5\' 6"  (1.676 m)   Wt 98.9 kg   LMP 10/03/2018 (Within Days)   SpO2 100%   BMI 35.19 kg/m    Physical Exam:  General: alert, cooperative, appears stated age and no distress Breasts: soft/nontender CV: RRR Pulm: nl effort, CTABL Abdomen: soft, non-tender, active bowel sounds Uterine Fundus: firm Lochia: appropriate DVT Evaluation: No evidence of DVT seen on physical exam.  No cords or calf tenderness. No significant calf/ankle edema.  Recent Labs    07/13/19 0935 07/14/19 0620  HGB 14.0 12.7  HCT 41.1 38.4  WBC 10.3 12.6*  PLT 179 165    Assessment/Plan: 24 y.o. 09/13/19 postpartum day # 1  -Continue routine postpartum care -Encouraged snug fitting bra, cold application, Tylenol PRN, and cabbage leaves for engorgement for formula feeding  -Discussed contraceptive options including implant, IUDs hormonal and non-hormonal, injection, pills/ring/patch, condoms, and NFP.  -Immunization status: all immunizations up to date -RPR reactive, titer 1:1, likely false positive. Confirmatory testing is pending.   Disposition: Continue inpatient postpartum care   LOS: 1 day   [id number], CNM 07/14/2019, 12:55 PM   ----- 09/13/2019 Certified Nurse Midwife [facility]

## 2019-07-14 NOTE — Anesthesia Postprocedure Evaluation (Signed)
Anesthesia Post Note  Patient: Colleen Chapman  Procedure(s) Performed: AN AD HOC LABOR EPIDURAL  Patient location during evaluation: Mother Baby Anesthesia Type: Epidural Level of consciousness: awake and alert Pain management: pain level controlled Vital Signs Assessment: post-procedure vital signs reviewed and stable Respiratory status: spontaneous breathing, nonlabored ventilation and respiratory function stable Cardiovascular status: stable Postop Assessment: no headache, no backache and epidural receding Anesthetic complications: no     Last Vitals:  Vitals:   07/13/19 2328 07/14/19 0358  BP: 138/69 123/67  Pulse: 85 85  Resp: 16 18  Temp: 36.7 C 36.6 C  SpO2: 97% 98%    Last Pain:  Vitals:   07/14/19 0358  TempSrc: Oral  PainSc:                  Elmarie Mainland

## 2019-07-15 LAB — T.PALLIDUM AB, TOTAL: T Pallidum Abs: NONREACTIVE

## 2019-07-15 MED ORDER — IBUPROFEN 600 MG PO TABS: 600.0000 mg | ORAL_TABLET | Freq: Four times a day (QID) | ORAL | 0 refills | Status: DC | PRN

## 2019-07-15 MED ORDER — ACETAMINOPHEN 500 MG PO TABS: 1000.0000 mg | ORAL_TABLET | Freq: Four times a day (QID) | ORAL | 0 refills | Status: DC | PRN

## 2019-07-15 NOTE — Discharge Instructions (Signed)
Discharge Instructions:   Follow-up Appointment:  If there are any new medications, they have been ordered and will be available for pickup at the listed pharmacy on your way home from the hospital.   Call office if you have any of the following: headache, visual changes, fever >101.0 F, chills, shortness of breath, breast concerns, excessive vaginal bleeding, incision drainage or problems, leg pain or redness, depression or any other concerns. If you have vaginal discharge with an odor, let your doctor know.   It is normal to bleed for up to 6 weeks. You should not soak through more than 1 pad in 1 hour. If you have a blood clot larger than your fist with continued bleeding, call your doctor.   Activity: Do not lift > 10 lbs for 6 weeks (do not lift anything heavier than your baby). No intercourse, tampons, swimming pools, hot tubs, baths (only showers) for 6 weeks.  No driving for 1-2 weeks. Continue prenatal vitamin, especially if breastfeeding. Increase calories and fluids (water) while breastfeeding.   Your milk will come in, in the next couple of days (right now it is colostrum). You may have a slight fever when your milk comes in, but it should go away on its own.  If it does not, and rises above 101 F please call the doctor. You will also feel achy and your breasts will be firm. They will also start to leak. If you are breastfeeding, continue as you have been and you can pump/express milk for comfort.   If you have too much milk, your breasts can become engorged, which could lead to mastitis. This is an infection of the milk ducts. It can be very painful and you will need to notify your doctor to obtain a prescription for antibiotics. You can also treat it with a shower or hot/cold compress.   For concerns about your baby, please call your pediatrician.  For breastfeeding concerns, the lactation consultant can be reached at [phone].   Postpartum blues (feelings of happy one minute  and sad another minute) are normal for the first few weeks but if it gets worse let your doctor know.   Congratulations! We enjoyed caring for you and your new bundle of joy!    Vaginal Delivery, Care After Refer to this sheet in the next few weeks. These discharge instructions provide you with information on caring for yourself after delivery. Your caregiver may also give you specific instructions. Your treatment has been planned according to the most current medical practices available, but problems sometimes occur. Call your caregiver if you have any problems or questions after you go home. HOME CARE INSTRUCTIONS 1. Take over-the-counter or prescription medicines only as directed by your caregiver or pharmacist. 2. Do not drink alcohol, especially if you are breastfeeding or taking medicine to relieve pain. 3. Do not smoke tobacco. 4. Continue to use good perineal care. Good perineal care includes: 1. Wiping your perineum from back to front 2. Keeping your perineum clean. 3. You can do sitz baths twice a day, to help keep this area clean 5. Do not use tampons, douche or have sex until your caregiver says it is okay. 6. Shower only and avoid sitting in submerged water, aside from sitz baths 7. Wear a well-fitting bra that provides breast support. 8. Eat healthy foods. 9. Drink enough fluids to keep your urine clear or pale yellow. 10. Eat high-fiber foods such as whole grain cereals and breads, brown rice, beans, and fresh fruits   and vegetables every day. These foods may help prevent or relieve constipation. 11. Avoid constipation with high fiber foods or medications, such as miralax or metamucil 12. Follow your caregiver's recommendations regarding resumption of activities such as climbing stairs, driving, lifting, exercising, or traveling. 13. Talk to your caregiver about resuming sexual activities. Resumption of sexual activities is dependent upon your risk of infection, your rate of  healing, and your comfort and desire to resume sexual activity. 14. Try to have someone help you with your household activities and your newborn for at least a few days after you leave the hospital. 15. Rest as much as possible. Try to rest or take a nap when your newborn is sleeping. 16. Increase your activities gradually. 17. Keep all of your scheduled postpartum appointments. It is very important to keep your scheduled follow-up appointments. At these appointments, your caregiver will be checking to make sure that you are healing physically and emotionally. SEEK MEDICAL CARE IF:   You are passing large clots from your vagina. Save any clots to show your caregiver.  You have a foul smelling discharge from your vagina.  You have trouble urinating.  You are urinating frequently.  You have pain when you urinate.  You have a change in your bowel movements.  You have increasing redness, pain, or swelling near your vaginal incision (episiotomy) or vaginal tear.  You have pus draining from your episiotomy or vaginal tear.  Your episiotomy or vaginal tear is separating.  You have painful, hard, or reddened breasts.  You have a severe headache.  You have blurred vision or see spots.  You feel sad or depressed.  You have thoughts of hurting yourself or your newborn.  You have questions about your care, the care of your newborn, or medicines.  You are dizzy or light-headed.  You have a rash.  You have nausea or vomiting.  You were breastfeeding and have not had a menstrual period within 12 weeks after you stopped breastfeeding.  You are not breastfeeding and have not had a menstrual period by the 12th week after delivery.  You have a fever. SEEK IMMEDIATE MEDICAL CARE IF:   You have persistent pain.  You have chest pain.  You have shortness of breath.  You faint.  You have leg pain.  You have stomach pain.  Your vaginal bleeding saturates two or more sanitary pads  in 1 hour. MAKE SURE YOU:   Understand these instructions.  Will watch your condition.  Will get help right away if you are not doing well or get worse. Document Released: 02/16/2000 Document Revised: 07/05/2013 Document Reviewed: 10/16/2011 ExitCare Patient Information 2015 [employer]. This information is not intended to replace advice given to you by your health care provider. Make sure you discuss any questions you have with your health care provider.  Sitz Bath A sitz bath is a warm water bath taken in the sitting position. The water covers only the hips and butt (buttocks). We recommend using one that fits in the toilet, to help with ease of use and cleanliness. It may be used for either healing or cleaning purposes. Sitz baths are also used to relieve pain, itching, or muscle tightening (spasms). The water may contain medicine. Moist heat will help you heal and relax.  HOME CARE  Take 3 to 4 sitz baths a day. 18. Fill the bathtub half-full with warm water. 19. Sit in the water and open the drain a little. 20. Turn on the warm water   to keep the tub half-full. Keep the water running constantly. 21. Soak in the water for 15 to 20 minutes. 22. After the sitz bath, pat the affected area dry. GET HELP RIGHT AWAY IF: You get worse instead of better. Stop the sitz baths if you get worse. MAKE SURE YOU:  Understand these instructions.  Will watch your condition.  Will get help right away if you are not doing well or get worse. Document Released: 03/28/2004 Document Revised: 11/13/2011 Document Reviewed: 06/18/2010 ExitCare Patient Information 2015 [employer]. This information is not intended to replace advice given to you by your health care provider. Make sure you discuss any questions you have with your health care provider.    

## 2019-07-15 NOTE — Lactation Note (Signed)
This note was copied from a baby's chart. Lactation Consultation Note  Patient Name: Colleen Chapman RAXEN'M Date: 07/15/2019   Mom reports bottle feedings to be going well, some early spit-up with first feedings, but improved over the course of their stay.  Baby consuming on average 67mL/feeding; normal for HOL.  Reviewed formula mixing instructions and paced bottle feeding. Mom given information for drying up her milk supply, breast care, signs of mastitis and when to seek help. Mom had no questions.  Maternal Data Formula Feeding for Exclusion: Yes Reason for exclusion: Mother's choice to formula feed on admision  Feeding    LATCH Score                   Interventions    Lactation Tools Discussed/Used WIC Program: Yes   Consult Status Consult Status: Complete    Danford Bad 07/15/2019, 8:57 AM

## 2019-07-15 NOTE — Progress Notes (Signed)
Patient discharged home with infant. Discharge instructions and prescriptions given and reviewed with patient. Patient verbalized understanding. Escorted out by auxillary.  

## 2019-07-16 ENCOUNTER — Telehealth: Payer: Self-pay

## 2019-07-16 ENCOUNTER — Ambulatory Visit: Payer: Medicaid Other

## 2019-07-16 NOTE — Telephone Encounter (Signed)
Per M. Havilland, CNM @ [facility]-called client and notified Syphillis result was false positive Sharlette Dense, RN

## 2019-07-27 ENCOUNTER — Telehealth: Payer: Self-pay | Admitting: Licensed Clinical Social Worker

## 2019-07-27 NOTE — Telephone Encounter (Signed)
-----   Message from Kathreen Cosier, [place] sent at 07/15/2019  4:39 PM EDT ----- Regarding: delivered 07/13/19 Follow up May 25th.

## 2019-07-27 NOTE — Telephone Encounter (Signed)
LCSW contacted patient for 2 week postpartum mood check. Conducted brief assessment of patient's symptoms. Patient denies feelings of depressed mood, is able to sleep when baby is sleeping but is getting limited sleep at times due to baby waking every two hours, she has low appetite but is eating, has a supportive husband, and is giving baby some breast milk.  She reports some worries about baby's wellbeing - checking to be sure baby is breathing but denies any significant anxiety symptoms at this time. Patient reports that she is familiar with postpartum depression because she had it after her 1st baby. LCSW reviewed signs symptoms of postpartum mood and anxietydisorders. Discussed self-care,  including sleeping, eating, and getting outside daily. LCSW encouraged patient to seek support from her husband if she needs extra sleep - she reported he does help with this. LCSW encouraged patient to contact LCSW and/or [facility] if she experience any symptoms or has any concerns.

## 2019-09-08 ENCOUNTER — Ambulatory Visit: Payer: Medicaid Other | Admitting: Advanced Practice Midwife

## 2019-09-08 ENCOUNTER — Encounter: Payer: Self-pay | Admitting: Advanced Practice Midwife

## 2019-09-08 ENCOUNTER — Other Ambulatory Visit: Payer: Self-pay

## 2019-09-08 VITALS — BP 115/74 | Temp 97.6°F | Ht 66.0 in | Wt 196.0 lb

## 2019-09-08 DIAGNOSIS — Z30013 Encounter for initial prescription of injectable contraceptive: Secondary | ICD-10-CM | POA: Diagnosis not present

## 2019-09-08 DIAGNOSIS — Z3009 Encounter for other general counseling and advice on contraception: Secondary | ICD-10-CM

## 2019-09-08 DIAGNOSIS — Z3042 Encounter for surveillance of injectable contraceptive: Secondary | ICD-10-CM

## 2019-09-08 DIAGNOSIS — Z8659 Personal history of other mental and behavioral disorders: Secondary | ICD-10-CM

## 2019-09-08 MED ORDER — MULTI-VITAMIN/MINERALS PO TABS
1.0000 | ORAL_TABLET | Freq: Every day | ORAL | 0 refills | Status: DC
Start: 2019-09-29 — End: 2020-08-19

## 2019-09-08 NOTE — Progress Notes (Signed)
Post Partum Exam  Colleen Chapman is a 24 y.o. [phone] female who presents for a postpartum visit. She is 8 weeks postpartum following a vacuum delivery.  PROM followed by IOL, fetal late decels due to OP position, M 8#1.  Received DMPA in hospital on 07/15/19.  Bottlefeeding q 3 hrs (6 oz) and FOB helps with kids.  Oldest child (25 yo son) is in [place] with pt's mom for the summer.  Nonsmoker.  Last ETOH before pregnancy.  PP sex x 4 with last time last night with condom. Baby weighed 10#15 last month.  I have fully reviewed the prenatal and intrapartum course. The delivery was at 40 2/7 gestational weeks.  Anesthesia: epidural. Postpartum course has been wnl. Baby's course has been wnl. Baby is feeding by Bottle Bleeding thin lochia. Bowel function is normal. Bladder function is normal. Patient is sexually active. Contraception method is Depo-Provera injections.   Postpartum depression screening:  Edinburgh Postnatal Depression Scale - 09/08/19 1128      Edinburgh Postnatal Depression Scale:  In the Past 7 Days   I have been able to laugh and see the funny side of things. 0    I have looked forward with enjoyment to things. 0    I have blamed myself unnecessarily when things went wrong. 0    I have been anxious or worried for no good reason. 0    I have felt scared or panicky for no good reason. 0    Things have been getting on top of me. 0    I have been so unhappy that I have had difficulty sleeping. 0    I have felt sad or miserable. 0    I have been so unhappy that I have been crying. 0    The thought of harming myself has occurred to me. 0    Edinburgh Postnatal Depression Scale Total 0            The following portions of the patient's history were reviewed and updated as appropriate: allergies, current medications, past family history, past medical history, past social history, past surgical history and problem list. Last pap smear done 06/29/16 and was Normal  Review of  Systems Pertinent items are noted in HPI.    Objective:  BP 115/74   Temp 97.6 F (36.4 C) (Oral)   Ht 5\' 6"  (1.676 m)   Wt 196 lb (88.9 kg)   LMP 10/03/2018 (Exact Date)   Breastfeeding No   BMI 31.64 kg/m   Gen: well appearing, NAD HEENT: no scleral icterus CV: RR Lung: Normal WOB Breast:performed-not indicated  Ext: warm well perfused  GU: vagina well healed and pink with small amt white creamy leukorrhea Uterus NSSC  Rectal: performed -  not indicated       Assessment:    8 wk postpartum exam. Pap smear done at today's visit.   Plan:   Essential components of care per ACOG recommendations for Comprehensive Postpartum exam:  1.  Mood and well being: Patient with negative depression screening today. Reviewed local resources for support. EPDS is low risk. Reviewed resources and that mood sx in first year after pregnancy are considered related to pregnancy and to reach out for help at [facility] if needed. Discussed [facility] as link to care and availability of LCSW for counseling  - Patient does not use tobacco.  - hx of drug use? No    2. Infant care and feeding:  -Patient currently breastmilk feeding? No   -  Recommended patient engage with WIC/BFpeer counselors  -Counseled to sign new child up for [facility] services -Social determinants of health (SDOH) reviewed in EPIC. No concerns  3. Sexuality, contraception and birth spacing  Contraception: Contraception counseling: Reviewed all forms of birth control options in the tiered based approach. available including abstinence; over the counter/barrier methods; hormonal contraceptive medication including pill, patch, ring, injection,contraceptive implant; hormonal and nonhormonal IUDs; permanent sterilization options including vasectomy and the various tubal sterilization modalities. Risks, benefits, and typical effectiveness rates were reviewed.  Questions were answered.  Written information was also given to the patient to review.   Patient desires DMPA, this was prescribed for patient. She will follow up in 11-13 wks for surveillance.  She was told to call with any further questions, or with any concerns about this method of contraception.  Emphasized use of condoms 100% of the time for STI prevention.  Patient was offered ECP. ECP was not accepted by the patient. ECP counseling was not given - see RN documentation  - Patient does not want a pregnancy in the next year.  Desired family size is 3 children.  - Reviewed forms of contraception in tiered fashion. Patient desired Depo-Provera today.   - Discussed birth spacing of 18 months  4. Sleep and fatigue -Encouraged family/partner/community support of 4 hrs of uninterrupted sleep to help with mood and fatigue  5. Physical Recovery  - Discussed patients delivery and complications - Patient  perineal healing reviewed. Patient expressed understanding - Patient has urinary incontinence? No  - Patient is safe to resume physical and sexual activity  6.  Health Maintenance/Chronic Disease - Last pap smear performed today    1. History of postpartum depression 2016 PHQ-9=0.  Denies sxs depression and has good support system  2. Postpartum exam Treat wet mount per standing orders Please give husband vasectomy info - WET PREP FOR TRICH, YEAST, CLUE - Hemoglobin, venipuncture - IGP, rfx Aptima HPV ASCU  3. Encounter for surveillance of injectable contraceptive Please give DMPA 150 mg IM q 11-13 wks x 1 year (not today) Last DMPA given in hospital 07/15/19   Patient given handout about PCP care in the community Given MVI per family planning program guidelines and availability  Follow up in: 11-13 wks weeks or as needed.

## 2019-09-08 NOTE — Progress Notes (Signed)
Wet mount and hgb results reviewed and no interventions per standing order. Per chart review, client received Depo 150 mg IM on 07/15/2019 at [facility]. Depo consent signed today. Jossie Ng, RN

## 2019-09-14 LAB — HEMOGLOBIN, FINGERSTICK: Hemoglobin: 14.8 g/dL (ref 11.1–15.9)

## 2019-09-14 LAB — WET PREP FOR TRICH, YEAST, CLUE
Trichomonas Exam: NEGATIVE
Yeast Exam: NEGATIVE

## 2019-09-16 LAB — IGP, RFX APTIMA HPV ASCU: PAP Smear Comment: 0

## 2019-09-16 LAB — HPV APTIMA: HPV Aptima: POSITIVE — AB

## 2019-10-08 ENCOUNTER — Ambulatory Visit (LOCAL_COMMUNITY_HEALTH_CENTER): Payer: Medicaid Other

## 2019-10-08 ENCOUNTER — Other Ambulatory Visit: Payer: Self-pay

## 2019-10-08 VITALS — BP 131/72 | Ht 66.0 in | Wt 200.5 lb

## 2019-10-08 DIAGNOSIS — Z3009 Encounter for other general counseling and advice on contraception: Secondary | ICD-10-CM

## 2019-10-08 DIAGNOSIS — Z30013 Encounter for initial prescription of injectable contraceptive: Secondary | ICD-10-CM | POA: Diagnosis not present

## 2019-10-08 DIAGNOSIS — Z3042 Encounter for surveillance of injectable contraceptive: Secondary | ICD-10-CM

## 2019-10-08 DIAGNOSIS — Z309 Encounter for contraceptive management, unspecified: Secondary | ICD-10-CM

## 2019-10-08 MED ORDER — MEDROXYPROGESTERONE ACETATE 150 MG/ML IM SUSP
150.0000 mg | Freq: Once | INTRAMUSCULAR | Status: AC
  Administered 2019-10-08: 150 mg via INTRAMUSCULAR

## 2019-10-08 NOTE — Progress Notes (Signed)
DMPA 150mg  given IM today per 09/08/2019 order by 11/09/2019, CNM and tolerated well.  Exp. 09/07/2020. 11/08/2020, RN   Patient is 12w 1d post last Depo. Hart Carwin, RN

## 2019-10-31 ENCOUNTER — Emergency Department: Payer: Medicaid Other

## 2019-10-31 ENCOUNTER — Other Ambulatory Visit: Payer: Self-pay

## 2019-10-31 ENCOUNTER — Emergency Department
Admission: EM | Admit: 2019-10-31 | Discharge: 2019-10-31 | Disposition: A | Discharge status: Home / Self Care | Payer: Medicaid Other | Attending: Emergency Medicine | Admitting: Emergency Medicine

## 2019-10-31 DIAGNOSIS — R072 Precordial pain: Secondary | ICD-10-CM | POA: Diagnosis present

## 2019-10-31 DIAGNOSIS — M940 Chondrocostal junction syndrome [Tietze]: Secondary | ICD-10-CM | POA: Diagnosis not present

## 2019-10-31 DIAGNOSIS — U071 COVID-19: Secondary | ICD-10-CM | POA: Insufficient documentation

## 2019-10-31 MED ORDER — PREDNISONE 10 MG PO TABS
10.0000 mg | ORAL_TABLET | Freq: Every day | ORAL | 0 refills | Status: DC
Start: 2019-10-31 — End: 2020-08-19

## 2019-10-31 NOTE — ED Triage Notes (Signed)
Pt comes via POV from home with c/o cough and COVID+. Pt states she was dx last Monday and doesn't feel any better.  Pt states some diarrhea and little nausea.

## 2019-10-31 NOTE — ED Provider Notes (Signed)
[facility] Emergency Department Provider Note  ____________________________________________  Time seen: Approximately 4:01 PM  I have reviewed the triage vital signs and the nursing notes.   HISTORY  Chief Complaint COVID+    HPI Colleen Chapman is a 24 y.o. female who presents the emergency department complaining of sternal pain secondary to COVID-19.  Patient states that she has had 10 or 11 days of Covid symptoms.  She states that she had received her first vaccine, for 3 to 4 days later she was exposed to her boyfriend who was positive for COVID-19 and contracted Covid.  Patient states that she has developed a reproducible pain along the anterior chest wall.  No difficulty breathing.  No high fevers or chills.  No neck pain or stiffness.  No abdominal pain, nausea vomiting, diarrhea or constipation.         Past Medical History:  Diagnosis Date  . ADHD (attention deficit hyperactivity disorder)    ongoing  . Depression    "Because I was forced to have an abortion"  . History of urinary tract infection   . On Depo-Provera for contraception   . Pregnancy induced hypertension    Preeclampsia with 2017 delivery    Patient Active Problem List   Diagnosis Date Noted  . Post-term pregnancy, 40-42 weeks of gestation 07/13/2019  . 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 Encounter for antenatal screening for chromosomal anomalies   . History of postpartum depression 2016 12/30/2018  . Hx of preeclampsia, prior pregnancy 2017 12/30/2018  . ADHD 12/30/2018  . Family history of deafness in mom 12/30/2018  . Supervision of high risk pregnancy, antepartum 12/30/2018  . Depression 04/17/2018    Past Surgical History:  Procedure Laterality Date  . EAB  01/2018  . NO PAST SURGERIES      Prior to Admission medications   Medication Sig Start Date End Date Taking? Authorizing Provider  acetaminophen (TYLENOL) 500 MG tablet Take 2  tablets (1,000 mg total) by mouth every 6 (six) hours as needed for mild pain or moderate pain. Patient not taking: Reported on 09/08/2019 07/15/19   Genia Del, CNM  ibuprofen (ADVIL) 600 MG tablet Take 1 tablet (600 mg total) by mouth every 6 (six) hours as needed for mild pain, moderate pain or cramping. Patient not taking: Reported on 09/08/2019 07/15/19   Genia Del, CNM  Multiple Vitamins-Minerals (MULTIVITAMIN WITH MINERALS) tablet Take 1 tablet by mouth daily. 09/29/19   Federico Flake, MD  predniSONE (DELTASONE) 10 MG tablet Take 1 tablet (10 mg total) by mouth daily. 10/31/19   Rakeen Gaillard, Delorise Royals, PA-C  Prenatal Vit-Fe Fumarate-FA (PRENATAL VITAMIN) 27-0.8 MG TABS Take 1 tablet by mouth daily at 6 (six) AM. 11/18/18   Federico Flake, MD    Allergies Bactrim [sulfamethoxazole-trimethoprim]  Family History  Problem Relation Age of Onset  . Bipolar disorder Mother   . Anemia Mother   . Deafness Mother   . Birth defects Mother        born without hand  . Diabetes Father   . Hypertension Father   . Asthma Father   . Cancer Paternal Grandmother        Skin Ca    Social History Social History   Tobacco Use  . Smoking status: Never Smoker  . Smokeless tobacco: Never Used  Vaping Use  . Vaping Use: Never used  Substance Use Topics  . Alcohol use: Not Currently    Comment: Last ETOH  use 10/2018 (wine cooler)  . Drug use: No     Review of Systems  Constitutional: No fever/chills Eyes: No visual changes. No discharge ENT: No upper respiratory complaints. Cardiovascular: no substernal chest pain. Anterior chest wall pain Respiratory: no cough. No SOB. Gastrointestinal: No abdominal pain.  No nausea, no vomiting.  No diarrhea.  No constipation. Musculoskeletal: Negative for musculoskeletal pain. Skin: Negative for rash, abrasions, lacerations, ecchymosis. Neurological: Negative for headaches, focal weakness or numbness. 10-point ROS otherwise  negative.  ____________________________________________   PHYSICAL EXAM:  VITAL SIGNS: ED Triage Vitals [10/31/19 1311]  Enc Vitals Group     BP (!) 151/71     Pulse Rate 80     Resp 18     Temp 98.1 F (36.7 C)     Temp src      SpO2 99 %     Weight 196 lb (88.9 kg)     Height 5\' 6"  (1.676 m)     Head Circumference      Peak Flow      Pain Score 4     Pain Loc      Pain Edu?      Excl. in GC?      Constitutional: Alert and oriented. Well appearing and in no acute distress. Eyes: Conjunctivae are normal. PERRL. EOMI. Head: Atraumatic. ENT:      Ears:       Nose: No congestion/rhinnorhea.      Mouth/Throat: Mucous membranes are moist.  Neck: No stridor.    Cardiovascular: Normal rate, regular rhythm. Normal S1 and S2.  Good peripheral circulation. Respiratory: Normal respiratory effort without tachypnea or retractions. Lungs CTAB. Good air entry to the bases with no decreased or absent breath sounds. Gastrointestinal: Bowel sounds 4 quadrants. Soft and nontender to palpation. No guarding or rigidity. No palpable masses. No distention. No CVA tenderness. Musculoskeletal: Full range of motion to all extremities. No gross deformities appreciated.  Palpation along the anterior chest wall reveals tenderness along the right sternal border.  This reproduces patient's pain.  No other tenderness appreciated. Neurologic:  Normal speech and language. No gross focal neurologic deficits are appreciated.  Skin:  Skin is warm, dry and intact. No rash noted. Psychiatric: Mood and affect are normal. Speech and behavior are normal. Patient exhibits appropriate insight and judgement.   ____________________________________________   LABS (all labs ordered are listed, but only abnormal results are displayed)  Labs Reviewed - No data to display ____________________________________________  EKG   ____________________________________________  RADIOLOGY I personally viewed and  evaluated these images as part of my medical decision making, as well as reviewing the written report by the radiologist.  DG Chest 1 View  Result Date: 10/31/2019 CLINICAL DATA:  Cough.  COVID positive. EXAM: CHEST  1 VIEW COMPARISON:  Radiograph 06/16/2019 FINDINGS: Heart is normal in size. Minor heterogeneous bilateral airspace opacities in a mid-lower lung zone predominant distribution. No pleural effusion or pneumothorax. No pulmonary edema. No evidence of pneumomediastinum. No acute osseous abnormalities are seen. IMPRESSION: Minor heterogeneous bilateral airspace opacities in a mid-lower lung zone predominant distribution, consistent with COVID-19 pneumonia. Electronically Signed   By: 06/18/2019 M.D.   On: 10/31/2019 15:18    ____________________________________________    PROCEDURES  Procedure(s) performed:    Procedures    Medications - No data to display   ____________________________________________   INITIAL IMPRESSION / ASSESSMENT AND PLAN / ED COURSE  Pertinent labs & imaging results that were available during my care of  the patient were reviewed by me and considered in my medical decision making (see chart for details).  Review of the [place] CSRS was performed in accordance of the [facility] prior to dispensing any controlled drugs.           Patient's diagnosis is consistent with COVID-19, costochondritis.  Patient presented to the emergency department with anterior chest wall pain.  She has had Covid symptoms x10 days.  She tested positive as an outpatient.  Patient continues to have some body aches and cough.  Chest x-ray is reassuring with no overlying bacterial component identified.  Pain was reproducible with palpation.  Findings are consistent with costochondritis secondary to cough from COVID-19.  Patient replaced on prednisone taper.  Return precautions discussed with the patient.  Follow-up primary care as needed. Patient is given ED precautions to return to  the ED for any worsening or new symptoms.     ____________________________________________  FINAL CLINICAL IMPRESSION(S) / ED DIAGNOSES  Final diagnoses:  COVID-19  Costochondritis      NEW MEDICATIONS STARTED DURING THIS VISIT:  ED Discharge Orders         Ordered    predniSONE (DELTASONE) 10 MG tablet  Daily       Note to Pharmacy: Take 6 pills x 2 days, 5 pills x 2 days, 4 pills x 2 days, 3 pills x 2 days, 2 pills x 2 days, and 1 pill x 2 days   10/31/19 1618              This chart was dictated using voice recognition software/Dragon. Despite best efforts to proofread, errors can occur which can change the meaning. Any change was purely unintentional.    Racheal Patches, PA-C 10/31/19 1620    Delton [place], MD 11/01/19 [phone]

## 2019-10-31 NOTE — ED Notes (Signed)
Pt had positive covid test, after taking first dose of vaccine a few days prior, was exposed through boyfriend. Pt ambulatory without difficulty.  Had  + test done at [facility].

## 2019-11-06 IMAGING — US US MFM OB COMPLETE +14 WKS
1 series · 13 of 27 positions shown · non-contrast
Comparison: none

PATIENT INFO:

PERFORMED BY:
                   Sonographer
                   YADAIAH CNM
SERVICE(S) PROVIDED:
  US MFM OB COMP LESS THAN 14 WEEKS                    76801.4
 ----------------------------------------------------------------------
INDICATIONS:
  13 weeks gestation of pregnancy
  Estimate gestational age
  First trimester anatomy
FETAL EVALUATION:
 Num Of Fetuses:         1
 Fetal Heart Rate(bpm):  147
 Cardiac Activity:       Present
 Presentation:           Variable
 Placenta:               Anterior
BIOMETRY:
 CRL:      68.9  mm     G. Age:  13w 1d                  EDD:   07/11/19
GESTATIONAL AGE:
 LMP:           13w 2d        Date:  10/03/18                 EDD:   07/10/19
 Best:          13w 1d     Det. By:  U/S C R L (01/04/19)     EDD:   07/11/19
ANATOMY:
 Choroid Plexus:        Within normal limits   Bladder:                Seen
                        for gestational age
 Stomach:               Seen                   Upper Extremities:      Visualized
 Abdominal Wall:        Within normal limits   Lower Extremities:      Visualized

[Series 1: us mfm ob complete +14 wks · 0.19mm/px · 13 of 27 slices shown]
[im 2/27]
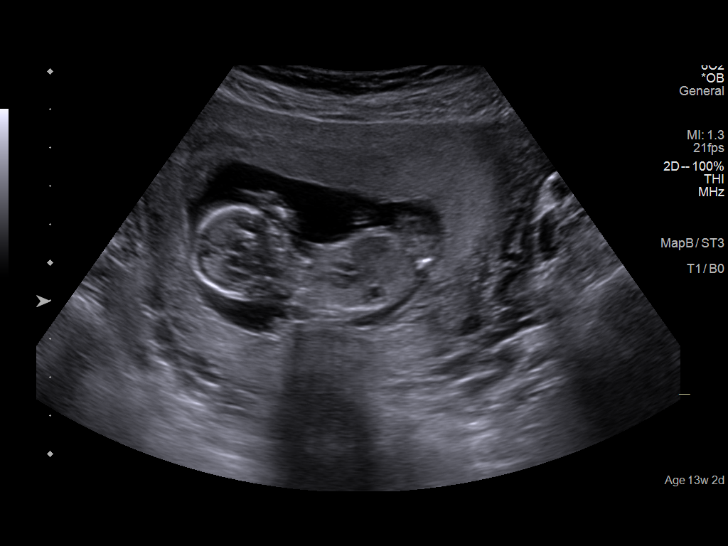
[im 4/27]
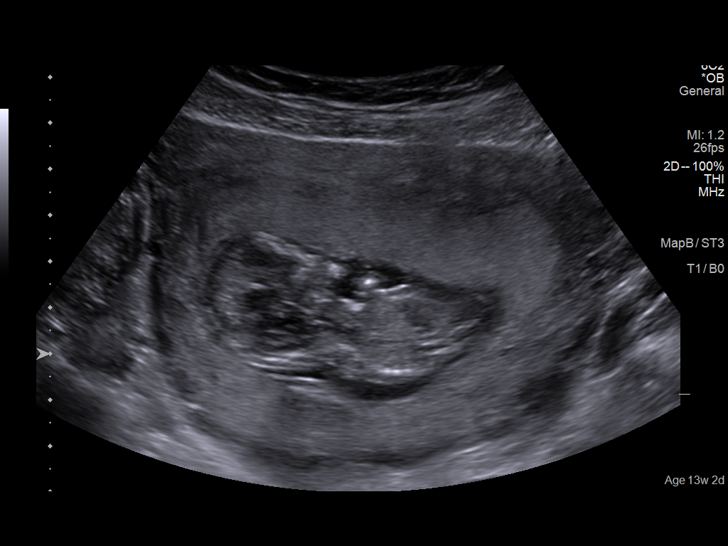
[im 6/27]
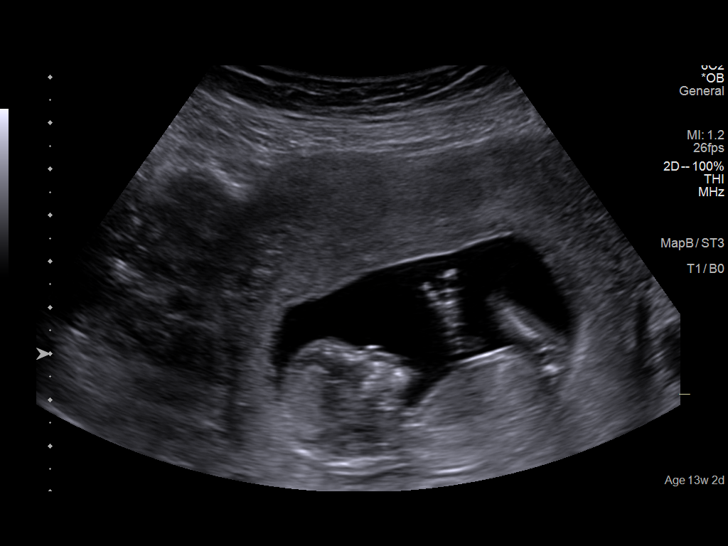
[im 8/27]
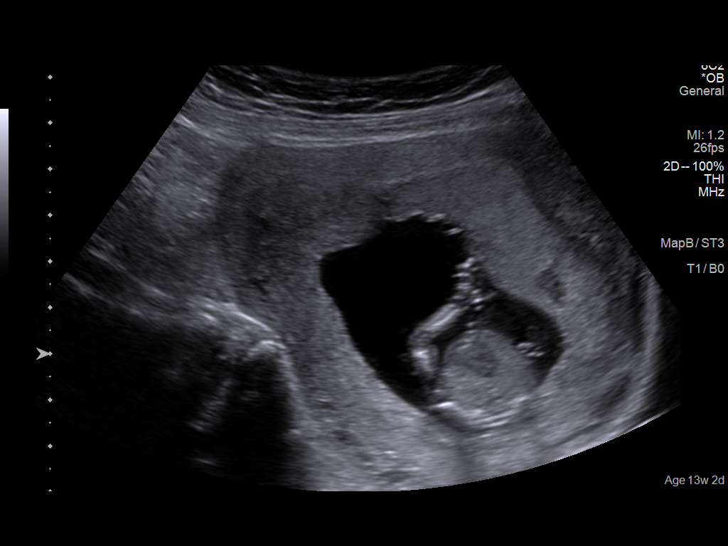
[im 10/27]
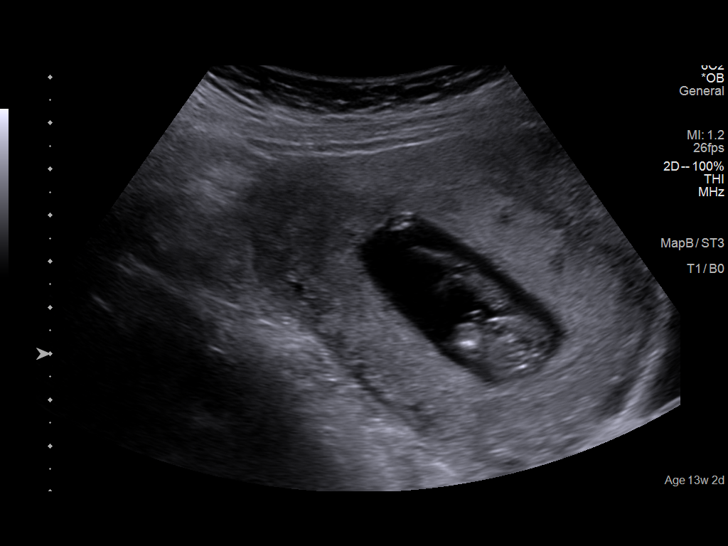
[im 12/27]
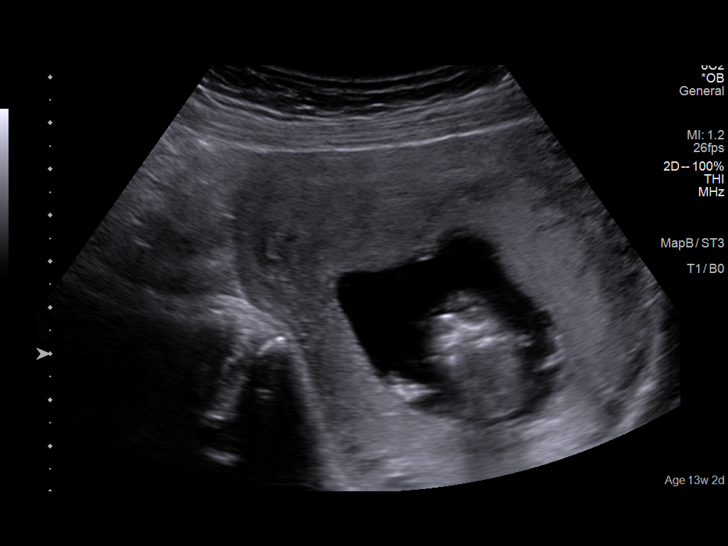
[im 14/27]
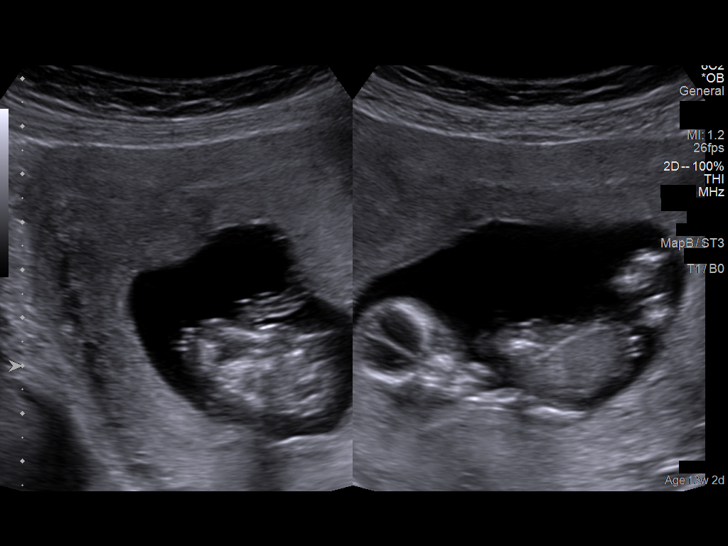
[im 16/27]
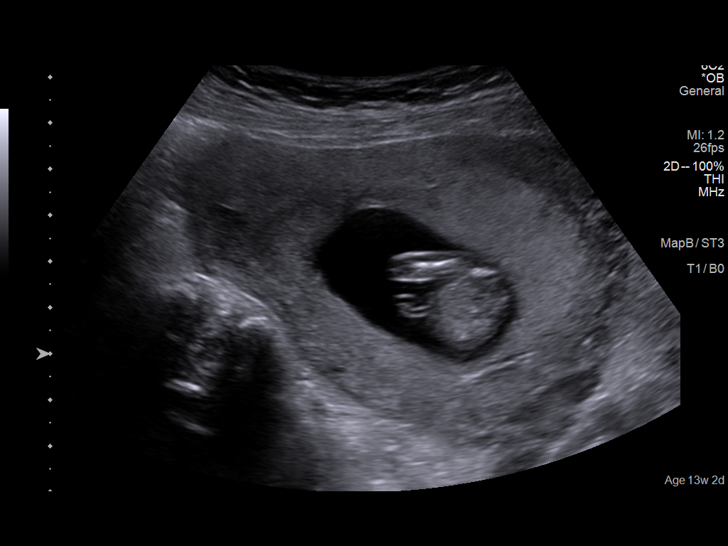
[im 18/27]
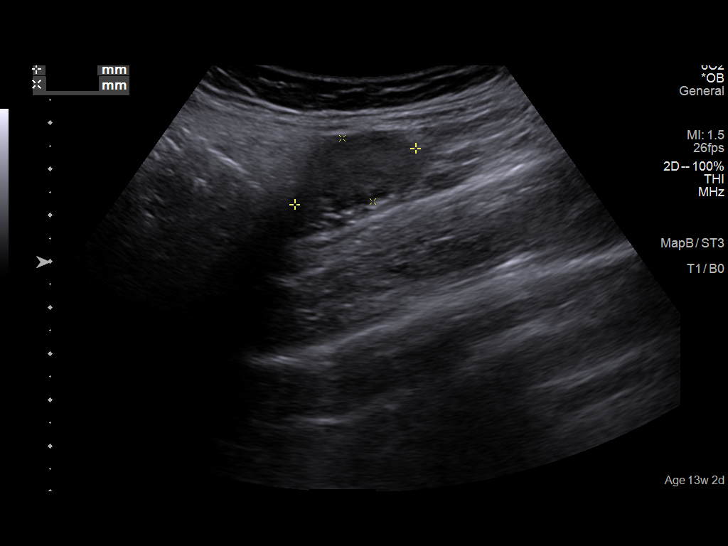
[im 20/27]
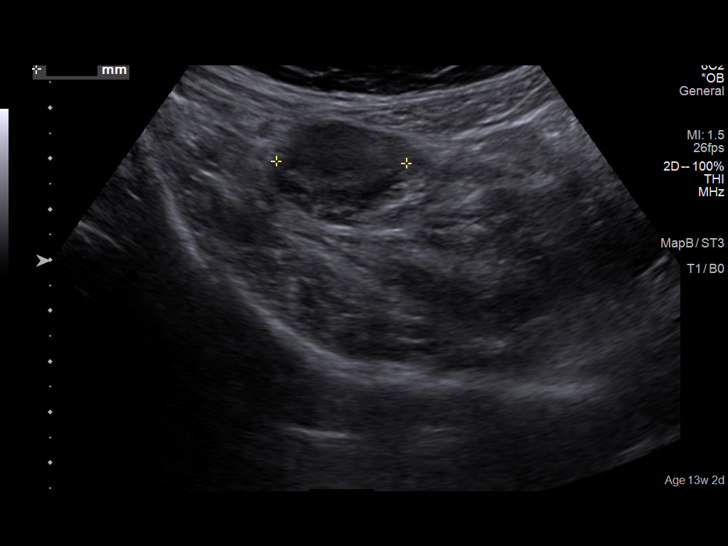
[im 22/27]
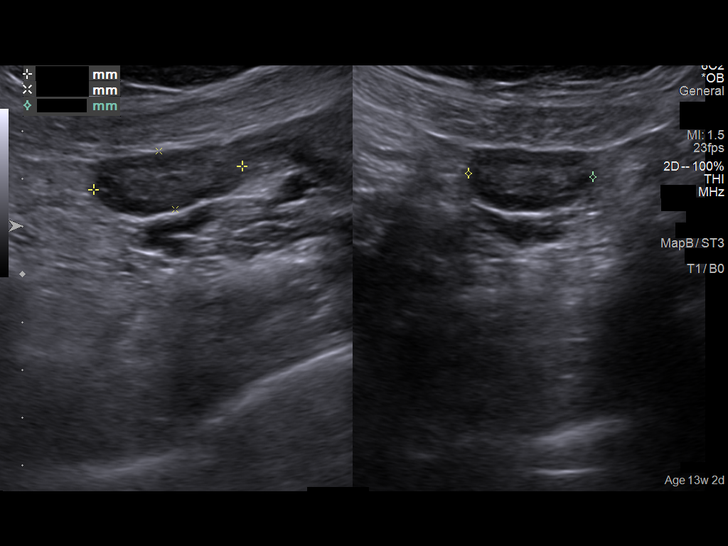
[im 24/27]
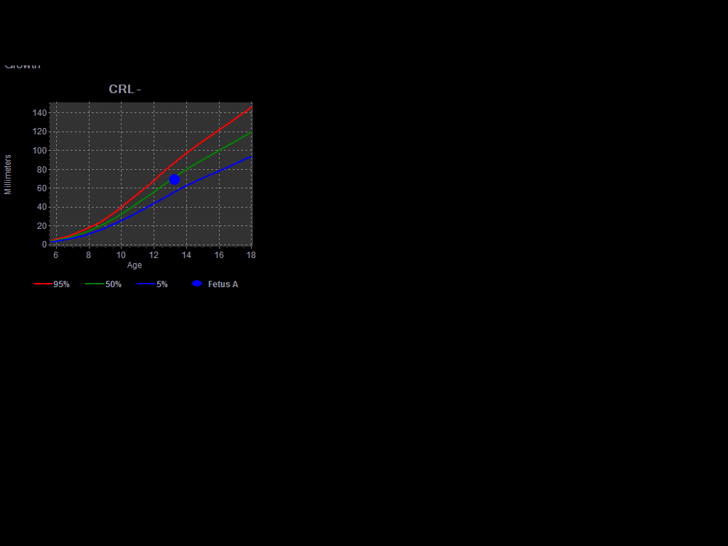
[im 26/27]
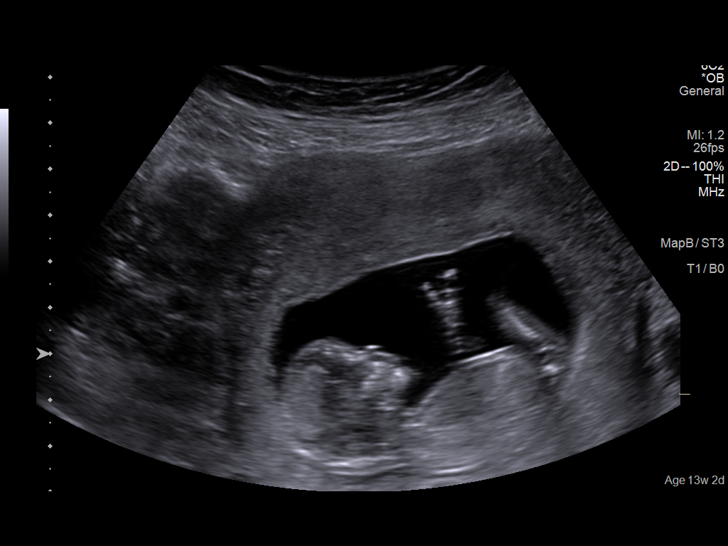

[13 of 27 positions shown; findings below may reference images not displayed]

IMPRESSION: Intrauterine pregnancy with a best estimated gestational age
 of 13 weeks 1 day.  Dating is based on today's ultrasound as
 patient states she is unsure of her LMP.

 Due to early gestational age, detailed images of fetal
 anatomy were not evaluated.   Presence of stomach, bladder
 and 4 extremities is noted.  Abdominal cord insertion appears
 grossly normal for early EGA.  Symmetric appearance of
 choroid plexus is noted.

 The nuchal translucency area appears unremarkable.

 Both maternal ovaries appear normal.

 Ms. Giannakos Boci had genetic counseling and desires cell
 free fetal DNA screening; this will be drawn today.

 Recommend follow up ultrasound for evaluation of fetal
 anatomy in about 5 weeks.
                 [place], [place]

## 2020-02-10 ENCOUNTER — Emergency Department
Admission: EM | Admit: 2020-02-10 | Discharge: 2020-02-10 | Disposition: A | Discharge status: Home / Self Care | Payer: Medicaid Other | Attending: Emergency Medicine | Admitting: Emergency Medicine

## 2020-02-10 ENCOUNTER — Emergency Department: Payer: Medicaid Other

## 2020-02-10 ENCOUNTER — Other Ambulatory Visit: Payer: Self-pay

## 2020-02-10 ENCOUNTER — Encounter: Payer: Self-pay | Admitting: Emergency Medicine

## 2020-02-10 DIAGNOSIS — K219 Gastro-esophageal reflux disease without esophagitis: Secondary | ICD-10-CM | POA: Insufficient documentation

## 2020-02-10 DIAGNOSIS — R079 Chest pain, unspecified: Secondary | ICD-10-CM | POA: Diagnosis present

## 2020-02-10 LAB — CBC
HCT: 44.4 % (ref 36.0–46.0)
Hemoglobin: 15.3 g/dL — ABNORMAL HIGH (ref 12.0–15.0)
MCH: 30.3 pg (ref 26.0–34.0)
MCHC: 34.5 g/dL (ref 30.0–36.0)
MCV: 87.9 fL (ref 80.0–100.0)
Platelets: 297 10*3/uL (ref 150–400)
RBC: 5.05 MIL/uL (ref 3.87–5.11)
RDW: 12.4 % (ref 11.5–15.5)
WBC: 6.7 10*3/uL (ref 4.0–10.5)
nRBC: 0 % (ref 0.0–0.2)

## 2020-02-10 LAB — BASIC METABOLIC PANEL
Anion gap: 9 (ref 5–15)
BUN: 10 mg/dL (ref 6–20)
CO2: 25 mmol/L (ref 22–32)
Calcium: 9.4 mg/dL (ref 8.9–10.3)
Chloride: 104 mmol/L (ref 98–111)
Creatinine, Ser: 0.75 mg/dL (ref 0.44–1.00)
GFR, Estimated: >60 mL/min (ref >60)
Glucose, Bld: 101 mg/dL — ABNORMAL HIGH (ref 70–99)
Potassium: 4 mmol/L (ref 3.5–5.1)
Sodium: 138 mmol/L (ref 135–145)

## 2020-02-10 LAB — TROPONIN I (HIGH SENSITIVITY): Troponin I (High Sensitivity): <2 ng/L (ref <18)

## 2020-02-10 MED ORDER — OMEPRAZOLE 10 MG PO CPDR: 10.0000 mg | DELAYED_RELEASE_CAPSULE | Freq: Every day | ORAL | 0 refills | Status: DC

## 2020-02-10 NOTE — ED Provider Notes (Signed)
[facility] Emergency Department Provider Note  ____________________________________________  Time seen: Approximately 4:10 PM  I have reviewed the triage vital signs and the nursing notes.   HISTORY  Chief Complaint Chest Pain    HPI Colleen Chapman is a 24 y.o. female who presents emergency department complaining of centralized chest pain off and on x3 weeks.  Patient denies any precipitating factors such as trauma or increased stress.  She states that she has no associated palpitations, radiation of pain.  She does endorse some reflux symptoms with belching and "burning".  No shortness of breath or difficulty breathing.  No history of cardiac problems in herself or close family members.  Patient denies any URI symptoms including fevers or chills, nasal congestion, sore throat.  No abdominal pain, emesis, diarrhea or constipation.  Patient has had some nausea associated with the pain.  No medications for his complaint prior to arrival.  Medical history as described below with no complaints with chronic medical issues.         Past Medical History:  Diagnosis Date  . ADHD (attention deficit hyperactivity disorder)    ongoing  . Depression    "Because I was forced to have an abortion"  . History of urinary tract infection   . On Depo-Provera for contraception   . Pregnancy induced hypertension    Preeclampsia with 2017 delivery    Patient Active Problem List   Diagnosis Date Noted  . Post-term pregnancy, 40-42 weeks of gestation 07/13/2019  . 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 Encounter for antenatal screening for chromosomal anomalies   . History of postpartum depression 2016 12/30/2018  . Hx of preeclampsia, prior pregnancy 2017 12/30/2018  . ADHD 12/30/2018  . Family history of deafness in mom 12/30/2018  . Supervision of high risk pregnancy, antepartum 12/30/2018  . Depression 04/17/2018    Past Surgical History:   Procedure Laterality Date  . EAB  01/2018  . NO PAST SURGERIES      Prior to Admission medications   Medication Sig Start Date End Date Taking? Authorizing Provider  acetaminophen (TYLENOL) 500 MG tablet Take 2 tablets (1,000 mg total) by mouth every 6 (six) hours as needed for mild pain or moderate pain. Patient not taking: Reported on 09/08/2019 07/15/19   Genia Del, CNM  ibuprofen (ADVIL) 600 MG tablet Take 1 tablet (600 mg total) by mouth every 6 (six) hours as needed for mild pain, moderate pain or cramping. Patient not taking: Reported on 09/08/2019 07/15/19   Genia Del, CNM  Multiple Vitamins-Minerals (MULTIVITAMIN WITH MINERALS) tablet Take 1 tablet by mouth daily. 09/29/19   Federico Flake, MD  omeprazole (PRILOSEC) 10 MG capsule Take 1 capsule (10 mg total) by mouth daily. 02/10/20   Shemeika Starzyk, Delorise Royals, PA-C  predniSONE (DELTASONE) 10 MG tablet Take 1 tablet (10 mg total) by mouth daily. 10/31/19   Emmilyn Crooke, Delorise Royals, PA-C  Prenatal Vit-Fe Fumarate-FA (PRENATAL VITAMIN) 27-0.8 MG TABS Take 1 tablet by mouth daily at 6 (six) AM. 11/18/18   Federico Flake, MD    Allergies Bactrim [sulfamethoxazole-trimethoprim]  Family History  Problem Relation Age of Onset  . Bipolar disorder Mother   . Anemia Mother   . Deafness Mother   . Birth defects Mother        born without hand  . Diabetes Father   . Hypertension Father   . Asthma Father   . Cancer Paternal Grandmother  Skin Ca    Social History Social History   Tobacco Use  . Smoking status: Never Smoker  . Smokeless tobacco: Never Used  Vaping Use  . Vaping Use: Never used  Substance Use Topics  . Alcohol use: Not Currently    Comment: Last ETOH use 10/2018 (wine cooler)  . Drug use: No     Review of Systems  Constitutional: No fever/chills Eyes: No visual changes. No discharge ENT: No upper respiratory complaints. Cardiovascular: Intermittent central chest pain x3  weeks Respiratory: no cough. No SOB. Gastrointestinal: No abdominal pain.  No nausea, no vomiting.  No diarrhea.  No constipation. Genitourinary: Negative for dysuria. No hematuria Musculoskeletal: Negative for musculoskeletal pain. Skin: Negative for rash, abrasions, lacerations, ecchymosis. Neurological: Negative for headaches, focal weakness or numbness.  10 System ROS otherwise negative.  ____________________________________________   PHYSICAL EXAM:  VITAL SIGNS: ED Triage Vitals [02/10/20 1430]  Enc Vitals Group     BP (!) 148/94     Pulse Rate (!) 58     Resp 16     Temp 98.5 F (36.9 C)     Temp Source Oral     SpO2 100 %     Weight 200 lb (90.7 kg)     Height 5\' 6"  (1.676 m)     Head Circumference      Peak Flow      Pain Score 0     Pain Loc      Pain Edu?      Excl. in GC?      Constitutional: Alert and oriented. Well appearing and in no acute distress. Eyes: Conjunctivae are normal. PERRL. EOMI. Head: Atraumatic. ENT:      Ears:       Nose: No congestion/rhinnorhea.      Mouth/Throat: Mucous membranes are moist.  Neck: No stridor.   Cardiovascular: Normal rate, regular rhythm. Normal S1 and S2.  No murmurs, rubs, gallops.  No apical heave.  Good peripheral circulation. Respiratory: Normal respiratory effort without tachypnea or retractions. Lungs CTAB. Good air entry to the bases with no decreased or absent breath sounds. Gastrointestinal: Bowel sounds 4 quadrants. Soft and nontender to palpation. No guarding or rigidity. No palpable masses. No distention. No CVA tenderness. Musculoskeletal: Full range of motion to all extremities. No gross deformities appreciated. Neurologic:  Normal speech and language. No gross focal neurologic deficits are appreciated.  Skin:  Skin is warm, dry and intact. No rash noted. Psychiatric: Mood and affect are normal. Speech and behavior are normal. Patient exhibits appropriate insight and  judgement.   ____________________________________________   LABS (all labs ordered are listed, but only abnormal results are displayed)  Labs Reviewed  BASIC METABOLIC PANEL - Abnormal; Notable for the following components:      Result Value   Glucose, Bld 101 (*)    All other components within normal limits  CBC - Abnormal; Notable for the following components:   Hemoglobin 15.3 (*)    All other components within normal limits  POC URINE PREG, ED  TROPONIN I (HIGH SENSITIVITY)  TROPONIN I (HIGH SENSITIVITY)   ____________________________________________  EKG  ED ECG REPORT I, Shawnay Bramel,  personally viewed and interpreted this ECG.   Date: 02/10/2020  EKG Time: 1312 hrs.  Rate: 80 bpm  Rhythm: normal EKG, normal sinus rhythm, unchanged from previous tracings  Axis: Normal axis  Intervals:none  ST&T Change: No ST elevation or depression noted  Normal sinus rhythm.  No STEMI.  Unchanged  from previous EKG on 06/17/2019   ____________________________________________  RADIOLOGY I personally viewed and evaluated these images as part of my medical decision making, as well as reviewing the written report by the radiologist.  ED Provider Interpretation: I agree with radiologist finding of no acute cardiopulmonary abnormality  DG Chest 2 View  Result Date: 02/10/2020 CLINICAL DATA:  Chest pain ongoing for 3 weeks. EXAM: CHEST - 2 VIEW COMPARISON:  Chest x-ray 10/31/2019, chest x-ray 06/16/2019 FINDINGS: The heart size and mediastinal contours are within normal limits. No focal consolidation. No pulmonary edema. No pleural effusion. No pneumothorax. No acute osseous abnormality. IMPRESSION: No active cardiopulmonary disease. Electronically Signed   By: Tish Frederickson M.D.   On: 02/10/2020 15:34    ____________________________________________    PROCEDURES  Procedure(s) performed:    Procedures    Medications - No data to  display   ____________________________________________   INITIAL IMPRESSION / ASSESSMENT AND PLAN / ED COURSE  Pertinent labs & imaging results that were available during my care of the patient were reviewed by me and considered in my medical decision making (see chart for details).  Review of the [place] CSRS was performed in accordance of the [facility] prior to dispensing any controlled drugs.           Patient's diagnosis is consistent with nonspecific chest pain, GERD.  Patient presented to the emergency department complaining of centralized chest pain with off-and-on symptoms with associated belching, nausea, burning reflux.  I suspect that patient's chest pain is secondary to GERD.  Physical exam is reassuring with no acute findings.  Labs are reassuring at this time.  Chest x-ray revealed no acute findings.  At this time, symptom control with acid reflux medicines of omeprazole.  Follow-up with primary care or cardiology if needed.  No further work-up deemed necessary at this time.  Differential included costochondritis, pneumonia, COVID-19, PE, ACS/STEMI.  Patient is given ED precautions to return to the ED for any worsening or new symptoms.     ____________________________________________  FINAL CLINICAL IMPRESSION(S) / ED DIAGNOSES  Final diagnoses:  Nonspecific chest pain  Gastroesophageal reflux disease, unspecified whether esophagitis present      NEW MEDICATIONS STARTED DURING THIS VISIT:  ED Discharge Orders         Ordered    omeprazole (PRILOSEC) 10 MG capsule  Daily        02/10/20 1621              This chart was dictated using voice recognition software/Dragon. Despite best efforts to proofread, errors can occur which can change the meaning. Any change was purely unintentional.    Racheal Patches, PA-C 02/10/20 1622    Chesley Noon, MD 02/10/20 [phone]

## 2020-02-10 NOTE — ED Triage Notes (Signed)
Pt comes into the ED via POV c/o central chest pain that has been ongoing x3 weeks.  Pt denies any radiating pain but has had some nausea with it.  Pt has even and unlabored respirations and denies any cardiac history.  Pt in NAD.

## 2020-02-10 NOTE — ED Notes (Signed)
Pt verbalized understanding of d/c instructions at this time. Pt denies further questions. Pt ambulatory to lobby, steady gait, NAD noted at this time

## 2020-02-11 ENCOUNTER — Ambulatory Visit (LOCAL_COMMUNITY_HEALTH_CENTER): Payer: Medicaid Other | Admitting: Advanced Practice Midwife

## 2020-02-11 ENCOUNTER — Encounter: Payer: Self-pay | Admitting: Advanced Practice Midwife

## 2020-02-11 ENCOUNTER — Ambulatory Visit: Payer: Medicaid Other

## 2020-02-11 VITALS — BP 123/74 | Ht 65.0 in | Wt 195.0 lb

## 2020-02-11 DIAGNOSIS — Z3042 Encounter for surveillance of injectable contraceptive: Secondary | ICD-10-CM

## 2020-02-11 DIAGNOSIS — E669 Obesity, unspecified: Secondary | ICD-10-CM

## 2020-02-11 DIAGNOSIS — Z30013 Encounter for initial prescription of injectable contraceptive: Secondary | ICD-10-CM | POA: Diagnosis not present

## 2020-02-11 DIAGNOSIS — Z3009 Encounter for other general counseling and advice on contraception: Secondary | ICD-10-CM

## 2020-02-11 LAB — PREGNANCY, URINE: Preg Test, Ur: NEGATIVE

## 2020-02-11 MED ORDER — MEDROXYPROGESTERONE ACETATE 150 MG/ML IM SUSP
150.0000 mg | Freq: Once | INTRAMUSCULAR | Status: AC
  Administered 2020-02-11: 150 mg via INTRAMUSCULAR

## 2020-02-11 NOTE — Progress Notes (Signed)
Pt is here for Depo restart today. Pt's last Depo was 10/08/2019, so pt is 18 weeks post last Depo. Pt reports uses condoms sometimes. Pt reports last sex without condom ~02/04/2020. Pt reports no issues with Depo other than nausea when depo runs out. Pt reports has concern about a knott in her leg.

## 2020-02-11 NOTE — Progress Notes (Signed)
UPT is negative today, so pt received Depo 150mg  IM today per provider order and pt tolerated well. Pt aware that for the next 7 days that she should either not have sex or use condoms every time. Pt aware to do home UPT 02/19/2020 and to give 02/21/2020 a call if it is positive and pt states understanding. Reminder card of when next Depo is due given to pt and pt aware to give [place] a call around that time so she can RTC for Depo. PCP list given to pt. Counseled pt per provider orders and pt states understanding. Provider orders completed.

## 2020-02-11 NOTE — Progress Notes (Signed)
Contraception/Family Planning VISIT ENCOUNTER NOTE  Subjective:   Colleen Chapman is a 24 y.o.SWF [id number] nonsmoker female here for reproductive life counseling.  Desires DMPA restart from [facility].  Reports she does not want a pregnancy in the next year. Denies abnormal vaginal bleeding, discharge, pelvic pain, problems with intercourse or other gynecologic concerns.    Gynecologic History No LMP recorded. Patient has had an injection. Contraception: condoms  Health Maintenance Due  Topic Date Due  . Hepatitis C Screening  Never done  . COVID-19 Vaccine (1) Never done  . PAP-Cervical Cytology Screening  06/26/2019  . INFLUENZA VACCINE  10/03/2019   LMP ? Spotting.  Last sex 02/05/20 with condom; with current partner x 6 years. Last DMPA  10/08/19.  Denies cigs, vaping.  Last physical 09/08/2019.  Last DMPA 10/08/19.  Last pap 09/08/19 ASCUS HPV + C/o "bump" on right thigh x "years" without erythema or pain.  Referred to primary care MD  The following portions of the patient's history were reviewed and updated as appropriate: allergies, current medications, past family history, past medical history, past social history, past surgical history and problem list.  Review of Systems Pertinent items are noted in HPI.   Objective:  BP 123/74   Ht 5\' 5"  (1.651 m)   Wt 195 lb (88.5 kg)   Breastfeeding No   BMI 32.45 kg/m  Gen: well appearing, NAD HEENT: no scleral icterus CV: RR Lung: Normal WOB Ext: warm well perfused     Assessment and Plan:   Contraception counseling: Reviewed all forms of birth control options in the tiered based approach. available including abstinence; over the counter/barrier methods; hormonal contraceptive medication including pill, patch, ring, injection,contraceptive implant, ECP; hormonal and nonhormonal IUDs; permanent sterilization options including vasectomy and the various tubal sterilization modalities. Risks, benefits, and typical effectiveness rates were  reviewed.  Questions were answered.  Written information was also given to the patient to review.  Patient desires DMPA restart, this was prescribed for patient. She will follow up in 11-13 wks for surveillance.  She was told to call with any further questions, or with any concerns about this method of contraception.  Emphasized use of condoms 100% of the time for STI prevention.  Patient was offered ECP. ECP was not accepted by the patient. ECP counseling was not given - see RN documentation  1. Obesity, unspecified classification, unspecified obesity type, unspecified whether serious comorbidity present   2. Family planning Please give primary care MD list to pt - Pregnancy, urine  3. Encounter for initial prescription of injectable contraceptive If PT neg today may have DMPA 150 mg IM q 11-13 wks until 09/2020 with pap and physical Please counsel on need for abstinance/back up condoms next 7 days Please counsel pt to do PT 02/19/20 and call if +    Please refer to After Visit Summary for other counseling recommendations.   Return for 11-13 wk DMPA.  02/21/20, CNM [facility] DEPARTMENT

## 2020-05-15 ENCOUNTER — Other Ambulatory Visit: Payer: Self-pay

## 2020-05-15 ENCOUNTER — Ambulatory Visit (LOCAL_COMMUNITY_HEALTH_CENTER): Payer: Medicaid Other

## 2020-05-15 VITALS — BP 132/74 | Ht 65.0 in | Wt 195.5 lb

## 2020-05-15 DIAGNOSIS — Z3042 Encounter for surveillance of injectable contraceptive: Secondary | ICD-10-CM

## 2020-05-15 DIAGNOSIS — Z3009 Encounter for other general counseling and advice on contraception: Secondary | ICD-10-CM | POA: Diagnosis not present

## 2020-05-15 MED ORDER — MEDROXYPROGESTERONE ACETATE 150 MG/ML IM SUSP
150.0000 mg | Freq: Once | INTRAMUSCULAR | Status: AC
  Administered 2020-05-15: 150 mg via INTRAMUSCULAR

## 2020-05-15 NOTE — Progress Notes (Signed)
13 weeks 3 days post depo. Voices no concerns. Depo given today left UOQ per order by Hazle Coca, CNM dated  09/08/2019. Tolerated well.  Next depo due 07/31/2020, pt aware. Jerel Shepherd, RN

## 2020-08-19 ENCOUNTER — Ambulatory Visit
Admission: EM | Admit: 2020-08-19 | Discharge: 2020-08-19 | Disposition: A | Discharge status: Home / Self Care | Payer: Medicaid Other | Attending: Emergency Medicine | Admitting: Emergency Medicine

## 2020-08-19 ENCOUNTER — Other Ambulatory Visit: Payer: Self-pay

## 2020-08-19 DIAGNOSIS — H66002 Acute suppurative otitis media without spontaneous rupture of ear drum, left ear: Secondary | ICD-10-CM

## 2020-08-19 DIAGNOSIS — H6123 Impacted cerumen, bilateral: Secondary | ICD-10-CM

## 2020-08-19 DIAGNOSIS — H6122 Impacted cerumen, left ear: Secondary | ICD-10-CM

## 2020-08-19 MED ORDER — AMOXICILLIN-POT CLAVULANATE 875-125 MG PO TABS: 1.0000 | ORAL_TABLET | Freq: Two times a day (BID) | ORAL | 0 refills | Status: AC

## 2020-08-19 NOTE — ED Triage Notes (Signed)
Patient complains of left ear pain that feels like she has pressure. States that she was recently at the beach and went [place] the water slide and has been having issues since.

## 2020-08-19 NOTE — ED Provider Notes (Signed)
[facility]    CSN: [id number] Arrival date & time: 08/19/20  1131      History   Chief Complaint Chief Complaint  Patient presents with   Ear Pain    left    HPI Colleen Chapman is a 25 y.o. female.   HPI  25 year old female here for evaluation of left ear complaint.  Patient reports that she went down a water slide approximately a week ago and since then she has not been able to hear out of her left ear.  She states that she has some intermittent ringing but denies any fever, pain, or dizziness.  She also denies any upper respiratory symptoms such as runny nose or nasal congestion.  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 Authorizing Provider  amoxicillin-clavulanate (AUGMENTIN) 875-125 MG tablet Take 1 tablet by mouth every 12 (twelve) hours for 10 days. 08/19/20 08/29/20 Yes Becky Augusta, NP    Family History Family History  Problem Relation Age of Onset   Bipolar disorder Mother     Anemia Mother    Deafness Mother    Birth defects Mother        born without hand   Diabetes Father    Hypertension Father    Asthma Father    Cancer Paternal Grandmother        Skin Ca    Social History Social History   Tobacco Use   Smoking status: Never   Smokeless tobacco: Never  Vaping Use   Vaping Use: Never used  Substance Use Topics   Alcohol use: Not Currently    Comment: Last ETOH use 10/2018 (wine cooler)   Drug use: No     Allergies   Bactrim [sulfamethoxazole-trimethoprim]   Review of Systems Review of Systems  Constitutional:  Negative for fever.  HENT:  Positive for hearing loss and tinnitus. Negative for congestion, ear discharge, ear pain and rhinorrhea.   Neurological:  Negative for dizziness.    Physical Exam Triage Vital Signs ED Triage Vitals  Enc Vitals Group     BP 08/19/20 1153 124/89     Pulse Rate 08/19/20 1153 66     Resp 08/19/20  1153 18     Temp 08/19/20 1153 98.3 F (36.8 C)     Temp Source 08/19/20 1153 Oral     SpO2 08/19/20 1153 100 %     Weight 08/19/20 1152 196 lb (88.9 kg)     Height 08/19/20 1152 5\' 5"  (1.651 m)     Head Circumference --      Peak Flow --      Pain Score 08/19/20 1151 4     Pain Loc --      Pain Edu? --      Excl. in GC? --    No data found.  Updated Vital Signs BP 124/89 (BP Location: Left Arm)   Pulse 66   Temp 98.3 F (36.8 C) (Oral)   Resp 18   Ht 5\' 5"  (1.651 m)   Wt 196 lb (88.9 kg)   SpO2 100%   BMI 32.62 kg/m   Visual Acuity Right Eye Distance:   Left Eye Distance:   Bilateral Distance:    Right Eye Near:   Left Eye Near:    Bilateral Near:     Physical Exam Vitals and nursing note reviewed.  Constitutional:      General: She is not in acute distress.    Appearance: Normal appearance. She is not ill-appearing.  HENT:     Head: Normocephalic and atraumatic.     Right Ear: There is impacted cerumen.     Left Ear: There is impacted cerumen.  Skin:    General: Skin is  warm and dry.     Capillary Refill: Capillary refill takes less than 2 seconds.     Findings: No erythema or rash.  Neurological:     General: No focal deficit present.     Mental Status: She is alert.  Psychiatric:        Mood and Affect: Mood normal.        Behavior: Behavior normal.        Thought Content: Thought content normal.        Judgment: Judgment normal.     UC Treatments / Results  Labs (all labs ordered are listed, but only abnormal results are displayed) Labs Reviewed - No data to display  EKG   Radiology No results found.  Procedures Procedures (including critical care time)  Medications Ordered in UC Medications - No data to display  Initial Impression / Assessment and Plan / UC Course  I have reviewed the triage vital signs and the nursing notes.  Pertinent labs & imaging results that were available during my care of the patient were reviewed by me and considered in my medical decision making (see chart for details).  Patient is a very pleasant, nontoxic-appearing 25 year old female here for evaluation of loss of hearing in her left ear since going down a water slide 1 week ago.  She denies any kind of trauma, pain, or dizziness.  She does have some ringing in her ear intermittently.  Physical exam reveals cerumen impaction in the left external auditory canal.  Right external auditory canal also has a cerumen impaction.  Will order ear lavage and reassess.  After ear lavage patient reports that she still cannot hear out of her left ear.  Visualization of the left ear shows and injected and erythematous tympanic membrane with a loss of landmarks.  Right EAC continues to have cerumen present but I can visualize the tympanic membrane beyond and is pearly gray.  We will treat patient for otitis media  with Augmentin twice daily for 10 days.   Final Clinical Impressions(s) / UC Diagnoses   Final diagnoses:  Non-recurrent acute suppurative otitis media of left  ear without spontaneous rupture of tympanic membrane  Impacted cerumen of left ear     Discharge Instructions      Take the Augmentin twice daily for 10 days with food for treatment of your ear infection.  Take an over-the-counter probiotic 1 hour after each dose of antibiotic to prevent diarrhea.  Use over-the-counter Tylenol and ibuprofen as needed for pain or fever.  Place a hot water bottle, or heating pad, underneath your pillowcase at night to help dilate up your ear and aid in pain relief as well as resolution of the infection.  Return for reevaluation for any new or worsening symptoms.      ED Prescriptions     Medication Sig Dispense Auth. Provider   amoxicillin-clavulanate (AUGMENTIN) 875-125 MG tablet Take 1 tablet by mouth every 12 (twelve) hours for 10 days. 20 tablet Becky Augusta, NP      PDMP not reviewed this encounter.   Becky Augusta, NP 08/19/20 1310

## 2020-08-19 NOTE — Discharge Instructions (Addendum)
Take the Augmentin twice daily for 10 days with food for treatment of your ear infection.  Take an over-the-counter probiotic 1 hour after each dose of antibiotic to prevent diarrhea.  Use over-the-counter Tylenol and ibuprofen as needed for pain or fever.  Place a hot water bottle, or heating pad, underneath your pillowcase at night to help dilate up your ear and aid in pain relief as well as resolution of the infection.  Return for reevaluation for any new or worsening symptoms.  

## 2020-08-31 ENCOUNTER — Other Ambulatory Visit: Payer: Self-pay

## 2020-08-31 ENCOUNTER — Ambulatory Visit (LOCAL_COMMUNITY_HEALTH_CENTER): Payer: Medicaid Other | Admitting: Advanced Practice Midwife

## 2020-08-31 DIAGNOSIS — Z3009 Encounter for other general counseling and advice on contraception: Secondary | ICD-10-CM

## 2020-08-31 DIAGNOSIS — Z3042 Encounter for surveillance of injectable contraceptive: Secondary | ICD-10-CM | POA: Diagnosis not present

## 2020-08-31 MED ORDER — MEDROXYPROGESTERONE ACETATE 150 MG/ML IM SUSP
150.0000 mg | Freq: Once | INTRAMUSCULAR | Status: AC
  Administered 2020-08-31: 17:00:00 150 mg via INTRAMUSCULAR

## 2020-08-31 NOTE — Progress Notes (Signed)
Patient here for Depo at 15 3/7 since last Depo. Depo given today per [facility] orders from 09/08/2019. Patient scheduled for PE and repeat Pap, due 09/2020, on 09/11/2020. Patient given Depo, right glute, tolerated well, next Depo card given.Burt Knack, RN

## 2020-09-11 ENCOUNTER — Encounter: Payer: Self-pay | Admitting: Advanced Practice Midwife

## 2020-09-11 ENCOUNTER — Ambulatory Visit (LOCAL_COMMUNITY_HEALTH_CENTER): Payer: Medicaid Other | Admitting: Advanced Practice Midwife

## 2020-09-11 ENCOUNTER — Other Ambulatory Visit: Payer: Self-pay

## 2020-09-11 VITALS — BP 114/80 | Ht 66.0 in | Wt 192.0 lb

## 2020-09-11 DIAGNOSIS — R87610 Atypical squamous cells of undetermined significance on cytologic smear of cervix (ASC-US): Secondary | ICD-10-CM

## 2020-09-11 DIAGNOSIS — R87619 Unspecified abnormal cytological findings in specimens from cervix uteri: Secondary | ICD-10-CM

## 2020-09-11 DIAGNOSIS — Z3009 Encounter for other general counseling and advice on contraception: Secondary | ICD-10-CM

## 2020-09-11 DIAGNOSIS — Z3042 Encounter for surveillance of injectable contraceptive: Secondary | ICD-10-CM

## 2020-09-11 HISTORY — DX: Unspecified abnormal cytological findings in specimens from cervix uteri: R87.619

## 2020-09-11 MED ORDER — MEDROXYPROGESTERONE ACETATE 150 MG/ML IM SUSP: 150.0000 mg | INTRAMUSCULAR | Status: AC

## 2020-09-11 NOTE — Progress Notes (Signed)
Wet mount results reviewed, no treatment required.  Pt given a pamphlet on HPV and a Dental list. Berdie Ogren, RN

## 2020-09-11 NOTE — Progress Notes (Signed)
[facility] DEPARTMENT [facility] [address] Main Number: [phone]    Family Planning Visit- Initial Visit  Subjective:  Colleen Chapman is a 25 y.o. SWF [id number] (1,6, 4) nonsmoker  being seen today for an initial annual visit and to discuss contraceptive options.  The patient is currently using Depo Provera for pregnancy prevention. Patient reports she does not want a pregnancy in the next year.  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  Chief Complaint  Patient presents with   Annual Exam    Patient reports here for physical and pap. Last pap 09/08/19 ASCUS +HPV. Had all 3 HPV vaccines. 06/25/16 pap neg. Last DMPA 08/31/20. LMP not on DMPA. Last PE 09/08/19. Last sex yesterday with condom; with current partner x 7 years; 1 partner in last 3 mo. Working 40 hrs/wk and living with FOB and 3 kids. Last ETOH 2022 (1 mixed drink) rarely  Patient denies cigs, vaping, cigars, MJ  Body mass index is 30.99 kg/m. - Patient is eligible for diabetes screening based on BMI and age >75?  not applicable HA1C ordered? not applicable  Patient reports 1  partner/s in last year. Desires STI screening?  Yes  Has patient been screened once for HCV in the past?  No  No results found for: HCVAB  Does the patient have current drug use (including MJ), have a partner with drug use, and/or has been incarcerated since last result? No  If yes-- Screen for HCV through [facility]   Does the patient meet criteria for HBV testing? No  Criteria:  -Household, sexual or needle sharing contact with HBV -History of drug use -HIV positive -Those with known Hep C   Health Maintenance Due  Topic  Date Due   COVID-19 Vaccine (1) Never done   HPV VACCINES (1 - 2-dose series) Never done   Hepatitis C Screening  Never done   PAP-Cervical Cytology Screening  06/26/2019   PAP SMEAR-Modifier  09/07/2020    Review of Systems  All other systems reviewed and are negative.  The following portions of the patient's history were reviewed and updated as appropriate: allergies, current medications, past family history, past medical history, past social history, past surgical history and problem list. Problem list updated.   See flowsheet for other program required questions.  Objective:   Vitals:   09/11/20 1550  BP: 114/80  Weight: 192 lb (87.1 kg)  Height: 5\' 6"  (1.676 m)    Physical Exam Constitutional:      Appearance: Normal appearance. She is obese.  HENT:     Head: Normocephalic and atraumatic.     Mouth/Throat:     Mouth: Mucous membranes are moist.     Comments: Last dental exam age 22; encouraged exam asap Eyes:     Conjunctiva/sclera: Conjunctivae normal.  Neck:     Thyroid: No thyroid mass, thyromegaly or thyroid tenderness.  Cardiovascular:     Rate and Rhythm: Normal rate and regular rhythm.  Pulmonary:     Effort: Pulmonary effort is normal.     Breath sounds: Normal breath sounds.  Abdominal:     Palpations: Abdomen is soft.     Comments: Soft without masses or tenderness, poor tone  Genitourinary:  General: Normal vulva.     Exam position: Lithotomy position.     Vagina: Vaginal discharge (white creamy leukorrhea, ph>4.5) present.     Cervix: Friability (friable to pap) present.     Uterus: Normal.      Adnexa: Right adnexa normal and left adnexa normal.     Rectum: Normal.     Comments: Pap done Musculoskeletal:        General: Normal range of motion.     Cervical back: Normal range of motion and neck supple.  Skin:    General: Skin is warm and dry.  Neurological:     Mental Status: She is alert.  Psychiatric:        Mood and Affect: Mood  normal.      Assessment and Plan:  Colleen Chapman is a 25 y.o. female presenting to the [facility] Department for an initial annual wellness/contraceptive visit  Contraception counseling: Reviewed all forms of birth control options in the tiered based approach. available including abstinence; over the counter/barrier methods; hormonal contraceptive medication including pill, patch, ring, injection,contraceptive implant, ECP; hormonal and nonhormonal IUDs; permanent sterilization options including vasectomy and the various tubal sterilization modalities. Risks, benefits, and typical effectiveness rates were reviewed.  Questions were answered.  Written information was also given to the patient to review.  Patient desires physical and pap, this was prescribed for patient. She will follow up in  11-13 wks for surveillance.  She was told to call with any further questions, or with any concerns about this method of contraception.  Emphasized use of condoms 100% of the time for STI prevention.  Patient was offered ECP. ECP was not accepted by the patient. ECP counseling was not given - see RN documentation  1. Family planning Please give pt dental list and encourage exam Please give pt primary care MD list Treat wet mount per standing orders Immunization nurse consult Please give HPV brochure to pt - WET PREP FOR TRICH, YEAST, CLUE - Chlamydia/Gonorrhea [facility] - IGP, rfx Aptima HPV ASCU  2. Encounter for surveillance of injectable contraceptive DMPA 150 mg IM q 11-13 wks x 1 year (not today)  3. Atypical squamous cells of undetermined significance on cytologic smear of cervix (ASC-US) Repeat pap done today    No follow-ups on file.  No future appointments.  [name], CNM

## 2020-09-11 NOTE — Progress Notes (Signed)
Pt to clinic for physical and pap. Pt is 1.4 weeks post depo today.

## 2020-09-12 LAB — IGP, RFX APTIMA HPV ASCU: PAP Smear Comment: 0

## 2020-09-12 LAB — WET PREP FOR TRICH, YEAST, CLUE
Trichomonas Exam: NEGATIVE
Yeast Exam: NEGATIVE

## 2020-09-13 ENCOUNTER — Telehealth: Payer: Self-pay | Admitting: Family Medicine

## 2020-09-13 NOTE — Telephone Encounter (Signed)
Says lab results from PAP were sent to [facility] and she does not understand what they mean and would like someone to call her please

## 2020-09-13 NOTE — Telephone Encounter (Signed)
Phone call to pt. Reviewed pap result from 09/11/20 and the provider's instruction indicated for next pap.

## 2020-10-05 ENCOUNTER — Encounter: Payer: Self-pay | Admitting: Nurse Practitioner

## 2020-12-12 IMAGING — CR DG CHEST 2V
2 series · 2 of 2 positions shown · non-contrast
Comparison: Chest x-ray 10/31/2019, chest x-ray 06/16/2019

CLINICAL DATA: Chest pain ongoing for 3 weeks.

EXAM:
CHEST - 2 VIEW

[chest pa]
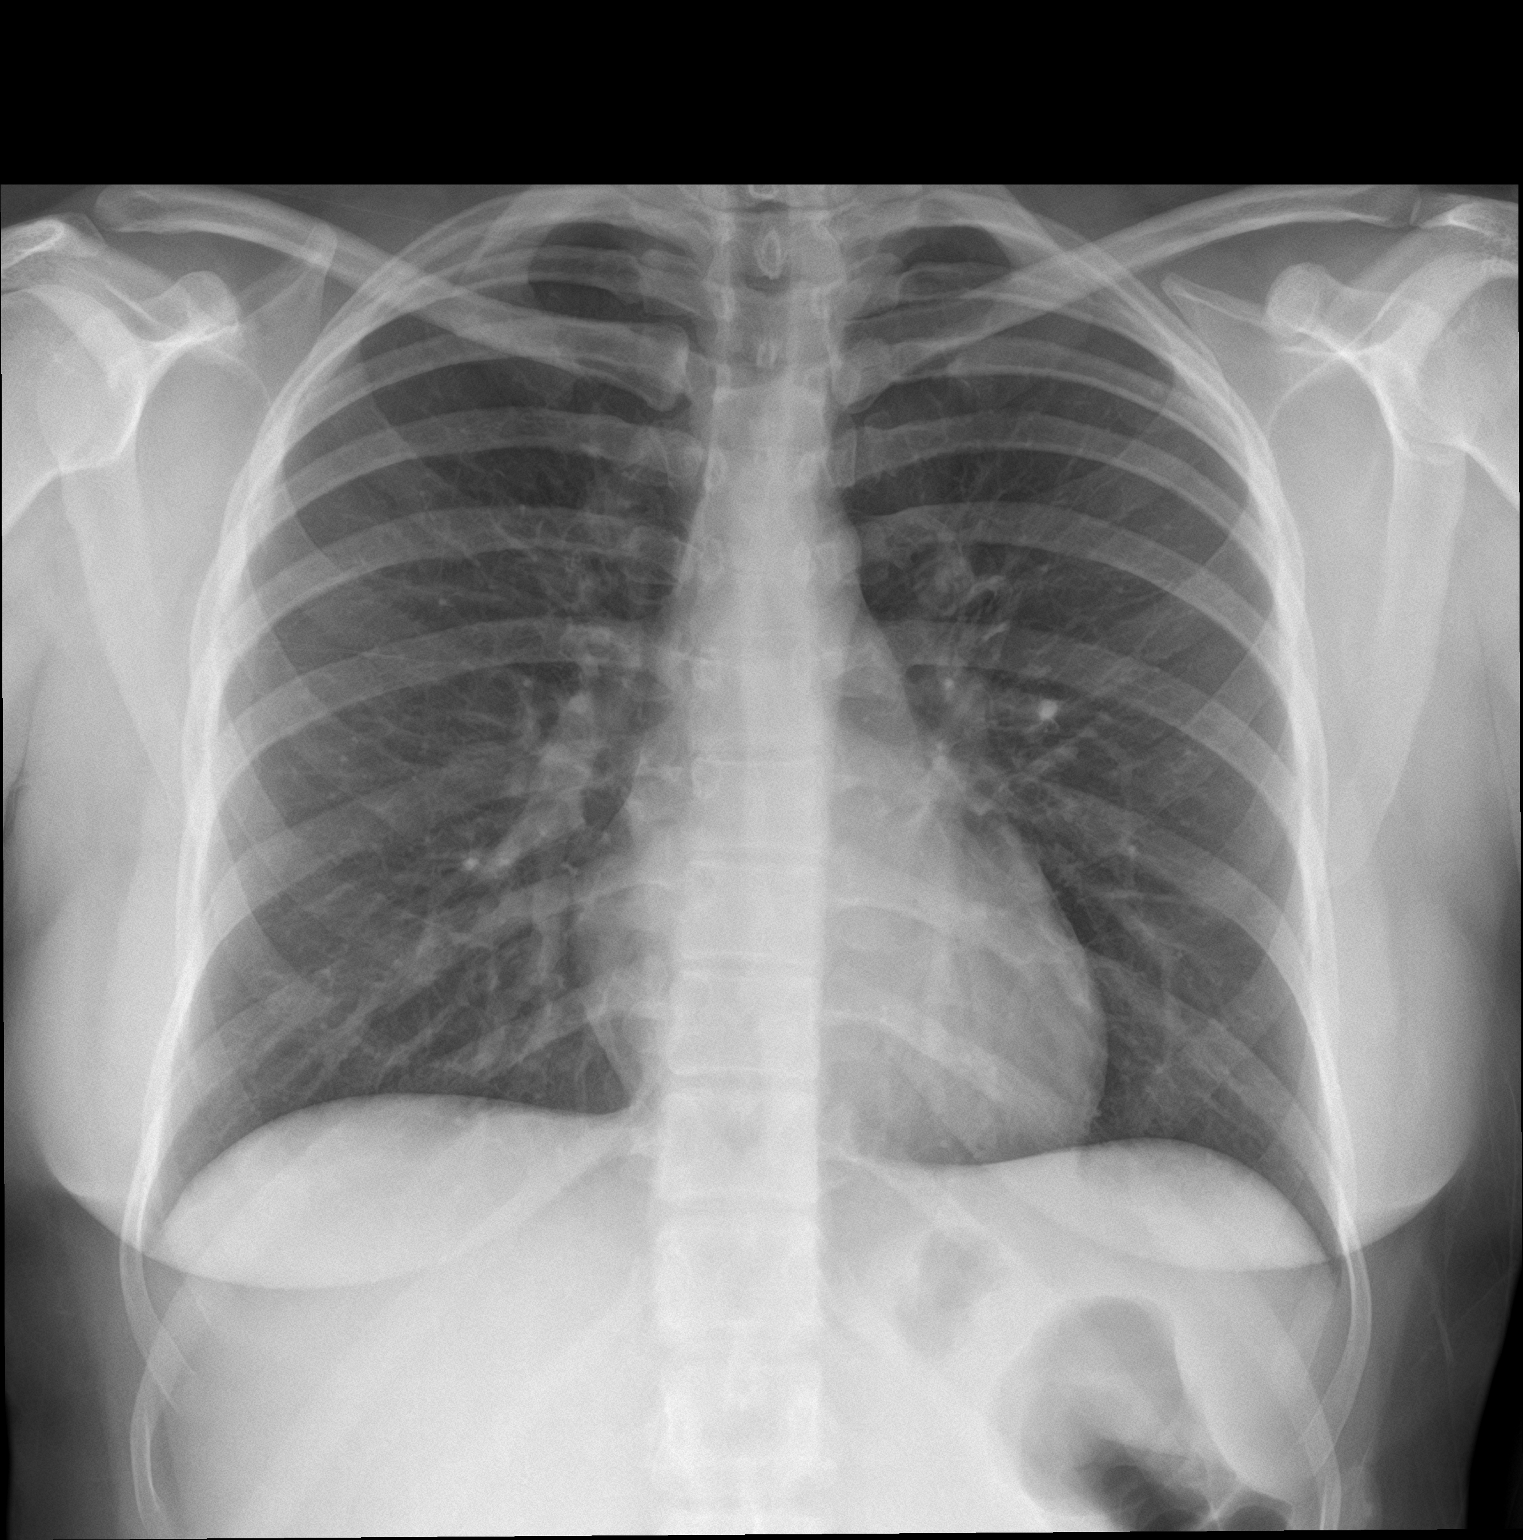

[chest lat]
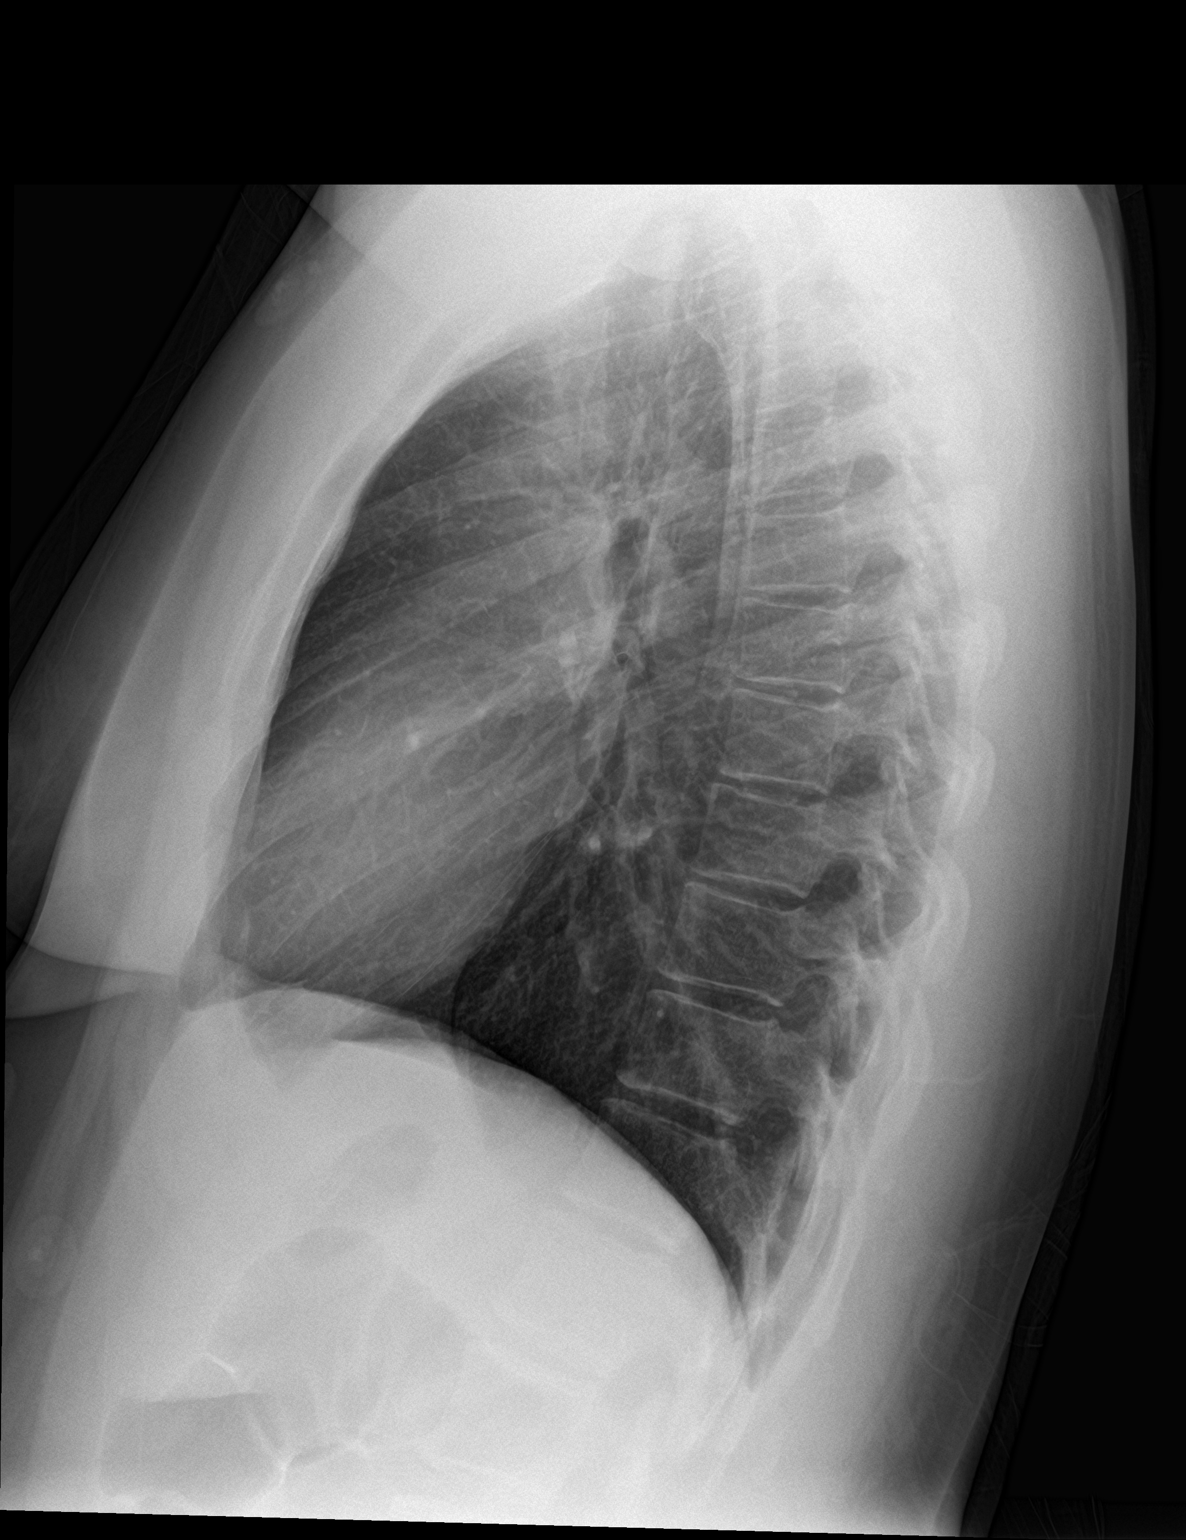

[2 of 2 positions shown; findings below may reference images not displayed]

FINDINGS: The heart size and mediastinal contours are within normal limits.

No focal consolidation. No pulmonary edema. No pleural effusion. No
pneumothorax.

No acute osseous abnormality.
IMPRESSION: No active cardiopulmonary disease.

## 2021-10-20 ENCOUNTER — Emergency Department
Admission: EM | Admit: 2021-10-20 | Discharge: 2021-10-20 | Disposition: A | Discharge status: Home / Self Care | Payer: Medicaid Other | Attending: Emergency Medicine | Admitting: Emergency Medicine

## 2021-10-20 ENCOUNTER — Other Ambulatory Visit: Payer: Self-pay

## 2021-10-20 ENCOUNTER — Encounter: Payer: Self-pay | Admitting: Emergency Medicine

## 2021-10-20 DIAGNOSIS — Z3202 Encounter for pregnancy test, result negative: Secondary | ICD-10-CM | POA: Insufficient documentation

## 2021-10-20 DIAGNOSIS — Z3200 Encounter for pregnancy test, result unknown: Secondary | ICD-10-CM | POA: Diagnosis present

## 2021-10-20 DIAGNOSIS — Z3201 Encounter for pregnancy test, result positive: Secondary | ICD-10-CM | POA: Diagnosis not present

## 2021-10-20 LAB — HCG, QUANTITATIVE, PREGNANCY: hCG, Beta Chain, Quant, S: 1 m[IU]/mL (ref <5)

## 2021-10-20 LAB — POC URINE PREG, ED: Preg Test, Ur: NEGATIVE

## 2021-10-20 NOTE — ED Notes (Signed)
Patient states she has been feeling "off" for some weeks, fatigue and nausea, occasional  breast tenderness.

## 2021-10-20 NOTE — ED Provider Notes (Signed)
[facility] Provider Note    Event Date/Time   First MD Initiated Contact with Patient 10/20/21 1056     (approximate)   History   Possible Pregnancy   HPI  Colleen Chapman is a 26 y.o. female with no significant past medical history presents emergency department with concerns of being pregnant.  Patient states she has taken several home test which have faint lines.  States she quit taking Depo 1 year ago but has not had a period since then.  No fever or chills.  No abdominal pain, no cramping or bleeding      Physical Exam   Triage Vital Signs: ED Triage Vitals  Enc Vitals Group     BP 10/20/21 1028 132/82     Pulse Rate 10/20/21 1028 76     Resp 10/20/21 1028 17     Temp 10/20/21 1028 98.3 F (36.8 C)     Temp Source 10/20/21 1028 Oral     SpO2 10/20/21 1028 98 %     Weight 10/20/21 1018 200 lb (90.7 kg)     Height 10/20/21 1018 5\' 6"  (1.676 m)     Head Circumference --      Peak Flow --      Pain Score 10/20/21 1018 0     Pain Loc --      Pain Edu? --      Excl. in GC? --     Most recent vital signs: Vitals:   10/20/21 1028  BP: 132/82  Pulse: 76  Resp: 17  Temp: 98.3 F (36.8 C)  SpO2: 98%     General: Awake, no distress.   CV:  Good peripheral perfusion. regular rate and  rhythm Resp:  Normal effort.  Abd:  No distention.  Nontender Other:      ED Results / Procedures / Treatments   Labs (all labs ordered are listed, but only abnormal results are displayed) Labs Reviewed  POC URINE PREG, ED - Normal  HCG, QUANTITATIVE, PREGNANCY     EKG     RADIOLOGY     PROCEDURES:   Procedures   MEDICATIONS ORDERED IN ED: Medications - No data to display   IMPRESSION / MDM / ASSESSMENT AND PLAN / ED COURSE  I reviewed the triage vital signs and the nursing notes.                              Differential diagnosis includes, but is not limited to, negative pregnancy test, positive pregnancy test,  miscarriage  Patient's presentation is most consistent with acute complicated illness / injury requiring diagnostic workup.   Patient's labs are reassuring, POC pregnancy is negative, beta-hCG is less than 1.  I did explain these findings to the patient.  Told her with the beta-hCG being less than 1 that she is not pregnant.  She is to follow-up with [facility] department for family-planning.  Or her regular doctor.  Explained to her on how to use over-the-counter pregnancy test.  She was discharged stable condition.      FINAL CLINICAL IMPRESSION(S) / ED DIAGNOSES   Final diagnoses:  Negative pregnancy test     Rx / DC Orders   ED Discharge Orders     None        Note:  This document was prepared using Dragon voice recognition software and may include unintentional dictation errors.    [facility],  PA-C 10/20/21 1208    Shaune Pollack, MD 10/21/21 [phone]

## 2021-10-20 NOTE — Discharge Instructions (Signed)
Your blood test and urine test are both negative.  You are not pregnant.  Follow up with your regular doctor or the health department for any family planning concerns If you take a home pregnancy test, you should use your first urine of the day

## 2021-10-20 NOTE — ED Triage Notes (Signed)
Pt via POV from home. Pt thinks she might be pregnant. States that she has not had a period in 2 years and stopped her Depot shot 1 year ago and never had a period since. Pt states she recently took a pregnancy test and it showed a faint line and she is here to confirm. Denies pain but states "some bleeding" Pt is A&Ox4 and NAD

## 2021-10-20 NOTE — ED Notes (Signed)
Patient declined discharge vital signs. 

## 2022-02-12 ENCOUNTER — Ambulatory Visit: Payer: Medicaid Other

## 2022-05-06 DIAGNOSIS — J020 Streptococcal pharyngitis: Secondary | ICD-10-CM | POA: Diagnosis not present

## 2022-05-06 DIAGNOSIS — J029 Acute pharyngitis, unspecified: Secondary | ICD-10-CM | POA: Diagnosis not present

## 2022-08-19 ENCOUNTER — Emergency Department
Admission: EM | Admit: 2022-08-19 | Discharge: 2022-08-19 | Disposition: A | Discharge status: Home / Self Care | Payer: Medicaid Other | Attending: Emergency Medicine | Admitting: Emergency Medicine

## 2022-08-19 DIAGNOSIS — Z3202 Encounter for pregnancy test, result negative: Secondary | ICD-10-CM | POA: Diagnosis not present

## 2022-08-19 LAB — HCG, QUANTITATIVE, PREGNANCY: hCG, Beta Chain, Quant, S: 4 m[IU]/mL (ref <5)

## 2022-08-19 LAB — POC URINE PREG, ED: Preg Test, Ur: NEGATIVE

## 2022-08-19 NOTE — ED Notes (Signed)
See triage notes. Patient is approximately three weeks late for her menstrual period. Patient is concerned she may be pregnant and has had some cramping.

## 2022-08-19 NOTE — ED Provider Notes (Signed)
[facility] Emergency Department Provider Note     Event Date/Time   First MD Initiated Contact with Patient 08/19/22 1627     (approximate)   History   Possible Pregnancy   HPI  Colleen Chapman is a 27 y.o. female with no significant past medical history who presents to the ED for confirmation of possible at home pregnancy test. Patient reports her menstrual cycle is 3 weeks late.  Patient reports she is having similar symptoms to previous pregnancies and at home pregnancy test is revealing a faint line on multiple test.  Patient is in no pain, and denies dysuria, vaginal bleeding, abdominal cramping, no other complaints at this time.      Physical Exam   Triage Vital Signs: ED Triage Vitals [08/19/22 1614]  Enc Vitals Group     BP (!) 139/102     Pulse Rate 74     Resp 17     Temp 98.3 F (36.8 C)     Temp Source Oral     SpO2 100 %     Weight 198 lb 6.6 oz (90 kg)     Height 5\' 6"  (1.676 m)     Head Circumference      Peak Flow      Pain Score      Pain Loc      Pain Edu?      Excl. in GC?     Most recent vital signs: Vitals:   08/19/22 1614  BP: (!) 139/102  Pulse: 74  Resp: 17  Temp: 98.3 F (36.8 C)  SpO2: 100%    General Awake, no distress.  HEENT NCAT. PERRL. EOMI. No rhinorrhea. Mucous membranes are moist.  CV:  Good peripheral perfusion. RRR RESP:  Normal effort. LCTAB ABD:  No distention. Soft. Nontender     ED Results / Procedures / Treatments   Labs (all labs ordered are listed, but only abnormal results are displayed) Labs Reviewed  HCG, QUANTITATIVE, PREGNANCY  POC URINE PREG, ED   No results found.   PROCEDURES:  Critical Care performed: No  Procedures   MEDICATIONS ORDERED IN ED: Medications - No data to display   IMPRESSION / MDM / ASSESSMENT AND PLAN / ED COURSE  I reviewed the triage vital signs and the nursing notes.                               27 y.o. female presents to the  emergency department for confirmation of possible pregnancy due to late menstrual cycle. See HPI for further details.   hCG is less than 5 indicating no pregnancy at this time.  POC urine pregnant is negative.  Results discussed with patient who verbalized understanding. Patient will be discharged home with instructions to follow-up with primary care physician.  A list of offices was provided. Patient is given ED precautions to return to the ED for any worsening or new symptoms. Patient verbalizes understanding. All questions and concerns were addressed during ED visit.    Patient's presentation is most consistent with acute complicated illness / injury requiring diagnostic workup.  FINAL CLINICAL IMPRESSION(S) / ED DIAGNOSES   Final diagnoses:  Encounter for pregnancy test with result negative     Rx / DC Orders   ED Discharge Orders     None        Note:  This document was prepared using Dragon voice recognition software  and may include unintentional dictation errors.    Romeo Apple, Alivya Wegman A, PA-C 08/19/22 1843    Georga Hacking, MD 08/19/22 [phone]

## 2022-08-19 NOTE — Discharge Instructions (Signed)
Please go to the following website to schedule new (and existing) patient appointments:   [email or web address]   The following is a list of primary care offices in the area who are accepting new patients at this time.  Please reach out to one of them directly and let them know you would like to schedule an appointment to follow up on an Emergency Department visit, and/or to establish a new primary care provider (PCP).  There are likely other primary care clinics in the are who are accepting new patients, but this is an excellent place to start:  [facility] Lead physician: Dr Angela Bacigalupo [address] [place], [place] [address] [phone]  [facility] Lead Physician: Dr Krichna Sowles [address], [place], [place] [address] [phone]  [facility]  Lead Physician: Dr Megan Johnson [address], [place], [place] [address] [phone]  [facility] [place] [facility] Lead Physician: Dr Alex Karamalegos [address], [place], [place] [address] [phone]  [facility] [place] Lead Physician: Dr Laura Berglund [address], [place], [place] [address] [phone]   

## 2022-08-19 NOTE — ED Triage Notes (Signed)
Pt sts that she is wanting her pregnancy confirmed as she is getting different result and she is three weeks late.

## 2022-09-04 ENCOUNTER — Ambulatory Visit (LOCAL_COMMUNITY_HEALTH_CENTER): Payer: Medicaid Other

## 2022-09-04 VITALS — BP 126/86 | Wt 179.0 lb

## 2022-09-04 DIAGNOSIS — Z309 Encounter for contraceptive management, unspecified: Secondary | ICD-10-CM | POA: Diagnosis not present

## 2022-09-04 DIAGNOSIS — Z3202 Encounter for pregnancy test, result negative: Secondary | ICD-10-CM

## 2022-09-04 LAB — PREGNANCY, URINE: Preg Test, Ur: NEGATIVE

## 2022-09-04 NOTE — Progress Notes (Signed)
UPT negative. LMP approximately 07/05/2022. Last sex 08/29/2022. No BCM and not interested in [facility]. Reports not interested in being pregnant. Reports symptoms of pregnancy. RN advised patient to return in 2 weeks for another pregnancy test. Also advised pt to see a provider for her symptoms. Patient has no provider, local list of providers given. No signs of ectopic pregnancy, but advised to seek medical attention if signs do develop.   Negative pregnancy packet reviewed and given to patient.   Patient given the opportunity to ask questions. Questions answered and patient verbalizes understanding.   Abagail Kitchens, RN

## 2022-09-08 DIAGNOSIS — Z7251 High risk heterosexual behavior: Secondary | ICD-10-CM | POA: Diagnosis not present

## 2022-09-26 DIAGNOSIS — R070 Pain in throat: Secondary | ICD-10-CM | POA: Diagnosis not present

## 2022-09-26 DIAGNOSIS — J039 Acute tonsillitis, unspecified: Secondary | ICD-10-CM | POA: Diagnosis not present

## 2022-09-27 ENCOUNTER — Other Ambulatory Visit: Payer: Self-pay

## 2022-09-27 ENCOUNTER — Emergency Department: Payer: Medicaid Other

## 2022-09-27 ENCOUNTER — Inpatient Hospital Stay
Admission: EM | Admit: 2022-09-27 | Discharge: 2022-09-30 | DRG: 872 | Disposition: A | Discharge status: Home / Self Care | Payer: Medicaid Other | Attending: Internal Medicine | Admitting: Internal Medicine

## 2022-09-27 DIAGNOSIS — F32A Depression, unspecified: Secondary | ICD-10-CM | POA: Diagnosis present

## 2022-09-27 DIAGNOSIS — Z808 Family history of malignant neoplasm of other organs or systems: Secondary | ICD-10-CM | POA: Diagnosis not present

## 2022-09-27 DIAGNOSIS — Z818 Family history of other mental and behavioral disorders: Secondary | ICD-10-CM | POA: Diagnosis not present

## 2022-09-27 DIAGNOSIS — Z882 Allergy status to sulfonamides status: Secondary | ICD-10-CM

## 2022-09-27 DIAGNOSIS — Z825 Family history of asthma and other chronic lower respiratory diseases: Secondary | ICD-10-CM | POA: Diagnosis not present

## 2022-09-27 DIAGNOSIS — Z87440 Personal history of urinary (tract) infections: Secondary | ICD-10-CM

## 2022-09-27 DIAGNOSIS — J353 Hypertrophy of tonsils with hypertrophy of adenoids: Secondary | ICD-10-CM | POA: Diagnosis not present

## 2022-09-27 DIAGNOSIS — R590 Localized enlarged lymph nodes: Secondary | ICD-10-CM | POA: Diagnosis present

## 2022-09-27 DIAGNOSIS — Z833 Family history of diabetes mellitus: Secondary | ICD-10-CM | POA: Diagnosis not present

## 2022-09-27 DIAGNOSIS — J36 Peritonsillar abscess: Secondary | ICD-10-CM | POA: Diagnosis not present

## 2022-09-27 DIAGNOSIS — E876 Hypokalemia: Secondary | ICD-10-CM | POA: Diagnosis present

## 2022-09-27 DIAGNOSIS — A419 Sepsis, unspecified organism: Principal | ICD-10-CM

## 2022-09-27 DIAGNOSIS — F909 Attention-deficit hyperactivity disorder, unspecified type: Secondary | ICD-10-CM | POA: Diagnosis present

## 2022-09-27 DIAGNOSIS — Z1152 Encounter for screening for COVID-19: Secondary | ICD-10-CM | POA: Diagnosis not present

## 2022-09-27 DIAGNOSIS — Z8249 Family history of ischemic heart disease and other diseases of the circulatory system: Secondary | ICD-10-CM

## 2022-09-27 DIAGNOSIS — F902 Attention-deficit hyperactivity disorder, combined type: Secondary | ICD-10-CM | POA: Diagnosis not present

## 2022-09-27 DIAGNOSIS — J039 Acute tonsillitis, unspecified: Secondary | ICD-10-CM | POA: Diagnosis not present

## 2022-09-27 LAB — COMPREHENSIVE METABOLIC PANEL
ALT: 12 U/L (ref 0–44)
AST: 15 U/L (ref 15–41)
Albumin: 4.1 g/dL (ref 3.5–5.0)
Alkaline Phosphatase: 69 U/L (ref 38–126)
Anion gap: 12 (ref 5–15)
BUN: 13 mg/dL (ref 6–20)
CO2: 22 mmol/L (ref 22–32)
Calcium: 8.9 mg/dL (ref 8.9–10.3)
Chloride: 102 mmol/L (ref 98–111)
Creatinine, Ser: 0.78 mg/dL (ref 0.44–1.00)
GFR, Estimated: >60 mL/min (ref >60)
Glucose, Bld: 91 mg/dL (ref 70–99)
Potassium: 3.3 mmol/L — ABNORMAL LOW (ref 3.5–5.1)
Sodium: 136 mmol/L (ref 135–145)
Total Bilirubin: 0.7 mg/dL (ref 0.3–1.2)
Total Protein: 8.6 g/dL — ABNORMAL HIGH (ref 6.5–8.1)

## 2022-09-27 LAB — CBC WITH DIFFERENTIAL/PLATELET
Abs Immature Granulocytes: 0.09 10*3/uL — ABNORMAL HIGH (ref 0.00–0.07)
Basophils Absolute: 0 10*3/uL (ref 0.0–0.1)
Basophils Relative: 0 %
Eosinophils Absolute: 0 10*3/uL (ref 0.0–0.5)
Eosinophils Relative: 0 %
HCT: 43.1 % (ref 36.0–46.0)
Hemoglobin: 15 g/dL (ref 12.0–15.0)
Immature Granulocytes: 0 %
Lymphocytes Relative: 8 %
Lymphs Abs: 1.6 10*3/uL (ref 0.7–4.0)
MCH: 30.9 pg (ref 26.0–34.0)
MCHC: 34.8 g/dL (ref 30.0–36.0)
MCV: 88.7 fL (ref 80.0–100.0)
Monocytes Absolute: 1.2 10*3/uL — ABNORMAL HIGH (ref 0.1–1.0)
Monocytes Relative: 6 %
Neutro Abs: 17.2 10*3/uL — ABNORMAL HIGH (ref 1.7–7.7)
Neutrophils Relative %: 86 %
Platelets: 316 10*3/uL (ref 150–400)
RBC: 4.86 MIL/uL (ref 3.87–5.11)
RDW: 11.9 % (ref 11.5–15.5)
WBC: 20.1 10*3/uL — ABNORMAL HIGH (ref 4.0–10.5)
nRBC: 0 % (ref 0.0–0.2)

## 2022-09-27 LAB — MONONUCLEOSIS SCREEN: Mono Screen: NEGATIVE

## 2022-09-27 LAB — LACTIC ACID, PLASMA: Lactic Acid, Venous: 0.9 mmol/L (ref 0.5–1.9)

## 2022-09-27 LAB — GROUP A STREP BY PCR: Group A Strep by PCR: NOT DETECTED

## 2022-09-27 LAB — POC URINE PREG, ED: Preg Test, Ur: NEGATIVE

## 2022-09-27 MED ORDER — IOHEXOL 300 MG/ML  SOLN
75.0000 mL | Freq: Once | INTRAMUSCULAR | Status: AC | PRN
  Administered 2022-09-27: 75 mL via INTRAVENOUS

## 2022-09-27 MED ORDER — ONDANSETRON HCL 4 MG/2ML IJ SOLN: 4.0000 mg | Freq: Four times a day (QID) | INTRAMUSCULAR | Status: DC | PRN

## 2022-09-27 MED ORDER — ENOXAPARIN SODIUM 40 MG/0.4ML IJ SOSY: 40.0000 mg | PREFILLED_SYRINGE | INTRAMUSCULAR | Status: DC

## 2022-09-27 MED ORDER — MORPHINE SULFATE (PF) 2 MG/ML IV SOLN
2.0000 mg | INTRAVENOUS | Status: DC | PRN
  Administered 2022-09-28: 2 mg via INTRAVENOUS
  Filled 2022-09-27: qty 1

## 2022-09-27 MED ORDER — SODIUM CHLORIDE 0.9 % IV BOLUS
1000.0000 mL | Freq: Once | INTRAVENOUS | Status: AC
  Administered 2022-09-28: 1000 mL via INTRAVENOUS

## 2022-09-27 MED ORDER — ACETAMINOPHEN 650 MG RE SUPP: 650.0000 mg | Freq: Four times a day (QID) | RECTAL | Status: DC | PRN

## 2022-09-27 MED ORDER — SODIUM CHLORIDE 0.9 % IV SOLN: 2.0000 g | INTRAVENOUS | Status: DC

## 2022-09-27 MED ORDER — ONDANSETRON HCL 4 MG PO TABS: 4.0000 mg | ORAL_TABLET | Freq: Four times a day (QID) | ORAL | Status: DC | PRN

## 2022-09-27 MED ORDER — DEXAMETHASONE SODIUM PHOSPHATE 10 MG/ML IJ SOLN
10.0000 mg | Freq: Once | INTRAMUSCULAR | Status: AC
  Administered 2022-09-27: 10 mg via INTRAVENOUS
  Filled 2022-09-27: qty 1

## 2022-09-27 MED ORDER — ENOXAPARIN SODIUM 40 MG/0.4ML IJ SOSY
40.0000 mg | PREFILLED_SYRINGE | INTRAMUSCULAR | Status: DC
  Administered 2022-09-28 – 2022-09-30 (×3): 40 mg via SUBCUTANEOUS
  Filled 2022-09-27 (×3): qty 0.4

## 2022-09-27 MED ORDER — SODIUM CHLORIDE 0.9 % IV SOLN
3.0000 g | Freq: Once | INTRAVENOUS | Status: AC
  Administered 2022-09-27: 3 g via INTRAVENOUS
  Filled 2022-09-27: qty 8

## 2022-09-27 MED ORDER — SODIUM CHLORIDE 0.9 % IV BOLUS
1000.0000 mL | Freq: Once | INTRAVENOUS | Status: AC
  Administered 2022-09-27: 1000 mL via INTRAVENOUS

## 2022-09-27 MED ORDER — SODIUM CHLORIDE 0.9 % IV SOLN
3.0000 g | Freq: Four times a day (QID) | INTRAVENOUS | Status: DC
  Administered 2022-09-28 – 2022-09-30 (×10): 3 g via INTRAVENOUS
  Filled 2022-09-27 (×12): qty 8

## 2022-09-27 MED ORDER — MAGNESIUM HYDROXIDE 400 MG/5ML PO SUSP: 30.0000 mL | Freq: Every day | ORAL | Status: DC | PRN

## 2022-09-27 MED ORDER — ACETAMINOPHEN 325 MG PO TABS: 650.0000 mg | ORAL_TABLET | Freq: Four times a day (QID) | ORAL | Status: DC | PRN

## 2022-09-27 MED ORDER — MORPHINE SULFATE (PF) 2 MG/ML IV SOLN
2.0000 mg | Freq: Once | INTRAVENOUS | Status: AC
  Administered 2022-09-27: 2 mg via INTRAVENOUS
  Filled 2022-09-27: qty 1

## 2022-09-27 MED ORDER — MORPHINE SULFATE (PF) 4 MG/ML IV SOLN: 4.0000 mg | Freq: Once | INTRAVENOUS | Status: DC

## 2022-09-27 MED ORDER — TRAZODONE HCL 50 MG PO TABS: 25.0000 mg | ORAL_TABLET | Freq: Every evening | ORAL | Status: DC | PRN

## 2022-09-27 NOTE — Assessment & Plan Note (Signed)
-   She stated that this has been under control without medications lately.

## 2022-09-27 NOTE — Assessment & Plan Note (Signed)
-   The patient will be admitted to a medical telemetry bed. - Will continue antibiotic therapy with IV Unasyn. - ENT consult to be obtained. - Dr. Juliane Poot was notified and is aware about the patient. - We will follow blood cultures. - She will be kept n.p.o. after midnight.

## 2022-09-27 NOTE — H&P (Signed)
[place]   PATIENT NAME: Colleen Chapman    MR#:  [id number]  DATE OF BIRTH:  1996-02-10  DATE OF ADMISSION:  09/27/2022  PRIMARY CARE PHYSICIAN: Pcp, No   Patient is coming from: Home  REQUESTING/REFERRING PHYSICIAN: Cameron Ali, PA-C   CHIEF COMPLAINT:   Chief Complaint  Patient presents with   Sore Throat    HISTORY OF PRESENT ILLNESS:  Jolisa Skrocki is a 27 y.o. female with medical history significant for ADHD and depression, on no medications, who presented to the emergency room with the onset of sore throat mainly in the right side that started on Tuesday and got significantly worse on Wednesday.  Thursday she was not able to go to work and went to urgent care.  She had a negative rapid strep test done.  She was prescribed p.o. prednisone and amoxicillin.  It got slightly better after that however after going to sleep.  Today she woke up not able to breathe with with increased right-sided throat swelling.  She denies any fever or chills.  She admitted to body aches few days ago.  She has been having diminished taste as well as right ear ache with no rhinorrhea or nasal congestion.  No chest pain or dyspnea however she has been having cough earlier is mainly dry without wheezing.  ED Course: When the patient came to the ER, BP was 140/85 with otherwise normal vital signs.  Heart rate around 92.  Labs revealed mild hypokalemia of 3.3 with otherwise normal CMP.  He showed significant leukocytosis 20.1 with neutrophilia.  Urine (negative.  Mono spot screen test came back negative.  Respiratory panel is currently pending. EKG as reviewed by me : None Imaging: CT soft tissue neck with contrast revealed acute tonsillopharyngitis with 2.6 X1.9X 3.1 cm right palatine peritonsillar abscess and small amount of right asymmetric retropharyngeal fluid, likely reactive with no retropharyngeal abscess.  It also showed reactive cervical adenopathy.  The patient was  given 2 L bolus of IV normal saline, IV Unasyn, 10 mg of IV Decadron, 2 mg of IV morphine sulfate.  Dr. Azucena Fallen was notified about the patient and is aware.  She will be admitted to a medical telemetry bed for further evaluation and management. PAST MEDICAL HISTORY:   Past Medical History:  Diagnosis Date   Abnormal Pap smear of cervix 09/11/2020   ADHD (attention deficit hyperactivity disorder)    ongoing   Depression    "Because I was forced to have an abortion"   History of urinary tract infection    Pregnancy induced hypertension    Preeclampsia with 2017 delivery    PAST SURGICAL HISTORY:   Past Surgical History:  Procedure Laterality Date   EAB  01/2018   NO PAST SURGERIES      SOCIAL HISTORY:   Social History   Tobacco Use   Smoking status: Never   Smokeless tobacco: Never  Substance Use Topics   Alcohol use: Not Currently    Alcohol/week: 1.0 standard drink of alcohol    Types: 1 Standard drinks or equivalent per week    Comment: last use 2022 can't remember when    FAMILY HISTORY:   Family History  Problem Relation Age of Onset   Diabetes Paternal Grandfather    Cancer Paternal Grandmother        Skin Ca   Diabetes Father    Hypertension Father    Asthma Father    Bipolar disorder Mother  Anemia Mother    Deafness Mother    Birth defects Mother        born without hand    DRUG ALLERGIES:   Allergies  Allergen Reactions   Bactrim [Sulfamethoxazole-Trimethoprim] Rash    REVIEW OF SYSTEMS:   ROS As per history of present illness. All pertinent systems were reviewed above. Constitutional, HEENT, cardiovascular, respiratory, GI, GU, musculoskeletal, neuro, psychiatric, endocrine, integumentary and hematologic systems were reviewed and are otherwise negative/unremarkable except for positive findings mentioned above in the HPI.   MEDICATIONS AT HOME:   Prior to Admission medications   Not on File      VITAL SIGNS:  Blood pressure 127/86,  pulse 87, temperature 98.9 F (37.2 C), temperature source Oral, resp. rate 16, height 5\' 6"  (1.676 m), SpO2 99%.  PHYSICAL EXAMINATION:  Physical Exam  GENERAL:  27 y.o.-year-old female patient lying in the bed with no acute distress.  EYES: Pupils equal, round, reactive to light and accommodation. No scleral icterus. Extraocular muscles intact.  HEENT: Head atraumatic, normocephalic. Oropharynx with dried large peritonsillar swelling with erythema and white exudate and uvular deviation to the left and nasopharynx clear.  NECK:  Supple, no jugular venous distention with the right tonsillar shotty tender lymphadenopathy. No thyroid enlargement, no tenderness.  LUNGS: Normal breath sounds bilaterally, no wheezing, rales,rhonchi or crepitation. No use of accessory muscles of respiration.  CARDIOVASCULAR: Regular rate and rhythm, S1, S2 normal. No murmurs, rubs, or gallops.  ABDOMEN: Soft, nondistended, nontender. Bowel sounds present. No organomegaly or mass.  EXTREMITIES: No pedal edema, cyanosis, or clubbing.  NEUROLOGIC: Cranial nerves II through XII are intact. Muscle strength 5/5 in all extremities. Sensation intact. Gait not checked.  PSYCHIATRIC: The patient is alert and oriented x 3.  Normal affect and good eye contact. SKIN: No obvious rash, lesion, or ulcer.      LABORATORY PANEL:   CBC Recent Labs  Lab 09/27/22 2047  WBC 20.1*  HGB 15.0  HCT 43.1  PLT 316   ------------------------------------------------------------------------------------------------------------------  Chemistries  Recent Labs  Lab 09/27/22 2047  NA 136  K 3.3*  CL 102  CO2 22  GLUCOSE 91  BUN 13  CREATININE 0.78  CALCIUM 8.9  AST 15  ALT 12  ALKPHOS 69  BILITOT 0.7   ------------------------------------------------------------------------------------------------------------------  Cardiac Enzymes No results for input(s): "TROPONINI" in the last 168  hours. ------------------------------------------------------------------------------------------------------------------  RADIOLOGY:  CT Soft Tissue Neck W Contrast  Result Date: 09/27/2022 CLINICAL DATA:  Tonsillitis EXAM: CT NECK WITH CONTRAST TECHNIQUE: Multidetector CT imaging of the neck was performed using the standard protocol following the bolus administration of intravenous contrast. RADIATION DOSE REDUCTION: This exam was performed according to the departmental dose-optimization program which includes automated exposure control, adjustment of the mA and/or kV according to patient size and/or use of iterative reconstruction technique. CONTRAST:  75mL OMNIPAQUE IOHEXOL 300 MG/ML  SOLN COMPARISON:  None Available. FINDINGS: Pharynx and larynx: The palatine and adenoid tonsils are enlarged. There is a 2.6 x 1.9 x 3.1 cm right palatine peritonsillar abscess. Small amount of right asymmetric right retropharyngeal fluid. No retropharyngeal abscess. The epiglottis and larynx are normal. Salivary glands: No inflammation, mass, or stone. Thyroid: Normal. Lymph nodes: Bilateral reactive cervical lymph nodes. Vascular: Negative. Limited intracranial: Negative. Visualized orbits: Negative. Mastoids and visualized paranasal sinuses: Clear. Skeleton: No acute or aggressive process. Reversal of normal cervical lordosis. Upper chest: Negative. Other: None. IMPRESSION: 1. Acute tonsillopharyngitis with 2.6 x 1.9 x 3.1 cm right palatine peritonsillar abscess.  2. Small amount of right asymmetric retropharyngeal fluid, likely reactive. No retropharyngeal abscess. 3. Reactive cervical adenopathy. Electronically Signed   By: Deatra Robinson M.D.   On: 09/27/2022 22:27      IMPRESSION AND PLAN:  Assessment and Plan: * Peritonsillar abscess - The patient will be admitted to a medical telemetry bed. - Will continue antibiotic therapy with IV Unasyn. - ENT consult to be obtained. - Dr. Juliane Poot was notified and is  aware about the patient. - We will follow blood cultures. - She will be kept n.p.o. after midnight.  Sepsis due to undetermined organism [facility]) - This is clearly secondary to #1 and is manifested by heart rate of 92 and leukocytosis. - Management as above. - We will continue hydration with IV normal saline. - We will follow blood cultures.  ADHD She has not had medications for it for a long time.  Depression - She stated that this has been under control without medications lately.   DVT prophylaxis: Lovenox.  Advanced Care Planning:  Code Status: full code.  Family Communication:  The plan of care was discussed in details with the patient (and family). I answered all questions. The patient agreed to proceed with the above mentioned plan. Further management will depend upon hospital course. Disposition Plan: Back to previous home environment Consults called: ENT. All the records are reviewed and case discussed with ED provider.  Status is: Inpatient  At the time of the admission, it appears that the appropriate admission status for this patient is inpatient.  This is judged to be reasonable and necessary in order to provide the required intensity of service to ensure the patient's safety given the presenting symptoms, physical exam findings and initial radiographic and laboratory data in the context of comorbid conditions.  The patient requires inpatient status due to high intensity of service, high risk of further deterioration and high frequency of surveillance required.  I certify that at the time of admission, it is my clinical judgment that the patient will require inpatient hospital care extending more than 2 midnights.                            Dispo: The patient is from: Home              Anticipated d/c is to: Home              Patient currently is not medically stable to d/c.              Difficult to place patient: No  Hannah Beat M.D on 09/27/2022 at 11:52 PM  [facility]   From 7 PM-7 AM, contact night-coverage [email or web address]  CC: Primary care physician; Pcp, No

## 2022-09-27 NOTE — ED Provider Notes (Signed)
[facility] Provider Note    Event Date/Time   First MD Initiated Contact with Patient 09/27/22 1933     (approximate)   History   Sore Throat   HPI  Colleen Chapman is a 27 y.o. female with no PMH who presents for evaluation of a sore throat x 3 days.  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  Her symptoms progressed and she was seen by urgent care on Thursday, she had a negative strep test, was prescribed amoxicillin and prednisone.  She came to the ED today because she felt like her swelling was getting worse and it was beginning to close her airway.  She reports voice hoarseness and difficulty swallowing.  She denies other symptoms like cough and fever.     Physical Exam   Triage Vital Signs: ED Triage Vitals  Encounter Vitals Group     BP 09/27/22 1926 (!) 140/85     Systolic BP Percentile --      Diastolic BP Percentile --      Pulse Rate 09/27/22 1926 92     Resp 09/27/22 1926 16     Temp 09/27/22 1926 98 F (36.7 C)     Temp Source 09/27/22 1926 Oral     SpO2 09/27/22 1926 100 %     Weight --      Height 09/27/22 1927 5\' 6"  (1.676 m)     Head Circumference --      Peak Flow --      Pain Score 09/27/22 1926 6     Pain Loc --      Pain Education --      Exclude from Growth Chart --     Most recent vital signs: Vitals:   09/27/22 1926  BP: (!) 140/85  Pulse: 92  Resp: 16  Temp: 98 F (36.7 C)  SpO2: 100%    General: Awake, no distress.  CV:  Good peripheral perfusion. RRR. Resp:  Normal effort. CTAB. Hoarse voice.  Abd:  No distention.  Other:  Oral mucosa moist, right tonsil erythematous and swollen with uvular deviation, no exudates, halitosis, tender submandibular and sublingual lymphadenopathy, no trismus   ED Results / Procedures / Treatments   Labs (all labs ordered are listed, but only abnormal results are displayed) Labs Reviewed   COMPREHENSIVE METABOLIC PANEL - Abnormal; Notable for the following components:      Result Value   Potassium 3.3 (*)    Total Protein 8.6 (*)    All other components within normal limits  CBC WITH DIFFERENTIAL/PLATELET - Abnormal; Notable for the following components:   WBC 20.1 (*)    Neutro Abs 17.2 (*)    Monocytes Absolute 1.2 (*)    Abs Immature Granulocytes 0.09 (*)    All other components within normal limits  GROUP A STREP BY PCR  SARS CORONAVIRUS 2 BY RT PCR  MONONUCLEOSIS SCREEN  LACTIC ACID, PLASMA  LACTIC ACID, PLASMA  POC URINE PREG, ED     RADIOLOGY  CT soft tissue neck with contrast obtained in the ED, I interpreted the images and reviewed the radiologist report.    PROCEDURES:  Critical Care performed: No  Procedures   MEDICATIONS ORDERED IN ED: Medications  sodium chloride 0.9 % bolus 1,000 mL (has no administration in time range)  sodium chloride 0.9 % bolus 1,000 mL (1,000 mLs Intravenous New Bag/Given 09/27/22 2017)  dexamethasone (DECADRON) injection 10 mg (10 mg Intravenous Given 09/27/22 2011)  Ampicillin-Sulbactam (UNASYN) 3 g in sodium chloride 0.9 % 100 mL IVPB (0 g Intravenous Stopped 09/27/22 2050)  iohexol (OMNIPAQUE) 300 MG/ML solution 75 mL (75 mLs Intravenous Contrast Given 09/27/22 2124)  morphine (PF) 2 MG/ML injection 2 mg (2 mg Intravenous Given 09/27/22 2303)     IMPRESSION / MDM / ASSESSMENT AND PLAN / ED COURSE  I reviewed the triage vital signs and the nursing notes.                             27 year old female presents for evaluation of a sore throat.  VSS in triage aside from mildly elevated BP.  Patient is nontoxic-appearing, NAD.  Differential diagnosis includes, but is not limited to, strep throat, viral pharyngitis, mononucleosis, peritonsillar abscess, retropharyngeal abscess.  Patient's presentation is most consistent with acute complicated illness / injury requiring diagnostic workup.  Strep and mono test were both  negative.  CBC notable for an elevated white count and increased neutrophils.  CMP unremarkable.    CT soft tissue of the neck obtained to evaluate for possible peritonsillar abscess.  I interpreted the images as well as reviewed the radiologist report, which showed acute tonsillopharyngitis with a 2.6 x 1.9 x 3.1 cm right palatine peritonsillar abscess, small amount of right asymmetric retropharyngeal fluid and reactive cervical adenopathy.  I spoke with the on-call ENT doctor who would like to admit the patient for IV antibiotics overnight and agreed to see her in the morning to evaluate whether an I&D is necessary.  I spoke with the hospitalist who is agreeable to admission.  COVID test and lactic acid ordered at the hospitalist's request.  I was still waiting for the results at the time of patient's admission.  Patient was given 3 g of Unasyn and 10 mg of Decadron while in the ED today.  She reported a slight improvement in her symptoms however she is still having pain and difficulty swallowing.  She was given 2 mg of morphine to address her pain.  I explained this to the patient, she is agreeable to plan. Patient was stable at the time of admission.     FINAL CLINICAL IMPRESSION(S) / ED DIAGNOSES   Final diagnoses:  Peritonsillar abscess     Rx / DC Orders   ED Discharge Orders     None        Note:  This document was prepared using Dragon voice recognition software and may include unintentional dictation errors.   Cameron Ali, PA-C 09/27/22 2320    Sharman Cheek, MD 09/28/22 0040

## 2022-09-27 NOTE — ED Triage Notes (Signed)
Pt to ed from home via POV for sore throat. Pt was seen at [facility] yesterday for swollen tonsils. Pt tested NEG for strep and was prescribed abx and prednisone. Pt is caox4, in no acute distress and ambulatory in triage.

## 2022-09-27 NOTE — Assessment & Plan Note (Signed)
She has not had medications for it for a long time.

## 2022-09-27 NOTE — Assessment & Plan Note (Addendum)
-   This is clearly secondary to #1 and is manifested by heart rate of 92 and leukocytosis. - Management as above. - We will continue hydration with IV normal saline. - We will follow blood cultures.

## 2022-09-28 DIAGNOSIS — J039 Acute tonsillitis, unspecified: Secondary | ICD-10-CM | POA: Diagnosis not present

## 2022-09-28 DIAGNOSIS — J36 Peritonsillar abscess: Secondary | ICD-10-CM | POA: Diagnosis not present

## 2022-09-28 LAB — HIV ANTIBODY (ROUTINE TESTING W REFLEX): HIV Screen 4th Generation wRfx: NONREACTIVE

## 2022-09-28 MED ORDER — LIDOCAINE-EPINEPHRINE 1 %-1:100000 IJ SOLN
20.0000 mL | INTRAMUSCULAR | Status: AC
  Administered 2022-09-28: 20 mL
  Filled 2022-09-28: qty 20

## 2022-09-28 MED ORDER — MORPHINE SULFATE (PF) 2 MG/ML IV SOLN
2.0000 mg | INTRAVENOUS | Status: DC | PRN
  Administered 2022-09-28: 2 mg via INTRAVENOUS
  Filled 2022-09-28: qty 1

## 2022-09-28 MED ORDER — OXYCODONE-ACETAMINOPHEN 5-325 MG PO TABS
1.0000 | ORAL_TABLET | Freq: Four times a day (QID) | ORAL | Status: DC | PRN
  Administered 2022-09-28 – 2022-09-29 (×2): 1 via ORAL
  Administered 2022-09-29 – 2022-09-30 (×2): 2 via ORAL
  Filled 2022-09-28 (×2): qty 1
  Filled 2022-09-28 (×2): qty 2
  Filled 2022-09-28: qty 1

## 2022-09-28 NOTE — Progress Notes (Signed)
PROGRESS NOTE    Colleen Chapman  OZH:[id number] DOB: Apr 26, 1995 DOA: 09/27/2022 PCP: Pcp, No   Assessment & Plan:   Principal Problem:   Peritonsillar abscess Active Problems:   Sepsis due to undetermined organism [facility])   Depression   ADHD  Assessment and Plan: Peritonsillar abscess: continue on IV unasyn and IVFs. S/p drainage of peritonsillar abscess by ENT. Peritonsillar cx is pending. Blood cxs are pending    Sepsis: met criteria w/ tachycardia, leukocytosis & pperitonsillar abscess. Continue on IV unasyn and IVF. Blood cxs are pending. Sepsis resolved    ADHD: not taking any meds for this currently    Depression: severity unknown. Not on medications currently       DVT prophylaxis: lovenox  Code Status: full  Family Communication: Disposition Plan: d/c back home   Level of care: Telemetry Medical Status is: Inpatient Remains inpatient appropriate because: requiring IV abxs      Consultants:  ENT  Procedures:  Antimicrobials: unasyn   Subjective: Pt c/o bad taste in her mouth.   Objective: Vitals:   09/27/22 2349 09/28/22 0001 09/28/22 0136 09/28/22 0342  BP: 127/86 129/88 122/79 121/75  Pulse: 87 85 81 81  Resp: 16 16 18 16   Temp: 98.9 F (37.2 C) 97.9 F (36.6 C) 98.4 F (36.9 C) 97.9 F (36.6 C)  TempSrc: Oral Oral Oral Oral  SpO2: 99% 98% 99% 98%  Weight:  78.9 kg    Height:  5\' 6"  (1.676 m)      Intake/Output Summary (Last 24 hours) at 09/28/2022 0759 Last data filed at 09/28/2022 0745 Gross per 24 hour  Intake 2101.83 ml  Output 1000 ml  Net 1101.83 ml   Filed Weights   09/28/22 0001  Weight: 78.9 kg    Examination:  General exam: Appears calm and comfortable  Respiratory system: Clear to auscultation. Respiratory effort normal. Cardiovascular system: S1 & S2+. No rubs, gallops or clicks. Gastrointestinal system: Abdomen is nondistended, soft and nontender. Normal bowel sounds heard. Central nervous system: Alert and  oriented. Moves all extremities  Psychiatry: Judgement and insight appear normal. Mood & affect appropriate.     Data Reviewed: I have personally reviewed following labs and imaging studies  CBC: Recent Labs  Lab 09/27/22 2047 09/28/22 0348  WBC 20.1* 19.3*  NEUTROABS 17.2*  --   HGB 15.0 13.4  HCT 43.1 38.0  MCV 88.7 87.6  PLT 316 282   Basic Metabolic Panel: Recent Labs  Lab 09/27/22 2047 09/28/22 0348  NA 136 135  K 3.3* 3.7  CL 102 104  CO2 22 22  GLUCOSE 91 132*  BUN 13 9  CREATININE 0.78 0.58  CALCIUM 8.9 8.4*   GFR: Estimated Creatinine Clearance: 111.9 mL/min (by C-G formula based on SCr of 0.58 mg/dL). Liver Function Tests: Recent Labs  Lab 09/27/22 2047  AST 15  ALT 12  ALKPHOS 69  BILITOT 0.7  PROT 8.6*  ALBUMIN 4.1   No results for input(s): "LIPASE", "AMYLASE" in the last 168 hours. No results for input(s): "AMMONIA" in the last 168 hours. Coagulation Profile: Recent Labs  Lab 09/28/22 0348  INR 1.1   Cardiac Enzymes: No results for input(s): "CKTOTAL", "CKMB", "CKMBINDEX", "TROPONINI" in the last 168 hours. BNP (last 3 results) No results for input(s): "PROBNP" in the last 8760 hours. HbA1C: No results for input(s): "HGBA1C" in the last 72 hours. CBG: No results for input(s): "GLUCAP" in the last 168 hours. Lipid Profile: No results for input(s): "CHOL", "HDL", "  LDLCALC", "TRIG", "CHOLHDL", "LDLDIRECT" in the last 72 hours. Thyroid Function Tests: No results for input(s): "TSH", "T4TOTAL", "FREET4", "T3FREE", "THYROIDAB" in the last 72 hours. Anemia Panel: No results for input(s): "VITAMINB12", "FOLATE", "FERRITIN", "TIBC", "IRON", "RETICCTPCT" in the last 72 hours. Sepsis Labs: Recent Labs  Lab 09/27/22 2321 09/28/22 0107 09/28/22 0348  PROCALCITON  --   --  <0.10  LATICACIDVEN 0.9 1.1  --     Recent Results (from the past 240 hour(s))  Group A Strep by PCR ([facility] Only)     Status: None   Collection Time: 09/27/22  7:51 PM    Specimen: Throat; Sterile Swab  Result Value Ref Range Status   Group A Strep by PCR NOT DETECTED NOT DETECTED Final    Comment: Performed at [facility], [address] [place], [place] [address]  Resp panel by RT-PCR (RSV, Flu A&B, Covid) Anterior Nasal Swab     Status: None   Collection Time: 09/27/22 11:28 PM   Specimen: Anterior Nasal Swab  Result Value Ref Range Status   SARS Coronavirus 2 by RT PCR NEGATIVE NEGATIVE Final    Comment: (NOTE) SARS-CoV-2 target nucleic acids are NOT DETECTED.  The SARS-CoV-2 RNA is generally detectable in upper respiratory specimens during the acute phase of infection. The lowest concentration of SARS-CoV-2 viral copies this assay can detect is 138 copies/mL. A negative result does not preclude SARS-Cov-2 infection and should not be used as the sole basis for treatment or other patient management decisions. A negative result may occur with  improper specimen collection/handling, submission of specimen other than nasopharyngeal swab, presence of viral mutation(s) within the areas targeted by this assay, and inadequate number of viral copies(<138 copies/mL). A negative result must be combined with clinical observations, patient history, and epidemiological information. The expected result is Negative.  Fact Sheet for Patients:  BloggerCourse.com  Fact Sheet for Healthcare Providers:  SeriousBroker.it  This test is no t yet approved or cleared by the [place] FDA and  has been authorized for detection and/or diagnosis of SARS-CoV-2 by FDA under an Emergency Use Authorization (EUA). This EUA will remain  in effect (meaning this test can be used) for the duration of the COVID-19 declaration under Section 564(b)(1) of the Act, 21 U.S.C.section 360bbb-3(b)(1), unless the authorization is terminated  or revoked sooner.       Influenza A by PCR NEGATIVE NEGATIVE Final    Influenza B by PCR NEGATIVE NEGATIVE Final    Comment: (NOTE) The Xpert Xpress SARS-CoV-2/FLU/RSV plus assay is intended as an aid in the diagnosis of influenza from Nasopharyngeal swab specimens and should not be used as a sole basis for treatment. Nasal washings and aspirates are unacceptable for Xpert Xpress SARS-CoV-2/FLU/RSV testing.  Fact Sheet for Patients: BloggerCourse.com  Fact Sheet for Healthcare Providers: SeriousBroker.it  This test is not yet approved or cleared by the [place] FDA and has been authorized for detection and/or diagnosis of SARS-CoV-2 by FDA under an Emergency Use Authorization (EUA). This EUA will remain in effect (meaning this test can be used) for the duration of the COVID-19 declaration under Section 564(b)(1) of the Act, 21 U.S.C. section 360bbb-3(b)(1), unless the authorization is terminated or revoked.     Resp Syncytial Virus by PCR NEGATIVE NEGATIVE Final    Comment: (NOTE) Fact Sheet for Patients: BloggerCourse.com  Fact Sheet for Healthcare Providers: SeriousBroker.it  This test is not yet approved or cleared by the [place] FDA and has been authorized for detection  and/or diagnosis of SARS-CoV-2 by FDA under an Emergency Use Authorization (EUA). This EUA will remain in effect (meaning this test can be used) for the duration of the COVID-19 declaration under Section 564(b)(1) of the Act, 21 U.S.C. section 360bbb-3(b)(1), unless the authorization is terminated or revoked.  Performed at [facility], [address] [place], [place] [address]          Radiology Studies: CT Soft Tissue Neck W Contrast  Result Date: 09/27/2022 CLINICAL DATA:  Tonsillitis EXAM: CT NECK WITH CONTRAST TECHNIQUE: Multidetector CT imaging of the neck was performed using the standard protocol following the bolus administration of  intravenous contrast. RADIATION DOSE REDUCTION: This exam was performed according to the departmental dose-optimization program which includes automated exposure control, adjustment of the mA and/or kV according to patient size and/or use of iterative reconstruction technique. CONTRAST:  75mL OMNIPAQUE IOHEXOL 300 MG/ML  SOLN COMPARISON:  None Available. FINDINGS: Pharynx and larynx: The palatine and adenoid tonsils are enlarged. There is a 2.6 x 1.9 x 3.1 cm right palatine peritonsillar abscess. Small amount of right asymmetric right retropharyngeal fluid. No retropharyngeal abscess. The epiglottis and larynx are normal. Salivary glands: No inflammation, mass, or stone. Thyroid: Normal. Lymph nodes: Bilateral reactive cervical lymph nodes. Vascular: Negative. Limited intracranial: Negative. Visualized orbits: Negative. Mastoids and visualized paranasal sinuses: Clear. Skeleton: No acute or aggressive process. Reversal of normal cervical lordosis. Upper chest: Negative. Other: None. IMPRESSION: 1. Acute tonsillopharyngitis with 2.6 x 1.9 x 3.1 cm right palatine peritonsillar abscess. 2. Small amount of right asymmetric retropharyngeal fluid, likely reactive. No retropharyngeal abscess. 3. Reactive cervical adenopathy. Electronically Signed   By: Deatra Robinson M.D.   On: 09/27/2022 22:27        Scheduled Meds:  enoxaparin (LOVENOX) injection  40 mg Subcutaneous Q24H   lidocaine-EPINEPHrine  20 mL Other STAT   Continuous Infusions:  ampicillin-sulbactam (UNASYN) IV Stopped (09/28/22 0200)     LOS: 1 day    Time spent: 35 mins    Charise Killian, MD [facility] Pager [phone]  If 7PM-7AM, please contact night-coverage [email or web address] 09/28/2022, 7:59 AM
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
## 2022-09-28 NOTE — Progress Notes (Signed)
Dr. Mayford Knife gave order that patient may shower.

## 2022-09-29 DIAGNOSIS — J36 Peritonsillar abscess: Secondary | ICD-10-CM | POA: Diagnosis not present

## 2022-09-29 LAB — BASIC METABOLIC PANEL WITH GFR
Anion gap: 8 (ref 5–15)
BUN: 13 mg/dL (ref 6–20)
CO2: 26 mmol/L (ref 22–32)
Calcium: 8.7 mg/dL — ABNORMAL LOW (ref 8.9–10.3)
Chloride: 104 mmol/L (ref 98–111)
Creatinine, Ser: 0.67 mg/dL (ref 0.44–1.00)
GFR, Estimated: >60 mL/min (ref >60)
Glucose, Bld: 93 mg/dL (ref 70–99)
Potassium: 3.4 mmol/L — ABNORMAL LOW (ref 3.5–5.1)
Sodium: 138 mmol/L (ref 135–145)

## 2022-09-29 LAB — CBC
HCT: 36.8 % (ref 36.0–46.0)
Hemoglobin: 12.6 g/dL (ref 12.0–15.0)
MCH: 30.4 pg (ref 26.0–34.0)
MCHC: 34.2 g/dL (ref 30.0–36.0)
MCV: 88.9 fL (ref 80.0–100.0)
Platelets: 280 10*3/uL (ref 150–400)
RBC: 4.14 MIL/uL (ref 3.87–5.11)
RDW: 12 % (ref 11.5–15.5)
WBC: 11.2 10*3/uL — ABNORMAL HIGH (ref 4.0–10.5)
nRBC: 0 % (ref 0.0–0.2)

## 2022-09-29 MED ORDER — POTASSIUM CHLORIDE CRYS ER 20 MEQ PO TBCR
20.0000 meq | EXTENDED_RELEASE_TABLET | Freq: Once | ORAL | Status: AC
  Administered 2022-09-29: 20 meq via ORAL
  Filled 2022-09-29: qty 1

## 2022-09-29 MED ORDER — SODIUM CHLORIDE 0.9 % IV SOLN: INTRAVENOUS | Status: DC | PRN

## 2022-09-29 NOTE — TOC CM/SW Note (Signed)
Transition of Care [facility]) - Inpatient Brief Assessment   Patient Details  Name: Colleen Chapman MRN: [id number] Date of Birth: [birth year]  [facility] CM/SW Contact:    Liliana Cline, LCSW Phone Number: 09/29/2022, 9:04 AM   Clinical Narrative:    Transition of Care Asessment: Insurance and Status: Insurance coverage has been reviewed Patient has primary care physician: Yes (assigned through Medicaid)     Prior/Current Home Services: No current home services Social Determinants of Health Reivew: SDOH reviewed no interventions necessary Readmission risk has been reviewed: Yes Transition of care needs: no transition of care needs at this time

## 2022-09-29 NOTE — Progress Notes (Signed)
PROGRESS NOTE    Colleen Chapman  WGN:[id number] DOB: [birth year] DOA: 09/27/2022 PCP: Pcp, No   Assessment & Plan:   Principal Problem:   Peritonsillar abscess Active Problems:   Sepsis due to undetermined organism [facility])   Depression   ADHD  Assessment and Plan: Peritonsillar abscess: continue on IV unasyn. D/c IVFs. S/p drainage of peritonsillar abscess by ENT.  Peritonsillar cx growing gram positive cocci, sens pending. Blood cxs NGTD   Sepsis: met criteria w/ tachycardia, leukocytosis & pperitonsillar abscess. Continue on IV unasyn and IVF. Blood cxs are pending. Sepsis resolved    Hypokalemia: potassium given   ADHD: not taking any meds for this as per med rec    Depression: severity unknown. Not on any meds for this as per med rec       DVT prophylaxis: lovenox  Code Status: full  Family Communication: Disposition Plan: d/c back home   Level of care: Telemetry Medical Status is: Inpatient Remains inpatient appropriate because: requiring IV abxs & cx is still pending      Consultants:  ENT  Procedures:  Antimicrobials: unasyn   Subjective: Pt c/o sore throat   Objective: Vitals:   09/28/22 1200 09/28/22 1507 09/28/22 1939 09/29/22 0424  BP:  125/75 119/76 123/72  Pulse:  75 82 77  Resp: 15 16 16 16   Temp:  97.9 F (36.6 C) 98 F (36.7 C) 97.8 F (36.6 C)  TempSrc:  Oral Oral Oral  SpO2:  97% 100% 99%  Weight:      Height:        Intake/Output Summary (Last 24 hours) at 09/29/2022 0714 Last data filed at 09/29/2022 0454 Gross per 24 hour  Intake 320.17 ml  Output 1000 ml  Net -679.83 ml   Filed Weights   09/28/22 0001  Weight: 78.9 kg    Examination:  General exam: appears calm but uncomfortable  Respiratory system: clear breath sounds b/l  Cardiovascular system: S1/S2+. No rubs or clicks  Gastrointestinal system: Abd is soft, NT, ND & normal bowel sounds  Central nervous system: alert and oriented. Moves all extremities   Psychiatry: Judgement and insight appears normal.  Appropriate mood and affect      Data Reviewed: I have personally reviewed following labs and imaging studies  CBC: Recent Labs  Lab 09/27/22 2047 09/28/22 0348 09/29/22 0355  WBC 20.1* 19.3* 11.2*  NEUTROABS 17.2*  --   --   HGB 15.0 13.4 12.6  HCT 43.1 38.0 36.8  MCV 88.7 87.6 88.9  PLT 316 282 280   Basic Metabolic Panel: Recent Labs  Lab 09/27/22 2047 09/28/22 0348 09/29/22 0355  NA 136 135 138  K 3.3* 3.7 3.4*  CL 102 104 104  CO2 22 22 26   GLUCOSE 91 132* 93  BUN 13 9 13   CREATININE 0.78 0.58 0.67  CALCIUM 8.9 8.4* 8.7*   GFR: Estimated Creatinine Clearance: 111.9 mL/min (by C-G formula based on SCr of 0.67 mg/dL). Liver Function Tests: Recent Labs  Lab 09/27/22 2047  AST 15  ALT 12  ALKPHOS 69  BILITOT 0.7  PROT 8.6*  ALBUMIN 4.1   No results for input(s): "LIPASE", "AMYLASE" in the last 168 hours. No results for input(s): "AMMONIA" in the last 168 hours. Coagulation Profile: Recent Labs  Lab 09/28/22 0348  INR 1.1   Cardiac Enzymes: No results for input(s): "CKTOTAL", "CKMB", "CKMBINDEX", "TROPONINI" in the last 168 hours. BNP (last 3 results) No results for input(s): "PROBNP" in the last 8760  hours. HbA1C: No results for input(s): "HGBA1C" in the last 72 hours. CBG: No results for input(s): "GLUCAP" in the last 168 hours. Lipid Profile: No results for input(s): "CHOL", "HDL", "LDLCALC", "TRIG", "CHOLHDL", "LDLDIRECT" in the last 72 hours. Thyroid Function Tests: No results for input(s): "TSH", "T4TOTAL", "FREET4", "T3FREE", "THYROIDAB" in the last 72 hours. Anemia Panel: No results for input(s): "VITAMINB12", "FOLATE", "FERRITIN", "TIBC", "IRON", "RETICCTPCT" in the last 72 hours. Sepsis Labs: Recent Labs  Lab 09/27/22 2321 09/28/22 0107 09/28/22 0348  PROCALCITON  --   --  <0.10  LATICACIDVEN 0.9 1.1  --     Recent Results (from the past 240 hour(s))  Group A Strep by PCR  ([facility] Only)     Status: None   Collection Time: 09/27/22  7:51 PM   Specimen: Throat; Sterile Swab  Result Value Ref Range Status   Group A Strep by PCR NOT DETECTED NOT DETECTED Final    Comment: Performed at [facility], [address] [place], [place] [address]  Resp panel by RT-PCR (RSV, Flu A&B, Covid) Anterior Nasal Swab     Status: None   Collection Time: 09/27/22 11:28 PM   Specimen: Anterior Nasal Swab  Result Value Ref Range Status   SARS Coronavirus 2 by RT PCR NEGATIVE NEGATIVE Final    Comment: (NOTE) SARS-CoV-2 target nucleic acids are NOT DETECTED.  The SARS-CoV-2 RNA is generally detectable in upper respiratory specimens during the acute phase of infection. The lowest concentration of SARS-CoV-2 viral copies this assay can detect is 138 copies/mL. A negative result does not preclude SARS-Cov-2 infection and should not be used as the sole basis for treatment or other patient management decisions. A negative result may occur with  improper specimen collection/handling, submission of specimen other than nasopharyngeal swab, presence of viral mutation(s) within the areas targeted by this assay, and inadequate number of viral copies(<138 copies/mL). A negative result must be combined with clinical observations, patient history, and epidemiological information. The expected result is Negative.  Fact Sheet for Patients:  BloggerCourse.com  Fact Sheet for Healthcare Providers:  SeriousBroker.it  This test is no t yet approved or cleared by the [place] FDA and  has been authorized for detection and/or diagnosis of SARS-CoV-2 by FDA under an Emergency Use Authorization (EUA). This EUA will remain  in effect (meaning this test can be used) for the duration of the COVID-19 declaration under Section 564(b)(1) of the Act, 21 U.S.C.section 360bbb-3(b)(1), unless the authorization is terminated  or revoked  sooner.       Influenza A by PCR NEGATIVE NEGATIVE Final   Influenza B by PCR NEGATIVE NEGATIVE Final    Comment: (NOTE) The Xpert Xpress SARS-CoV-2/FLU/RSV plus assay is intended as an aid in the diagnosis of influenza from Nasopharyngeal swab specimens and should not be used as a sole basis for treatment. Nasal washings and aspirates are unacceptable for Xpert Xpress SARS-CoV-2/FLU/RSV testing.  Fact Sheet for Patients: BloggerCourse.com  Fact Sheet for Healthcare Providers: SeriousBroker.it  This test is not yet approved or cleared by the [place] FDA and has been authorized for detection and/or diagnosis of SARS-CoV-2 by FDA under an Emergency Use Authorization (EUA). This EUA will remain in effect (meaning this test can be used) for the duration of the COVID-19 declaration under Section 564(b)(1) of the Act, 21 U.S.C. section 360bbb-3(b)(1), unless the authorization is terminated or revoked.     Resp Syncytial Virus by PCR NEGATIVE NEGATIVE Final    Comment: (NOTE) Fact  Sheet for Patients: BloggerCourse.com  Fact Sheet for Healthcare Providers: SeriousBroker.it  This test is not yet approved or cleared by the [place] FDA and has been authorized for detection and/or diagnosis of SARS-CoV-2 by FDA under an Emergency Use Authorization (EUA). This EUA will remain in effect (meaning this test can be used) for the duration of the COVID-19 declaration under Section 564(b)(1) of the Act, 21 U.S.C. section 360bbb-3(b)(1), unless the authorization is terminated or revoked.  Performed at [facility], [address] [place], [place] [address]   Culture, blood (Routine X 2) w Reflex to ID Panel     Status: None (Preliminary result)   Collection Time: 09/28/22  8:38 AM   Specimen: BLOOD  Result Value Ref Range Status   Specimen Description BLOOD LFOA   Final   Special Requests   Final    BOTTLES DRAWN AEROBIC AND ANAEROBIC Blood Culture results may not be optimal due to an excessive volume of blood received in culture bottles   Culture   Final    NO GROWTH < 24 HOURS Performed at [facility], [address], [place], [place] [address]    Report Status PENDING  Incomplete  Culture, blood (Routine X 2) w Reflex to ID Panel     Status: None (Preliminary result)   Collection Time: 09/28/22  8:38 AM   Specimen: BLOOD  Result Value Ref Range Status   Specimen Description BLOOD [facility]  Final   Special Requests   Final    BOTTLES DRAWN AEROBIC ONLY Blood Culture results may not be optimal due to an excessive volume of blood received in culture bottles   Culture   Final    NO GROWTH < 24 HOURS Performed at [facility], [address] [place], [place] [address]    Report Status PENDING  Incomplete  Aerobic/Anaerobic Culture w Gram Stain (surgical/deep wound)     Status: None (Preliminary result)   Collection Time: 09/28/22  9:35 AM   Specimen: PATH Tonsil/Adenoid; Abscess  Result Value Ref Range Status   Specimen Description   Final    TONSIL Performed at [facility], [address], [place], [place] [address]    Special Requests   Final    RIGHT TONSIL PALLATINE PERITONSILLAR ABSCESS Performed at [facility], [address] [place], [place] [address]    Gram Stain   Final    RARE WBC PRESENT, PREDOMINANTLY PMN FEW GRAM POSITIVE COCCI Performed at [facility], [address], [place], [place] [address]    Culture PENDING  Incomplete   Report Status PENDING  Incomplete         Radiology Studies: CT Soft Tissue Neck W Contrast  Result Date: 09/27/2022 CLINICAL DATA:  Tonsillitis EXAM: CT NECK WITH CONTRAST TECHNIQUE: Multidetector CT imaging of the neck was performed using the standard protocol following the bolus administration of intravenous contrast. RADIATION DOSE REDUCTION:  This exam was performed according to the departmental dose-optimization program which includes automated exposure control, adjustment of the mA and/or kV according to patient size and/or use of iterative reconstruction technique. CONTRAST:  75mL OMNIPAQUE IOHEXOL 300 MG/ML  SOLN COMPARISON:  None Available. FINDINGS: Pharynx and larynx: The palatine and adenoid tonsils are enlarged. There is a 2.6 x 1.9 x 3.1 cm right palatine peritonsillar abscess. Small amount of right asymmetric right retropharyngeal fluid. No retropharyngeal abscess. The epiglottis and larynx are normal. Salivary glands: No inflammation, mass, or stone. Thyroid: Normal. Lymph nodes:  Bilateral reactive cervical lymph nodes. Vascular: Negative. Limited intracranial: Negative. Visualized orbits: Negative. Mastoids and visualized paranasal sinuses: Clear. Skeleton: No acute or aggressive process. Reversal of normal cervical lordosis. Upper chest: Negative. Other: None. IMPRESSION: 1. Acute tonsillopharyngitis with 2.6 x 1.9 x 3.1 cm right palatine peritonsillar abscess. 2. Small amount of right asymmetric retropharyngeal fluid, likely reactive. No retropharyngeal abscess. 3. Reactive cervical adenopathy. Electronically Signed   By: Deatra Robinson M.D.   On: 09/27/2022 22:27        Scheduled Meds:  enoxaparin (LOVENOX) injection  40 mg Subcutaneous Q24H   potassium chloride  20 mEq Oral Once   Continuous Infusions:  ampicillin-sulbactam (UNASYN) IV 3 g (09/29/22 0252)     LOS: 2 days    Time spent: 25 mins    Charise Killian, MD [facility] Pager [phone]  If 7PM-7AM, please contact night-coverage [email or web address] 09/29/2022, 7:14 AM

## 2022-09-29 NOTE — Plan of Care (Signed)
  Problem: Clinical Measurements: Goal: Signs and symptoms of infection will decrease Outcome: Progressing   Problem: Clinical Measurements: Goal: Diagnostic test results will improve Outcome: Progressing   Problem: Respiratory: Goal: Ability to maintain adequate ventilation will improve Outcome: Progressing   Problem: Education: Goal: Knowledge of General Education information will improve Description: Including pain rating scale, medication(s)/side effects and non-pharmacologic comfort measures Outcome: Progressing

## 2022-09-30 DIAGNOSIS — J36 Peritonsillar abscess: Secondary | ICD-10-CM | POA: Diagnosis not present

## 2022-09-30 MED ORDER — POTASSIUM CHLORIDE CRYS ER 20 MEQ PO TBCR
40.0000 meq | EXTENDED_RELEASE_TABLET | Freq: Two times a day (BID) | ORAL | Status: DC
  Administered 2022-09-30: 40 meq via ORAL
  Filled 2022-09-30: qty 2

## 2022-09-30 MED ORDER — AMOXICILLIN-POT CLAVULANATE 875-125 MG PO TABS: 1.0000 | ORAL_TABLET | Freq: Two times a day (BID) | ORAL | 0 refills | Status: AC

## 2022-09-30 MED ORDER — OXYCODONE-ACETAMINOPHEN 5-325 MG PO TABS: 1.0000 | ORAL_TABLET | Freq: Four times a day (QID) | ORAL | 0 refills | Status: AC | PRN

## 2022-09-30 NOTE — Plan of Care (Signed)

## 2022-09-30 NOTE — Progress Notes (Signed)
Discharge instructions reviewed with patient including followup visits and new medications.  Understanding was verbalized and all questions were answered.  IV removed without complication; patient tolerated well.  Patient discharged home via wheelchair in stable condition escorted by nursing staff.  

## 2022-09-30 NOTE — Discharge Summary (Signed)
Physician Discharge Summary  Colleen Chapman [id number] DOB: May 07, 1995 DOA: 09/27/2022  PCP: Pcp, No  Admit date: 09/27/2022 Discharge date: 09/30/2022  Admitted From: home  Disposition:  home   Recommendations for Outpatient Follow-up:  Follow up with PCP in 1-2 weeks F/u w/ ENT, Dr. Azucena Fallen, prn   Home Health: no  Equipment/Devices:  Discharge Condition: stable  CODE STATUS: full  Diet recommendation: Regular   Brief/Interim Summary: HPI was taken from Dr. Arville Care: Colleen Chapman is a 27 y.o. female with medical history significant for ADHD and depression, on no medications, who presented to the emergency room with the onset of sore throat mainly in the right side that started on Tuesday and got significantly worse on Wednesday.  Thursday she was not able to go to work and went to urgent care.  She had a negative rapid strep test done.  She was prescribed p.o. prednisone and amoxicillin.  It got slightly better after that however after going to sleep.  Today she woke up not able to breathe with with increased right-sided throat swelling.  She denies any fever or chills.  She admitted to body aches few days ago.  She has been having diminished taste as well as right ear ache with no rhinorrhea or nasal congestion.  No chest pain or dyspnea however she has been having cough earlier is mainly dry without wheezing.   ED Course: When the patient came to the ER, BP was 140/85 with otherwise normal vital signs.  Heart rate around 92.  Labs revealed mild hypokalemia of 3.3 with otherwise normal CMP.  He showed significant leukocytosis 20.1 with neutrophilia.  Urine (negative.  Mono spot screen test came back negative.  Respiratory panel is currently pending. EKG as reviewed by me : None Imaging: CT soft tissue neck with contrast revealed acute tonsillopharyngitis with 2.6 X1.9X 3.1 cm right palatine peritonsillar abscess and small amount of right asymmetric retropharyngeal fluid, likely  reactive with no retropharyngeal abscess.  It also showed reactive cervical adenopathy.   The patient was given 2 L bolus of IV normal saline, IV Unasyn, 10 mg of IV Decadron, 2 mg of IV morphine sulfate.  Dr. Azucena Fallen was notified about the patient and is aware.  She will be admitted to a medical telemetry bed for further evaluation and management.  Discharge Diagnoses:  Principal Problem:   Peritonsillar abscess Active Problems:   Sepsis due to undetermined organism [facility])   Depression   ADHD Peritonsillar abscess: continue on IV unasyn while inpatient and d/c home on augmentin to complete the course. D/c IVFs. S/p drainage of peritonsillar abscess by ENT.  Peritonsillar gram stain growing gram positive cocci. Blood cxs NGTD   Sepsis: met criteria w/ tachycardia, leukocytosis & pperitonsillar abscess. Continue on IV unasyn and IVF. Blood cxs are pending. Sepsis resolved     Hypokalemia: potassium given    ADHD: not taking any meds for this as per med rec    Depression: severity unknown. Not on any meds for this as per med rec    Discharge Instructions  Discharge Instructions     Diet general   Complete by: As directed    Discharge instructions   Complete by: As directed    F/u w/ PCP in 1-2 weeks. F/u w/ ENT, Dr. Azucena Fallen, as needed   Increase activity slowly   Complete by: As directed       Allergies as of 09/30/2022       Reactions   Bactrim [sulfamethoxazole-trimethoprim]  Rash        Medication List     STOP taking these medications    predniSONE 20 MG tablet Commonly known as: DELTASONE       TAKE these medications    amoxicillin-clavulanate 875-125 MG tablet Commonly known as: AUGMENTIN Take 1 tablet by mouth 2 (two) times daily for 7 days.   oxyCODONE-acetaminophen 5-325 MG tablet Commonly known as: PERCOCET/ROXICET Take 1 tablet by mouth every 6 (six) hours as needed for up to 5 days for moderate pain or severe pain.        Follow-up  Information     Hackman, Renold Genta, MD Follow up.   Specialty: Otolaryngology Why: F/u as needed Contact information: [address] [place] [address] [phone]                Allergies  Allergen Reactions   Bactrim [Sulfamethoxazole-Trimethoprim] Rash    Consultations: ENT   Procedures/Studies: CT Soft Tissue Neck W Contrast  Result Date: 09/27/2022 CLINICAL DATA:  Tonsillitis EXAM: CT NECK WITH CONTRAST TECHNIQUE: Multidetector CT imaging of the neck was performed using the standard protocol following the bolus administration of intravenous contrast. RADIATION DOSE REDUCTION: This exam was performed according to the departmental dose-optimization program which includes automated exposure control, adjustment of the mA and/or kV according to patient size and/or use of iterative reconstruction technique. CONTRAST:  75mL OMNIPAQUE IOHEXOL 300 MG/ML  SOLN COMPARISON:  None Available. FINDINGS: Pharynx and larynx: The palatine and adenoid tonsils are enlarged. There is a 2.6 x 1.9 x 3.1 cm right palatine peritonsillar abscess. Small amount of right asymmetric right retropharyngeal fluid. No retropharyngeal abscess. The epiglottis and larynx are normal. Salivary glands: No inflammation, mass, or stone. Thyroid: Normal. Lymph nodes: Bilateral reactive cervical lymph nodes. Vascular: Negative. Limited intracranial: Negative. Visualized orbits: Negative. Mastoids and visualized paranasal sinuses: Clear. Skeleton: No acute or aggressive process. Reversal of normal cervical lordosis. Upper chest: Negative. Other: None. IMPRESSION: 1. Acute tonsillopharyngitis with 2.6 x 1.9 x 3.1 cm right palatine peritonsillar abscess. 2. Small amount of right asymmetric retropharyngeal fluid, likely reactive. No retropharyngeal abscess. 3. Reactive cervical adenopathy. Electronically Signed   By: Deatra Robinson M.D.   On: 09/27/2022 22:27   (Echo, Carotid, EGD, Colonoscopy,  ERCP)    Subjective: Pt c/o sore throat    Discharge Exam: Vitals:   09/30/22 0418 09/30/22 0732  BP: 104/68 114/70  Pulse: 72 73  Resp: 18 16  Temp: 98.2 F (36.8 C) (!) 97.5 F (36.4 C)  SpO2: 99% 100%   Vitals:   09/29/22 1608 09/29/22 2021 09/30/22 0418 09/30/22 0732  BP: 117/68 110/64 104/68 114/70  Pulse: 95 90 72 73  Resp: 18 18 18 16   Temp: 98.3 F (36.8 C) 99.1 F (37.3 C) 98.2 F (36.8 C) (!) 97.5 F (36.4 C)  TempSrc: Oral Oral Oral Oral  SpO2: 100% 99% 99% 100%  Weight:      Height:        General: Pt is alert, awake, not in acute distress Cardiovascular: S1/S2 +, no rubs, no gallops Respiratory: CTA bilaterally, no wheezing, no rhonchi Abdominal: Soft, NT, ND, bowel sounds + Extremities: no edema, no cyanosis    The results of significant diagnostics from this hospitalization (including imaging, microbiology, ancillary and laboratory) are listed below for reference.     Microbiology: Recent Results (from the past 240 hour(s))  Group A Strep by PCR ([facility] Only)     Status: None  Collection Time: 09/27/22  7:51 PM   Specimen: Throat; Sterile Swab  Result Value Ref Range Status   Group A Strep by PCR NOT DETECTED NOT DETECTED Final    Comment: Performed at [facility], [address] [place], [place] [address]  Resp panel by RT-PCR (RSV, Flu A&B, Covid) Anterior Nasal Swab     Status: None   Collection Time: 09/27/22 11:28 PM   Specimen: Anterior Nasal Swab  Result Value Ref Range Status   SARS Coronavirus 2 by RT PCR NEGATIVE NEGATIVE Final    Comment: (NOTE) SARS-CoV-2 target nucleic acids are NOT DETECTED.  The SARS-CoV-2 RNA is generally detectable in upper respiratory specimens during the acute phase of infection. The lowest concentration of SARS-CoV-2 viral copies this assay can detect is 138 copies/mL. A negative result does not preclude SARS-Cov-2 infection and should not be used as the sole basis for treatment or other  patient management decisions. A negative result may occur with  improper specimen collection/handling, submission of specimen other than nasopharyngeal swab, presence of viral mutation(s) within the areas targeted by this assay, and inadequate number of viral copies(<138 copies/mL). A negative result must be combined with clinical observations, patient history, and epidemiological information. The expected result is Negative.  Fact Sheet for Patients:  BloggerCourse.com  Fact Sheet for Healthcare Providers:  SeriousBroker.it  This test is no t yet approved or cleared by the [place] FDA and  has been authorized for detection and/or diagnosis of SARS-CoV-2 by FDA under an Emergency Use Authorization (EUA). This EUA will remain  in effect (meaning this test can be used) for the duration of the COVID-19 declaration under Section 564(b)(1) of the Act, 21 U.S.C.section 360bbb-3(b)(1), unless the authorization is terminated  or revoked sooner.       Influenza A by PCR NEGATIVE NEGATIVE Final   Influenza B by PCR NEGATIVE NEGATIVE Final    Comment: (NOTE) The Xpert Xpress SARS-CoV-2/FLU/RSV plus assay is intended as an aid in the diagnosis of influenza from Nasopharyngeal swab specimens and should not be used as a sole basis for treatment. Nasal washings and aspirates are unacceptable for Xpert Xpress SARS-CoV-2/FLU/RSV testing.  Fact Sheet for Patients: BloggerCourse.com  Fact Sheet for Healthcare Providers: SeriousBroker.it  This test is not yet approved or cleared by the [place] FDA and has been authorized for detection and/or diagnosis of SARS-CoV-2 by FDA under an Emergency Use Authorization (EUA). This EUA will remain in effect (meaning this test can be used) for the duration of the COVID-19 declaration under Section 564(b)(1) of the Act, 21 U.S.C. section  360bbb-3(b)(1), unless the authorization is terminated or revoked.     Resp Syncytial Virus by PCR NEGATIVE NEGATIVE Final    Comment: (NOTE) Fact Sheet for Patients: BloggerCourse.com  Fact Sheet for Healthcare Providers: SeriousBroker.it  This test is not yet approved or cleared by the [place] FDA and has been authorized for detection and/or diagnosis of SARS-CoV-2 by FDA under an Emergency Use Authorization (EUA). This EUA will remain in effect (meaning this test can be used) for the duration of the COVID-19 declaration under Section 564(b)(1) of the Act, 21 U.S.C. section 360bbb-3(b)(1), unless the authorization is terminated or revoked.  Performed at [facility], [address] [place], [place] [address]   Culture, blood (Routine X 2) w Reflex to ID Panel     Status: None (Preliminary result)   Collection Time: 09/28/22  8:38 AM   Specimen: BLOOD  Result Value Ref  Range Status   Specimen Description BLOOD LFOA  Final   Special Requests   Final    BOTTLES DRAWN AEROBIC AND ANAEROBIC Blood Culture results may not be optimal due to an excessive volume of blood received in culture bottles   Culture   Final    NO GROWTH 2 DAYS Performed at [facility], [address], [place], [place] [address]    Report Status PENDING  Incomplete  Culture, blood (Routine X 2) w Reflex to ID Panel     Status: None (Preliminary result)   Collection Time: 09/28/22  8:38 AM   Specimen: BLOOD  Result Value Ref Range Status   Specimen Description BLOOD [facility]  Final   Special Requests   Final    BOTTLES DRAWN AEROBIC ONLY Blood Culture results may not be optimal due to an excessive volume of blood received in culture bottles   Culture   Final    NO GROWTH 2 DAYS Performed at [facility], [address], [place], [place] [address]    Report Status PENDING  Incomplete  Aerobic/Anaerobic Culture w Gram Stain  (surgical/deep wound)     Status: None (Preliminary result)   Collection Time: 09/28/22  9:35 AM   Specimen: PATH Tonsil/Adenoid; Abscess  Result Value Ref Range Status   Specimen Description   Final    TONSIL Performed at [facility], [address], [place], [place] [address]    Special Requests   Final    RIGHT TONSIL PALLATINE PERITONSILLAR ABSCESS Performed at [facility], [address] [place], [place] [address]    Gram Stain   Final    RARE WBC PRESENT, PREDOMINANTLY PMN FEW GRAM POSITIVE COCCI    Culture   Final    NO GROWTH < 24 HOURS Performed at [facility], [address], [place], [place] [address]    Report Status PENDING  Incomplete     Labs: BNP (last 3 results) No results for input(s): "BNP" in the last 8760 hours. Basic Metabolic Panel: Recent Labs  Lab 09/27/22 2047 09/28/22 0348 09/29/22 0355 09/30/22 0351  NA 136 135 138 138  K 3.3* 3.7 3.4* 3.1*  CL 102 104 104 102  CO2 22 22 26 27   GLUCOSE 91 132* 93 124*  BUN 13 9 13 10   CREATININE 0.78 0.58 0.67 0.69  CALCIUM 8.9 8.4* 8.7* 8.9   Liver Function Tests: Recent Labs  Lab 09/27/22 2047  AST 15  ALT 12  ALKPHOS 69  BILITOT 0.7  PROT 8.6*  ALBUMIN 4.1   No results for input(s): "LIPASE", "AMYLASE" in the last 168 hours. No results for input(s): "AMMONIA" in the last 168 hours. CBC: Recent Labs  Lab 09/27/22 2047 09/28/22 0348 09/29/22 0355 09/30/22 0351  WBC 20.1* 19.3* 11.2* 10.9*  NEUTROABS 17.2*  --   --   --   HGB 15.0 13.4 12.6 13.1  HCT 43.1 38.0 36.8 39.1  MCV 88.7 87.6 88.9 89.5  PLT 316 282 280 273   Cardiac Enzymes: No results for input(s): "CKTOTAL", "CKMB", "CKMBINDEX", "TROPONINI" in the last 168 hours. BNP: Invalid input(s): "POCBNP" CBG: No results for input(s): "GLUCAP" in the last 168 hours. D-Dimer No results for input(s): "DDIMER" in the last 72 hours. Hgb A1c No results for input(s): "HGBA1C" in the last 72 hours. Lipid  Profile No results for input(s): "CHOL", "HDL", "LDLCALC", "TRIG", "CHOLHDL", "LDLDIRECT" in the last 72 hours. Thyroid function studies No results for input(s): "TSH", "  T4TOTAL", "T3FREE", "THYROIDAB" in the last 72 hours.  Invalid input(s): "FREET3" Anemia work up No results for input(s): "VITAMINB12", "FOLATE", "FERRITIN", "TIBC", "IRON", "RETICCTPCT" in the last 72 hours. Urinalysis    Component Value Date/Time   COLORURINE YELLOW (A) 07/08/2019 1332   APPEARANCEUR CLOUDY (A) 07/08/2019 1332   APPEARANCEUR Clear 06/15/2019 1430   LABSPEC 1.017 07/08/2019 1332   PHURINE 6.0 07/08/2019 1332   GLUCOSEU NEGATIVE 07/08/2019 1332   HGBUR NEGATIVE 07/08/2019 1332   BILIRUBINUR NEGATIVE 07/08/2019 1332   BILIRUBINUR Negative 06/15/2019 1430   KETONESUR NEGATIVE 07/08/2019 1332   PROTEINUR NEGATIVE 07/08/2019 1332   NITRITE NEGATIVE 07/08/2019 1332   LEUKOCYTESUR MODERATE (A) 07/08/2019 1332   Sepsis Labs Recent Labs  Lab 09/27/22 2047 09/28/22 0348 09/29/22 0355 09/30/22 0351  WBC 20.1* 19.3* 11.2* 10.9*   Microbiology Recent Results (from the past 240 hour(s))  Group A Strep by PCR ([facility] Only)     Status: None   Collection Time: 09/27/22  7:51 PM   Specimen: Throat; Sterile Swab  Result Value Ref Range Status   Group A Strep by PCR NOT DETECTED NOT DETECTED Final    Comment: Performed at [facility], [address] [place], [place] [address]  Resp panel by RT-PCR (RSV, Flu A&B, Covid) Anterior Nasal Swab     Status: None   Collection Time: 09/27/22 11:28 PM   Specimen: Anterior Nasal Swab  Result Value Ref Range Status   SARS Coronavirus 2 by RT PCR NEGATIVE NEGATIVE Final    Comment: (NOTE) SARS-CoV-2 target nucleic acids are NOT DETECTED.  The SARS-CoV-2 RNA is generally detectable in upper respiratory specimens during the acute phase of infection. The lowest concentration of SARS-CoV-2 viral copies this assay can detect is 138 copies/mL. A negative  result does not preclude SARS-Cov-2 infection and should not be used as the sole basis for treatment or other patient management decisions. A negative result may occur with  improper specimen collection/handling, submission of specimen other than nasopharyngeal swab, presence of viral mutation(s) within the areas targeted by this assay, and inadequate number of viral copies(<138 copies/mL). A negative result must be combined with clinical observations, patient history, and epidemiological information. The expected result is Negative.  Fact Sheet for Patients:  BloggerCourse.com  Fact Sheet for Healthcare Providers:  SeriousBroker.it  This test is no t yet approved or cleared by the [place] FDA and  has been authorized for detection and/or diagnosis of SARS-CoV-2 by FDA under an Emergency Use Authorization (EUA). This EUA will remain  in effect (meaning this test can be used) for the duration of the COVID-19 declaration under Section 564(b)(1) of the Act, 21 U.S.C.section 360bbb-3(b)(1), unless the authorization is terminated  or revoked sooner.       Influenza A by PCR NEGATIVE NEGATIVE Final   Influenza B by PCR NEGATIVE NEGATIVE Final    Comment: (NOTE) The Xpert Xpress SARS-CoV-2/FLU/RSV plus assay is intended as an aid in the diagnosis of influenza from Nasopharyngeal swab specimens and should not be used as a sole basis for treatment. Nasal washings and aspirates are unacceptable for Xpert Xpress SARS-CoV-2/FLU/RSV testing.  Fact Sheet for Patients: BloggerCourse.com  Fact Sheet for Healthcare Providers: SeriousBroker.it  This test is not yet approved or cleared by the [place] FDA and has been authorized for detection and/or diagnosis of SARS-CoV-2 by FDA under an Emergency Use Authorization (EUA). This EUA will remain in effect (meaning this test can be used)  for the duration of  the COVID-19 declaration under Section 564(b)(1) of the Act, 21 U.S.C. section 360bbb-3(b)(1), unless the authorization is terminated or revoked.     Resp Syncytial Virus by PCR NEGATIVE NEGATIVE Final    Comment: (NOTE) Fact Sheet for Patients: BloggerCourse.com  Fact Sheet for Healthcare Providers: SeriousBroker.it  This test is not yet approved or cleared by the [place] FDA and has been authorized for detection and/or diagnosis of SARS-CoV-2 by FDA under an Emergency Use Authorization (EUA). This EUA will remain in effect (meaning this test can be used) for the duration of the COVID-19 declaration under Section 564(b)(1) of the Act, 21 U.S.C. section 360bbb-3(b)(1), unless the authorization is terminated or revoked.  Performed at [facility], [address] [place], [place] [address]   Culture, blood (Routine X 2) w Reflex to ID Panel     Status: None (Preliminary result)   Collection Time: 09/28/22  8:38 AM   Specimen: BLOOD  Result Value Ref Range Status   Specimen Description BLOOD LFOA  Final   Special Requests   Final    BOTTLES DRAWN AEROBIC AND ANAEROBIC Blood Culture results may not be optimal due to an excessive volume of blood received in culture bottles   Culture   Final    NO GROWTH 2 DAYS Performed at [facility], [address], [place], [place] [address]    Report Status PENDING  Incomplete  Culture, blood (Routine X 2) w Reflex to ID Panel     Status: None (Preliminary result)   Collection Time: 09/28/22  8:38 AM   Specimen: BLOOD  Result Value Ref Range Status   Specimen Description BLOOD [facility]  Final   Special Requests   Final    BOTTLES DRAWN AEROBIC ONLY Blood Culture results may not be optimal due to an excessive volume of blood received in culture bottles   Culture   Final    NO GROWTH 2 DAYS Performed at [facility], [address]  [place], [place] [address]    Report Status PENDING  Incomplete  Aerobic/Anaerobic Culture w Gram Stain (surgical/deep wound)     Status: None (Preliminary result)   Collection Time: 09/28/22  9:35 AM   Specimen: PATH Tonsil/Adenoid; Abscess  Result Value Ref Range Status   Specimen Description   Final    TONSIL Performed at [facility], [address] [place], [place] [address]    Special Requests   Final    RIGHT TONSIL PALLATINE PERITONSILLAR ABSCESS Performed at [facility], [address] [place], [place] [address]    Gram Stain   Final    RARE WBC PRESENT, PREDOMINANTLY PMN FEW GRAM POSITIVE COCCI    Culture   Final    NO GROWTH < 24 HOURS Performed at [facility], [address] [place], [place] [address]    Report Status PENDING  Incomplete     Time coordinating discharge: Over 30 minutes  SIGNED:   Charise Killian, MD  [facility] 09/30/2022, 11:49 AM Pager   If 7PM-7AM, please contact night-coverage [email or web address]

## 2022-10-02 LAB — AEROBIC/ANAEROBIC CULTURE W GRAM STAIN (SURGICAL/DEEP WOUND)

## 2022-10-03 LAB — CULTURE, BLOOD (ROUTINE X 2)
Culture: NO GROWTH
Culture: NO GROWTH

## 2022-10-15 ENCOUNTER — Observation Stay
Admission: EM | Admit: 2022-10-15 | Discharge: 2022-10-18 | DRG: 145 | Disposition: A | Discharge status: Home / Self Care | Payer: Medicaid Other | Attending: Otolaryngology | Admitting: Otolaryngology

## 2022-10-15 ENCOUNTER — Emergency Department: Payer: Medicaid Other

## 2022-10-15 ENCOUNTER — Other Ambulatory Visit: Payer: Self-pay

## 2022-10-15 DIAGNOSIS — J36 Peritonsillar abscess: Secondary | ICD-10-CM | POA: Diagnosis not present

## 2022-10-15 DIAGNOSIS — Z8249 Family history of ischemic heart disease and other diseases of the circulatory system: Secondary | ICD-10-CM | POA: Diagnosis not present

## 2022-10-15 DIAGNOSIS — Z833 Family history of diabetes mellitus: Secondary | ICD-10-CM | POA: Diagnosis not present

## 2022-10-15 DIAGNOSIS — F32A Depression, unspecified: Secondary | ICD-10-CM | POA: Diagnosis present

## 2022-10-15 DIAGNOSIS — Z881 Allergy status to other antibiotic agents status: Secondary | ICD-10-CM | POA: Diagnosis not present

## 2022-10-15 DIAGNOSIS — Z825 Family history of asthma and other chronic lower respiratory diseases: Secondary | ICD-10-CM | POA: Diagnosis not present

## 2022-10-15 DIAGNOSIS — D72828 Other elevated white blood cell count: Secondary | ICD-10-CM | POA: Diagnosis present

## 2022-10-15 DIAGNOSIS — Z818 Family history of other mental and behavioral disorders: Secondary | ICD-10-CM

## 2022-10-15 DIAGNOSIS — J039 Acute tonsillitis, unspecified: Secondary | ICD-10-CM | POA: Diagnosis not present

## 2022-10-15 LAB — CBC WITH DIFFERENTIAL/PLATELET
Abs Immature Granulocytes: 0.06 10*3/uL (ref 0.00–0.07)
Basophils Absolute: 0 10*3/uL (ref 0.0–0.1)
Basophils Relative: 0 %
Eosinophils Absolute: 0.1 10*3/uL (ref 0.0–0.5)
Eosinophils Relative: 1 %
HCT: 41.7 % (ref 36.0–46.0)
Hemoglobin: 14.1 g/dL (ref 12.0–15.0)
Immature Granulocytes: 0 %
Lymphocytes Relative: 11 %
Lymphs Abs: 1.5 10*3/uL (ref 0.7–4.0)
MCH: 30.7 pg (ref 26.0–34.0)
MCHC: 33.8 g/dL (ref 30.0–36.0)
MCV: 90.7 fL (ref 80.0–100.0)
Monocytes Absolute: 0.7 10*3/uL (ref 0.1–1.0)
Monocytes Relative: 5 %
Neutro Abs: 11.2 10*3/uL — ABNORMAL HIGH (ref 1.7–7.7)
Neutrophils Relative %: 83 %
Platelets: 287 10*3/uL (ref 150–400)
RBC: 4.6 MIL/uL (ref 3.87–5.11)
RDW: 12.5 % (ref 11.5–15.5)
WBC: 13.5 10*3/uL — ABNORMAL HIGH (ref 4.0–10.5)
nRBC: 0 % (ref 0.0–0.2)

## 2022-10-15 LAB — COMPREHENSIVE METABOLIC PANEL
ALT: 15 U/L (ref 0–44)
AST: 14 U/L — ABNORMAL LOW (ref 15–41)
Albumin: 4 g/dL (ref 3.5–5.0)
Alkaline Phosphatase: 59 U/L (ref 38–126)
Anion gap: 9 (ref 5–15)
BUN: 13 mg/dL (ref 6–20)
CO2: 25 mmol/L (ref 22–32)
Calcium: 9.1 mg/dL (ref 8.9–10.3)
Chloride: 103 mmol/L (ref 98–111)
Creatinine, Ser: 0.72 mg/dL (ref 0.44–1.00)
GFR, Estimated: >60 mL/min (ref >60)
Glucose, Bld: 88 mg/dL (ref 70–99)
Potassium: 3.5 mmol/L (ref 3.5–5.1)
Sodium: 137 mmol/L (ref 135–145)
Total Bilirubin: 1 mg/dL (ref 0.3–1.2)
Total Protein: 7.7 g/dL (ref 6.5–8.1)

## 2022-10-15 LAB — GROUP A STREP BY PCR: Group A Strep by PCR: NOT DETECTED

## 2022-10-15 LAB — POC URINE PREG, ED: Preg Test, Ur: NEGATIVE

## 2022-10-15 MED ORDER — ONDANSETRON HCL 4 MG/2ML IJ SOLN: 4.0000 mg | Freq: Four times a day (QID) | INTRAMUSCULAR | Status: DC | PRN

## 2022-10-15 MED ORDER — HYDROMORPHONE HCL 1 MG/ML IJ SOLN: 0.5000 mg | INTRAMUSCULAR | Status: DC | PRN

## 2022-10-15 MED ORDER — PIPERACILLIN-TAZOBACTAM 3.375 G IVPB
3.3750 g | Freq: Three times a day (TID) | INTRAVENOUS | Status: DC
  Administered 2022-10-15 – 2022-10-18 (×8): 3.375 g via INTRAVENOUS
  Filled 2022-10-15 (×9): qty 50

## 2022-10-15 MED ORDER — ENOXAPARIN SODIUM 40 MG/0.4ML IJ SOSY
40.0000 mg | PREFILLED_SYRINGE | INTRAMUSCULAR | Status: DC
  Administered 2022-10-16 – 2022-10-18 (×3): 40 mg via SUBCUTANEOUS
  Filled 2022-10-15 (×3): qty 0.4

## 2022-10-15 MED ORDER — SODIUM CHLORIDE 0.9 % IV BOLUS
1000.0000 mL | Freq: Once | INTRAVENOUS | Status: AC
  Administered 2022-10-15: 1000 mL via INTRAVENOUS

## 2022-10-15 MED ORDER — ACETAMINOPHEN 650 MG RE SUPP: 650.0000 mg | Freq: Four times a day (QID) | RECTAL | Status: DC | PRN

## 2022-10-15 MED ORDER — HYDROCODONE-ACETAMINOPHEN 5-325 MG PO TABS
1.0000 | ORAL_TABLET | ORAL | Status: DC | PRN
  Administered 2022-10-16 – 2022-10-17 (×4): 1 via ORAL
  Filled 2022-10-15 (×4): qty 1

## 2022-10-15 MED ORDER — ACETAMINOPHEN 325 MG PO TABS: 650.0000 mg | ORAL_TABLET | Freq: Four times a day (QID) | ORAL | Status: DC | PRN

## 2022-10-15 MED ORDER — PIPERACILLIN-TAZOBACTAM 3.375 G IVPB 30 MIN
3.3750 g | Freq: Once | INTRAVENOUS | Status: AC
  Administered 2022-10-15: 3.375 g via INTRAVENOUS
  Filled 2022-10-15: qty 50

## 2022-10-15 MED ORDER — SODIUM CHLORIDE 0.9% FLUSH
3.0000 mL | Freq: Two times a day (BID) | INTRAVENOUS | Status: DC
  Administered 2022-10-15 – 2022-10-18 (×4): 3 mL via INTRAVENOUS

## 2022-10-15 MED ORDER — LORAZEPAM 2 MG/ML IJ SOLN
1.0000 mg | Freq: Once | INTRAMUSCULAR | Status: AC
  Administered 2022-10-15: 1 mg via INTRAVENOUS
  Filled 2022-10-15: qty 1

## 2022-10-15 MED ORDER — LIDOCAINE-EPINEPHRINE 2 %-1:100000 IJ SOLN
20.0000 mL | Freq: Once | INTRAMUSCULAR | Status: AC
  Administered 2022-10-15: 20 mL
  Filled 2022-10-15: qty 1

## 2022-10-15 MED ORDER — ONDANSETRON HCL 4 MG PO TABS: 4.0000 mg | ORAL_TABLET | Freq: Four times a day (QID) | ORAL | Status: DC | PRN

## 2022-10-15 MED ORDER — POLYETHYLENE GLYCOL 3350 17 G PO PACK: 17.0000 g | PACK | Freq: Every day | ORAL | Status: DC | PRN

## 2022-10-15 MED ORDER — KETOROLAC TROMETHAMINE 15 MG/ML IJ SOLN
15.0000 mg | Freq: Once | INTRAMUSCULAR | Status: AC
  Administered 2022-10-15: 15 mg via INTRAVENOUS
  Filled 2022-10-15: qty 1

## 2022-10-15 MED ORDER — DEXAMETHASONE SODIUM PHOSPHATE 10 MG/ML IJ SOLN
10.0000 mg | Freq: Once | INTRAMUSCULAR | Status: AC
  Administered 2022-10-15: 10 mg via INTRAVENOUS
  Filled 2022-10-15: qty 1

## 2022-10-15 MED ORDER — IOHEXOL 300 MG/ML  SOLN
75.0000 mL | Freq: Once | INTRAMUSCULAR | Status: AC | PRN
  Administered 2022-10-15: 75 mL via INTRAVENOUS

## 2022-10-15 NOTE — ED Notes (Signed)
See triage note  Presents with sore throat  Recent hx of throat abscess  Was admitted for same  Finished antibiotics recently  but throat feels the same   Afebrile on arrival

## 2022-10-15 NOTE — Consult Note (Signed)
Pharmacy Antibiotic Note  Colleen Chapman is a 27 y.o. female admitted on 10/15/2022 with  peritonsillar abscess .  Pharmacy has been consulted for Pip/tazo. dosing. Afeb, WBC 13.5. pt states she completed abx outpatient.   Abscess cx (09/28/22):   ABUNDANT FUSOBACTERIUM NECROPHORUM    Plan: Zosyn 3.375g IV q8h (4 hour infusion).  Height: 5\' 6"  (167.6 cm) Weight: 78.9 kg (174 lb) IBW/kg (Calculated) : 59.3  Temp (24hrs), Avg:98.3 F (36.8 C), Min:98.2 F (36.8 C), Max:98.3 F (36.8 C)  Recent Labs  Lab 10/15/22 1445  WBC 13.5*  CREATININE 0.72    Estimated Creatinine Clearance: 111.9 mL/min (by C-G formula based on SCr of 0.72 mg/dL).    Allergies  Allergen Reactions   Bactrim [Sulfamethoxazole-Trimethoprim] Rash    Antimicrobials this admission: 8/13 pip/tazo >>   Dose adjustments this admission: None  Microbiology results: 8/13 MRSA PCR: negative  Thank you for allowing pharmacy to be a part of this patient's care.  Ronnald Ramp, PharmD, BCPS 10/15/2022 6:36 PM

## 2022-10-15 NOTE — Assessment & Plan Note (Signed)
No longer on medication treatment. Well controlled at this time.

## 2022-10-15 NOTE — ED Notes (Signed)
Pt refusing blood work until she "needs a IV or I'm in a room".

## 2022-10-15 NOTE — ED Triage Notes (Addendum)
Pt presents to the ED with c/o of sore throat, pt states she was recently discharged "because I had a abscess in my throat". Pt states D/C'ed 2 weeks ago. Pt denies fevers or chills. Pt states she finished PO ABX recently. Pt states it feels the same and also endorses its changing her voice.

## 2022-10-15 NOTE — H&P (Signed)
History and Physical    Patient: Colleen Chapman ZOX:[id number] DOB: 1995-12-15 DOA: 10/15/2022 DOS: the patient was seen and examined on 10/15/2022 PCP: Pcp, No  Patient coming from: Home  Chief Complaint:  Chief Complaint  Patient presents with   Sore Throat   HPI: Colleen Chapman is a 27 y.o. female with medical history significant of recent peritonsillar abscess presenting to the ED due to a sore throat.  Colleen Chapman states that she completed her antibiotic course after her most recent over 1 week ago.  She was feeling well up until yesterday when she began to experience sore throat and pain on the right side of her mouth.  She endorses body aches and feeling irritable but otherwise denies any fevers, chills, shortness of breath, dysphagia or chest pain.  She notes that previously she was experiencing trouble breathing when she came in.  ED course: On arrival to the ED, patient was normotensive at 127/85 with heart rate of 83.  She is saturating at 100% on room air.  She was afebrile at 98.2. Initial workup notable for WBC of 13.5, and normal CMP.  Urine pregnancy test negative.  Group A strep negative.  CT of the neck was obtained that demonstrated enlargement of right peritonsillar abscess with increased soft tissue stranding in the right parapharyngeal space and retropharyngeal effusion.  ENT was consulted.  Patient started on Zosyn.  TRH contacted for admission.  Review of Systems: As mentioned in the history of present illness. All other systems reviewed and are negative.  Past Medical History:  Diagnosis Date   Abnormal Pap smear of cervix 09/11/2020   ADHD (attention deficit hyperactivity disorder)    ongoing   Depression    "Because I was forced to have an abortion"   History of urinary tract infection    Pregnancy induced hypertension    Preeclampsia with 2017 delivery   Past Surgical History:  Procedure Laterality Date   EAB  01/2018   NO PAST SURGERIES      Social History:  reports that she has never smoked. She has never used smokeless tobacco. She reports that she does not currently use alcohol after a past usage of about 1.0 standard drink of alcohol per week. She reports that she does not currently use drugs after having used the following drugs: Marijuana.  Allergies  Allergen Reactions   Bactrim [Sulfamethoxazole-Trimethoprim] Rash    Family History  Problem Relation Age of Onset   Diabetes Paternal Grandfather    Cancer Paternal Grandmother        Skin Ca   Diabetes Father    Hypertension Father    Asthma Father    Bipolar disorder Mother    Anemia Mother    Deafness Mother    Birth defects Mother        born without hand    Prior to Admission medications   Not on File    Physical Exam: Vitals:   10/15/22 1239 10/15/22 1240 10/15/22 1652 10/15/22 1836  BP: 127/85  120/80 122/78  Pulse: 83  80 90  Resp: 17  16 16   Temp: 98.2 F (36.8 C)  98.3 F (36.8 C)   TempSrc: Oral  Oral   SpO2: 100%  100% 99%  Weight:  78.9 kg    Height:  5\' 6"  (1.676 m)     Physical Exam Vitals and nursing note reviewed.  Constitutional:      General: She is not in acute distress.  Appearance: She is normal weight.  HENT:     Head: Normocephalic and atraumatic.     Mouth/Throat:     Mouth: Mucous membranes are moist.     Tonsils: Tonsillar exudate and tonsillar abscess present.     Comments: Right tonsillar enlargement with known associated abscess with deviation of the uvula.  Otherwise oropharynx is clear. Neck:     Comments: No tenderness of the neck on palpation Cardiovascular:     Rate and Rhythm: Normal rate and regular rhythm.     Heart sounds: No murmur heard. Pulmonary:     Effort: Pulmonary effort is normal. No respiratory distress.     Breath sounds: No wheezing, rhonchi or rales.  Musculoskeletal:     Cervical back: Neck supple.  Lymphadenopathy:     Cervical: No cervical adenopathy.  Neurological:      General: No focal deficit present.     Mental Status: She is alert and oriented to person, place, and time.  Psychiatric:        Mood and Affect: Mood normal.        Behavior: Behavior normal.    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 RADIATION DOSE REDUCTION: This exam was performed according to the departmental dose-optimization program which includes automated exposure control, adjustment of the mA and/or kV according to patient size and/or use of iterative reconstruction technique. CONTRAST:  75mL OMNIPAQUE IOHEXOL 300 MG/ML  SOLN COMPARISON:  CT Neck 09/27/22 FINDINGS: Pharynx and larynx: There is a 2.8 x 1.7 x 3.2 cm right peritonsillar abscess with soft tissue stranding in the right parapharyngeal space. This abscess previously measured 2.5 x 1.8 x 3.1 Cm on 09/27/2022. The amount of soft tissue stranding in the parapharyngeal space is also increased. There is also slightly increased mass effect on the airway (series 2, image 31). Inflammatory changes extend inferiorly to the level of the constrictors on the right. The epiglottis is normal in appearance. There is a retropharyngeal effusion without a retropharyngeal abscess. Salivary glands: No inflammation, mass, or stone. Thyroid: Normal. Lymph nodes: None enlarged or abnormal density. Vascular: Negative. Limited intracranial: Negative.  Visualized orbits: Negative. Mastoids and visualized paranasal sinuses: Clear. Skeleton: No acute or aggressive process. Upper chest: Negative. Other: None. IMPRESSION: 1. Slight interval increase in size of the right peritonsillar abscess with increased soft tissue stranding in the right parapharyngeal space. There is also slightly increased mass effect on the airway. 2. Retropharyngeal effusion without a retropharyngeal abscess. Electronically Signed   By: Lorenza Cambridge M.D.   On: 10/15/2022 16:55    Results are pending, will review when available.  Assessment and Plan:  * Peritonsillar abscess Patient was recently admitted for peritonsillar abscess s/p drainage by ENT with cultures growing Fusobacterium neck or forearm.  Now patient is presenting with increasing sore throat, uvular deviation and evidence of increased abscess on CT despite completing her antibiotic course.  No evidence of Lemierre syndrome at  this time.  - ENT consulted; appreciate their recommendations - Continuous pulse oximetry given high risk for respiratory compromise - Continue Zosyn per pharmacy dosing - Discussed with patient that she will eventually require tonsillectomy  Depression No longer on medication treatment. Well controlled at this time.   Advance Care Planning:   Code Status: Full Code   Consults: ENT  Family Communication: No family at bedside  Severity of Illness: The appropriate patient status for this patient is INPATIENT. Inpatient status is judged to be reasonable and necessary in order to provide the required intensity of service to ensure the patient's safety. The patient's presenting symptoms, physical exam findings, and initial radiographic and laboratory data in the context of their chronic comorbidities is felt to place them at high risk for further clinical deterioration. Furthermore, it is not anticipated that the patient will be medically stable for discharge from the hospital within 2  midnights of admission.   * I certify that at the point of admission it is my clinical judgment that the patient will require inpatient hospital care spanning beyond 2 midnights from the point of admission due to high intensity of service, high risk for further deterioration and high frequency of surveillance required.*  Author: Verdene Lennert, MD 10/15/2022 6:51 PM  For on call review [email or web address].

## 2022-10-15 NOTE — Consult Note (Signed)
CC:  Recurrent Peritonsillar Abscess   HPI: 27 y.o. female seen in consultation of the [facility] for evaluation of a peritonsillar abscess.     She is s/p prior PTA drainage on 7/27 on the right side by Dr. Azucena Fallen - had some increasing pain and discomfort again over the past few days despite completing antibiotic after DC home.  Recurrent PTA on the right noted on ED exam and CT with recurrent fluid accumulation of approximately 2 cm.          Past Medical History:  Diagnosis Date   Abnormal Pap smear of cervix 09/11/2020   ADHD (attention deficit hyperactivity disorder)      ongoing   Depression      "Because I was forced to have an abortion"   History of urinary tract infection     Pregnancy induced hypertension      Preeclampsia with 2017 delivery             Past Surgical History:  Procedure Laterality Date   EAB   01/2018   NO PAST SURGERIES            Social History         Socioeconomic History   Marital status: Single      Spouse name: Teacher, music   Number of children: 3   Years of education: 12   Highest education level: 12th grade  Occupational History   Not on file  Tobacco Use   Smoking status: Never   Smokeless tobacco: Never  Vaping Use   Vaping status: Never Used  Substance and Sexual Activity   Alcohol use: Not Currently      Alcohol/week: 1.0 standard drink of alcohol      Types: 1 Standard drinks or equivalent per week      Comment: last use 2022 can't remember when   Drug use: Not Currently      Types: Marijuana      Comment: last marijuana use 1 yr ago   Sexual activity: Yes      Partners: Male      Birth control/protection: Injection      Comment: last depo over 1 yr ago  Other Topics Concern   Not on file  Social History Narrative   Not on file    Social Determinants of Health        Financial Resource Strain: Not on file  Food Insecurity: No Food Insecurity (09/28/2022)    Hunger Vital Sign     Worried About Running Out  of Food in the Last Year: Never true     Ran Out of Food in the Last Year: Never true  Transportation Needs: No Transportation Needs (09/28/2022)    PRAPARE - Therapist, art (Medical): No     Lack of Transportation (Non-Medical): No  Physical Activity: Not on file  Stress: Not on file  Social Connections: Not on file  Intimate Partner Violence: Not At Risk (09/28/2022)    Humiliation, Afraid, Rape, and Kick questionnaire     Fear of Current or Ex-Partner: No     Emotionally Abused: No     Physically Abused: No     Sexually Abused: No    Medications Ordered Prior to Encounter  No current facility-administered medications on file prior to encounter.          Current Outpatient Medications on File Prior to Encounter  Medication Sig Dispense Refill   amoxicillin-clavulanate (AUGMENTIN) 875-125  MG tablet Take 1 tablet by mouth 2 (two) times daily.       predniSONE (DELTASONE) 20 MG tablet Take 20 mg by mouth 2 (two) times daily.          Allergies      Allergies  Allergen Reactions   Bactrim [Sulfamethoxazole-Trimethoprim] Rash      BP 119/76 (BP Location: Left Arm)   Pulse 82   Temp 98 F (36.7 C)   Resp 16   Ht 5\' 6"  (1.676 m)   Wt 78.9 kg   SpO2 100%   BMI 28.08 kg/m  Gen:  A&O x 3, NAD, normal voice CV:  RRR Chest: Breathing comfortably Neck: No obvious adenopathy, tracheal midline OC/OP Bulging of the right soft palate with erythema   MEDICAL Decision making:   I reviewed the CT neck from 7/26 and today which revealed a recurrent right sided 2.0 cm right fluid collection consistent with a recurrent PTA.   Procedure   Dx:  Right recurrent Peritonsillar abscess Post-procedure Dx:  same  Procedure:  Right PTA drainage (CPT 42700), transorally  Specimens:   6 cc purulent fluid sent for gram stain and culture.   After informing patient of procedure and obtaining verbal consent, local anesthesia (1.5 cc of 2% lidocaine with 1:100,000  epinephrine was injected into the right soft palate, just above the tonsil with a 27 gauge needle.  Then an 18 gauge needle was used to aspirate approximately 6 cc of purulent fluid, which was sent for GS and culture.  The space was further opened with a 15 blade and a tonsil forceps.  Patient reports improvement in mouth/throat pain after aspiration.  No acute significant bleeding or other complication.    Complications:  none   A/P:  27 y.o. female with a recurrent right PTA s/p drainage on the right side.  Would continued IV ABRx.  Hospitalist team recommends admission for observation, IV antibiotics, and fluids.  Anticipate possible DC home tomorrow.  Recommend f/u in ENT clinic (Dr. Okey Dupre in [facility], [phone]) in 3-4 weeks.    Greatly appreciate help of ED staff PA and nursing as well as the Hospitalist team.   Magnus Ivan. Okey Dupre, MD, MBA, FARS Otolaryngology-Head & Neck Surgery [facility]

## 2022-10-15 NOTE — Assessment & Plan Note (Addendum)
Patient was recently admitted for peritonsillar abscess s/p drainage by ENT with cultures growing Fusobacterium neck or forearm.  Now patient is presenting with increasing sore throat, uvular deviation and evidence of increased abscess on CT despite completing her antibiotic course.  No evidence of Lemierre syndrome at this time.  - ENT consulted; appreciate their recommendations - Continuous pulse oximetry given high risk for respiratory compromise - Continue Zosyn per pharmacy dosing - Discussed with patient that she will eventually require tonsillectomy

## 2022-10-15 NOTE — ED Notes (Signed)
Dr Cook in with pt   

## 2022-10-15 NOTE — ED Provider Notes (Signed)
[facility] Provider Note    Event Date/Time   First MD Initiated Contact with Patient 10/15/22 1413     (approximate)   History   Sore Throat   HPI  Colleen Chapman is a 27 y.o. female who presents today for evaluation of sore throat and muffled voice that began yesterday.  Patient reports that after her abscess was drained at the end of July she was feeling much better, and completed the antibiotics.  She reports that she been doing fine until yesterday when she began to have a sore throat again.  She is able to swallow, though reports that she has pain with swallowing.  She has been able to handle her secretions.  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 01/04/2019   History of postpartum depression 2016 12/30/2018   Hx of preeclampsia, prior pregnancy 2017 12/30/2018   ADHD 12/30/2018   Family history of deafness in mom 12/30/2018   Depression 04/17/2018          Physical Exam   Triage Vital Signs: ED Triage Vitals  Encounter Vitals Group     BP 10/15/22 1239 127/85     Systolic BP Percentile --      Diastolic BP Percentile --      Pulse Rate 10/15/22 1239 83     Resp 10/15/22 1239 17     Temp 10/15/22 1239 98.2 F (36.8 C)     Temp Source 10/15/22 1239 Oral     SpO2 10/15/22 1239 100 %     Weight 10/15/22 1240 174 lb (78.9 kg)     Height 10/15/22 1240 5\' 6"  (1.676 m)     Head Circumference --      Peak Flow --      Pain Score 10/15/22 1239 7     Pain Loc --      Pain Education --      Exclude from Growth Chart --     Most recent vital signs: Vitals:   10/15/22 1652 10/15/22 1836  BP: 120/80 122/78  Pulse: 80 90  Resp: 16 16  Temp: 98.3 F (36.8 C)   SpO2: 100% 99%    Physical  Exam Vitals and nursing note reviewed.  Constitutional:      General: Awake and alert. No acute distress.    Appearance: Normal appearance. The patient is normal weight.  HENT:     Head: Normocephalic and atraumatic.     Mouth: Mucous membranes are moist. Uvula deviated to the left.  No tonsillar exudate.  Soft palate fluctuance present on the right.  No trismus. Mild muffled voice.  No sublingual swelling.  R>L tender cervical lymphadenopathy.  No nuchal rigidity. No drooling Eyes:     General: PERRL. Normal EOMs        Right eye: No discharge.        Left eye: No discharge.     Conjunctiva/sclera: Conjunctivae normal.  Cardiovascular:     Rate and Rhythm: Normal rate and regular rhythm.     Pulses: Normal pulses.  Pulmonary:     Effort: Pulmonary effort is normal. No respiratory distress.     Breath sounds: Normal breath sounds.  Abdominal:     Abdomen is soft. There is no  abdominal tenderness. No rebound or guarding. No distention. Musculoskeletal:        General: No swelling. Normal range of motion.     Cervical back: Normal range of motion and neck supple.  Skin:    General: Skin is warm and dry.     Capillary Refill: Capillary refill takes less than 2 seconds.     Findings: No rash.  Neurological:     Mental Status: The patient is awake and alert.      ED Results / Procedures / Treatments   Labs (all labs ordered are listed, but only abnormal results are displayed) Labs Reviewed  CBC WITH DIFFERENTIAL/PLATELET - Abnormal; Notable for the following components:      Result Value   WBC 13.5 (*)    Neutro Abs 11.2 (*)    All other components within normal limits  COMPREHENSIVE METABOLIC PANEL - Abnormal; Notable for the following components:   AST 14 (*)    All other components within normal limits  GROUP A STREP BY PCR  BODY FLUID CULTURE W GRAM STAIN  POC URINE PREG, ED     EKG     RADIOLOGY I independently reviewed and interpreted imaging and agree with  radiologists findings.     PROCEDURES:  Critical Care performed:   Procedures   MEDICATIONS ORDERED IN ED: Medications  enoxaparin (LOVENOX) injection 40 mg (has no administration in time range)  sodium chloride flush (NS) 0.9 % injection 3 mL (has no administration in time range)  acetaminophen (TYLENOL) tablet 650 mg (has no administration in time range)    Or  acetaminophen (TYLENOL) suppository 650 mg (has no administration in time range)  HYDROmorphone (DILAUDID) injection 0.5-1 mg (has no administration in time range)  HYDROcodone-acetaminophen (NORCO/VICODIN) 5-325 MG per tablet 1 tablet (has no administration in time range)  polyethylene glycol (MIRALAX / GLYCOLAX) packet 17 g (has no administration in time range)  ondansetron (ZOFRAN) tablet 4 mg (has no administration in time range)    Or  ondansetron (ZOFRAN) injection 4 mg (has no administration in time range)  piperacillin-tazobactam (ZOSYN) IVPB 3.375 g (has no administration in time range)  sodium chloride 0.9 % bolus 1,000 mL (0 mLs Intravenous Stopped 10/15/22 1835)  dexamethasone (DECADRON) injection 10 mg (10 mg Intravenous Given 10/15/22 1534)  ketorolac (TORADOL) 15 MG/ML injection 15 mg (15 mg Intravenous Given 10/15/22 1533)  piperacillin-tazobactam (ZOSYN) IVPB 3.375 g (0 g Intravenous Stopped 10/15/22 1834)  iohexol (OMNIPAQUE) 300 MG/ML solution 75 mL (75 mLs Intravenous Contrast Given 10/15/22 1601)  lidocaine-EPINEPHrine (XYLOCAINE W/EPI) 2 %-1:100000 (with pres) injection 20 mL (20 mLs Infiltration Given by Other 10/15/22 1741)  LORazepam (ATIVAN) injection 1 mg (1 mg Intravenous Given 10/15/22 1834)     IMPRESSION / MDM / ASSESSMENT AND PLAN / ED COURSE  I reviewed the triage vital signs and the nursing notes.   Differential diagnosis includes, but is not limited to, peritonsillar abscess, tonsillar abscess, phlegmon, retropharyngeal abscess.  I reviewed the patient's chart.  Patient was seen on  09/27/2022 in the emergency department and found to have a 2.6 x 1.9 x 3.1 cm right palatine peritonsillar abscess.  She was admitted to the hospitalist service and her abscess was drained by Dr. Azucena Fallen.  She was discharged on Augmentin for 7 days.  Patient is awake and alert, hemodynamically stable and afebrile.  She does have uvular deviation concerning for a recurrence of her peritonsillar abscess.  Labs obtained are revealing for a leukocytosis to 13.5.  Strep today is negative as it was before.  CT soft tissue neck obtained for further evaluation of size and location of abscess.  In the meantime she was treated with Toradol, Decadron, and fluids after pregnancy confirmed negative.  I discussed with the pharmacist the proper antibiotic coverage given her previous culture of Fusobacterium Necrophorum.  CT reveals a slightly larger abscess in her right peritonsillar region with increased soft tissue stranding in the right parapharyngeal space with mass effect on the airway and retropharyngeal effusion without retropharyngeal abscess.  I discussed these findings with Dr. Jenean Lindau with ENT on-call who came to evaluate the patient at bedside.  He performed a bedside I&D with 6 cc collected.  This was sent for Gram stain and culture.  He agrees with plan for admission for overnight observation and airway monitoring.  Patient was accepted by Dr. Huel Cote.  Patient is also in agreement with this plan.   Patient's presentation is most consistent with acute presentation with potential threat to life or bodily function.   Clinical Course as of 10/15/22 1852  Tue Oct 15, 2022  1542 Discussed with pharmacist Marchelle Folks regarding proper antibiotic for Fusobacterium necrophorum which is what her previous culture grew out.  They feel that Unasyn, Zosyn, or ceftriaxone/Flagyl is appropriate. [JP]  [phone] Spoke with Dr. Okey Dupre with the ENT who will come to evaluate the patient [JP]    Clinical Course User Index [JP]  Miriam Kestler, Herb Grays, PA-C     FINAL CLINICAL IMPRESSION(S) / ED DIAGNOSES   Final diagnoses:  Peritonsillar abscess     Rx / DC Orders   ED Discharge Orders     None        Note:  This document was prepared using Dragon voice recognition software and may include unintentional dictation errors.   Keturah Shavers 10/15/22 1852    Merwyn Katos, MD 10/15/22 [phone]

## 2022-10-16 DIAGNOSIS — J36 Peritonsillar abscess: Secondary | ICD-10-CM | POA: Diagnosis not present

## 2022-10-16 MED ORDER — PROSIGHT PO TABS
1.0000 | ORAL_TABLET | Freq: Every day | ORAL | Status: DC
  Administered 2022-10-16 – 2022-10-18 (×3): 1 via ORAL
  Filled 2022-10-16 (×3): qty 1

## 2022-10-16 MED ORDER — RISAQUAD PO CAPS
2.0000 | ORAL_CAPSULE | Freq: Every day | ORAL | Status: DC
  Administered 2022-10-16 – 2022-10-18 (×3): 2 via ORAL
  Filled 2022-10-16 (×3): qty 2

## 2022-10-16 NOTE — Progress Notes (Signed)
PROGRESS NOTE  Colleen Chapman    DOB: Mar 21, 1995, 27 y.o.  WNU:[id number]    Code Status: Full Code   DOA: 10/15/2022   LOS: 1   Brief hospital course  Colleen Chapman is a 27 y.o. female with medical history significant of recent peritonsillar abscess presenting to the ED due to a sore throat.   [name] states that she completed her antibiotic course after her most recent over 1 week ago.  She was feeling well up until yesterday when she began to experience sore throat and pain on the right side of her mouth.  She endorses body aches and feeling irritable but otherwise denies any fevers, chills, shortness of breath, dysphagia or chest pain.  She notes that previously she was experiencing trouble breathing when she came in.   ED course: On arrival to the ED, patient was normotensive at 127/85 with heart rate of 83.  She is saturating at 100% on room air.  She was afebrile at 98.2. Initial workup notable for WBC of 13.5, and normal CMP.  Urine pregnancy test negative.  Group A strep negative.  CT of the neck was obtained that demonstrated enlargement of right peritonsillar abscess with increased soft tissue stranding in the right parapharyngeal space and retropharyngeal effusion.  ENT was consulted and bedside I&D was performed.  Patient started on Zosyn.   Patient was admitted to medicine service for further workup and management of peritonsillar abscess as outlined in detail below.  10/16/22 -improved significantly.   Assessment & Plan  Principal Problem:   Peritonsillar abscess Active Problems:   Depression  Peritonsillar abscess Patient was recently admitted for peritonsillar abscess s/p drainage by ENT with cultures growing Fusobacterium neck or forearm.  Now patient is presenting with increasing sore throat, uvular deviation and evidence of increased abscess on CT despite completing her antibiotic course. ENT was consulted and bedside I&D was performed.  Patient started on  Zosyn. Improved status in pain, voice.  - advance diet to regular per patient's request - Continue Zosyn per pharmacy dosing - f/u wound cultures to verify effectiveness of treatment prior to dc  Body mass index is 28.08 kg/m.  VTE ppx: enoxaparin (LOVENOX) injection 40 mg Start: 10/16/22 1000  Diet:     Diet   Diet full liquid Room service appropriate? Yes; Fluid consistency: Thin   Consultants: ENT  Subjective 10/16/22    Pt reports voice back to normal. Able to swallow without difficulty. No difficulty with breathing. Denies fever. Wants to take a shower, take her multi-vitamins and advance her diet.    Objective   Vitals:   10/15/22 2053 10/15/22 2132 10/16/22 0003 10/16/22 0429  BP: 129/78  103/65 120/62  Pulse: 100  84 80  Resp: 19  18 15   Temp: 97.8 F (36.6 C)  97.7 F (36.5 C) 97.7 F (36.5 C)  TempSrc: Oral  Oral Oral  SpO2: 98%  95% 100%  Weight:  78.9 kg    Height:        Intake/Output Summary (Last 24 hours) at 10/16/2022 0757 Last data filed at 10/16/2022 0300 Gross per 24 hour  Intake --  Output 0 ml  Net 0 ml   Filed Weights   10/15/22 1240 10/15/22 2132  Weight: 78.9 kg 78.9 kg     Physical Exam:  General: awake, alert, NAD HEENT: atraumatic, clear conjunctiva, anicteric sclera, MMM, hearing grossly normal. Mild erythema of pharynx. Uvula midline.  Respiratory: normal respiratory effort. Cardiovascular: quick capillary  refill Nervous: A&O x3. no gross focal neurologic deficits, normal speech Extremities: moves all equally, no edema, normal tone Skin: dry, intact, normal temperature, normal color. No rashes, lesions or ulcers on exposed skin Psychiatry: normal mood, congruent affect  Labs   I have personally reviewed the following labs and imaging studies CBC    Component Value Date/Time   WBC 13.5 (H) 10/15/2022 1445   RBC 4.60 10/15/2022 1445   HGB 14.1 10/15/2022 1445   HGB 14.3 05/31/2019 1119   HCT 41.7 10/15/2022 1445   HCT  42.9 05/31/2019 1119   PLT 287 10/15/2022 1445   PLT 238 05/31/2019 1119   MCV 90.7 10/15/2022 1445   MCV 88 12/30/2018 1030   MCH 30.7 10/15/2022 1445   MCHC 33.8 10/15/2022 1445   RDW 12.5 10/15/2022 1445   RDW 12.4 12/30/2018 1030   LYMPHSABS 1.5 10/15/2022 1445   LYMPHSABS 1.4 12/30/2018 1030   MONOABS 0.7 10/15/2022 1445   EOSABS 0.1 10/15/2022 1445   EOSABS 0.0 12/30/2018 1030   BASOSABS 0.0 10/15/2022 1445   BASOSABS 0.0 12/30/2018 1030      Latest Ref Rng & Units 10/15/2022    2:45 PM 09/30/2022    3:51 AM 09/29/2022    3:55 AM  BMP  Glucose 70 - 99 mg/dL 88  106  93   BUN 6 - 20 mg/dL 13  10  13    Creatinine 0.44 - 1.00 mg/dL 2.69  4.85  4.62   Sodium 135 - 145 mmol/L 137  138  138   Potassium 3.5 - 5.1 mmol/L 3.5  3.1  3.4   Chloride 98 - 111 mmol/L 103  102  104   CO2 22 - 32 mmol/L 25  27  26    Calcium 8.9 - 10.3 mg/dL 9.1  8.9  8.7     CT Soft Tissue Neck W Contrast  Result Date: 10/15/2022 CLINICAL DATA:  Epiglottitis or tonsillitis suspected EXAM: CT NECK WITH CONTRAST TECHNIQUE: Multidetector CT imaging of the neck was performed using the standard protocol following the bolus administration of intravenous contrast. RADIATION DOSE REDUCTION: This exam was performed according to the departmental dose-optimization program which includes automated exposure control, adjustment of the mA and/or kV according to patient size and/or use of iterative reconstruction technique. CONTRAST:  75mL OMNIPAQUE IOHEXOL 300 MG/ML  SOLN COMPARISON:  CT Neck 09/27/22 FINDINGS: Pharynx and larynx: There is a 2.8 x 1.7 x 3.2 cm right peritonsillar abscess with soft tissue stranding in the right parapharyngeal space. This abscess previously measured 2.5 x 1.8 x 3.1 Cm on 09/27/2022. The amount of soft tissue stranding in the parapharyngeal space is also increased. There is also slightly increased mass effect on the airway (series 2, image 31). Inflammatory changes extend inferiorly to the level  of the constrictors on the right. The epiglottis is normal in appearance. There is a retropharyngeal effusion without a retropharyngeal abscess. Salivary glands: No inflammation, mass, or stone. Thyroid: Normal. Lymph nodes: None enlarged or abnormal density. Vascular: Negative. Limited intracranial: Negative. Visualized orbits: Negative. Mastoids and visualized paranasal sinuses: Clear. Skeleton: No acute or aggressive process. Upper chest: Negative. Other: None. IMPRESSION: 1. Slight interval increase in size of the right peritonsillar abscess with increased soft tissue stranding in the right parapharyngeal space. There is also slightly increased mass effect on the airway. 2. Retropharyngeal effusion without a retropharyngeal abscess. Electronically Signed   By: Lorenza Cambridge M.D.   On: 10/15/2022 16:55    Disposition Plan &  Communication  Patient status: Inpatient  Admitted From: Home Planned disposition location: Home Anticipated discharge date: 8/15 pending culture results  Family Communication: none at bedside    Author: Leeroy Bock, DO [facility] 10/16/2022, 7:57 AM   Available by Epic secure chat 7AM-7PM. If 7PM-7AM, please contact night-coverage.  [facility] contact information found on [email or web address].

## 2022-10-17 DIAGNOSIS — J36 Peritonsillar abscess: Secondary | ICD-10-CM | POA: Diagnosis not present

## 2022-10-17 LAB — CBC
HCT: 37.1 % (ref 36.0–46.0)
Hemoglobin: 12.6 g/dL (ref 12.0–15.0)
MCH: 30.6 pg (ref 26.0–34.0)
MCHC: 34 g/dL (ref 30.0–36.0)
MCV: 90 fL (ref 80.0–100.0)
Platelets: 285 10*3/uL (ref 150–400)
RBC: 4.12 MIL/uL (ref 3.87–5.11)
RDW: 12.7 % (ref 11.5–15.5)
WBC: 7.5 10*3/uL (ref 4.0–10.5)
nRBC: 0 % (ref 0.0–0.2)

## 2022-10-17 NOTE — TOC CM/SW Note (Signed)
Patient does not have PCP listed in EPIC Per insurance card on file patient's assigned PCP through [facility] is [facility].  Patient will need to call Medicaid to request change in PCP if she wishes to establish care

## 2022-10-17 NOTE — Progress Notes (Signed)
PROGRESS NOTE  Colleen Chapman    DOB: [birth year], 27 y.o.  NUU:[id number]    Code Status: Full Code   DOA: 10/15/2022   LOS: 2   Brief hospital course  Colleen Chapman is a 27 y.o. female with medical history significant of recent peritonsillar abscess presenting to the ED due to a sore throat.   Colleen Chapman states that she completed her antibiotic course after her most recent over 1 week ago.  She was feeling well up until yesterday when she began to experience sore throat and pain on the right side of her mouth.  She endorses body aches and feeling irritable but otherwise denies any fevers, chills, shortness of breath, dysphagia or chest pain.  She notes that previously she was experiencing trouble breathing when she came in.   ED course: On arrival to the ED, patient was normotensive at 127/85 with heart rate of 83.  She is saturating at 100% on room air.  She was afebrile at 98.2. Initial workup notable for WBC of 13.5, and normal CMP.  Urine pregnancy test negative.  Group A strep negative.  CT of the neck was obtained that demonstrated enlargement of right peritonsillar abscess with increased soft tissue stranding in the right parapharyngeal space and retropharyngeal effusion.  ENT was consulted and bedside I&D was performed.  Patient started on Zosyn.   Patient was admitted to medicine service for further workup and management of peritonsillar abscess as outlined in detail below.  10/17/22 -improved significantly.   Assessment & Plan  Principal Problem:   Peritonsillar abscess Active Problems:   Depression  Peritonsillar abscess Patient was recently admitted for peritonsillar abscess s/p drainage by ENT with cultures growing Fusobacterium necrophorum.  Now patient is presenting with increasing sore throat, uvular deviation and evidence of increased abscess on CT despite completing her antibiotic course. ENT was consulted and bedside I&D was performed.  Patient started on  Zosyn. Improved status in pain, voice.  Cultures again are growing fusobacterium. Being reincubated for better growth - Continue Zosyn per pharmacy dosing - f/u wound cultures to verify effectiveness of treatment prior to dc  Body mass index is 28.08 kg/m.  VTE ppx: enoxaparin (LOVENOX) injection 40 mg Start: 10/16/22 1000  Diet:     Diet   Diet regular Fluid consistency: Thin   Consultants: ENT  Subjective 10/17/22    Colleen Chapman reports having no difficulty with respiration or swallowing. She has pain intermittently when pain medications wear off. She is worried about the infection coming back and having her tonsils taken out.    Objective   Vitals:   10/16/22 0814 10/16/22 1113 10/16/22 2059 10/17/22 0436  BP: 115/75 134/82 106/60 (!) 104/56  Pulse: 86 80 91 71  Resp: 17 16 20 20   Temp: 98.1 F (36.7 C) 97.7 F (36.5 C) 97.7 F (36.5 C) 97.6 F (36.4 C)  TempSrc:  Oral Oral Oral  SpO2: 100% 100% 98% 99%  Weight:      Height:        Intake/Output Summary (Last 24 hours) at 10/17/2022 0735 Last data filed at 10/16/2022 1938 Gross per 24 hour  Intake 878.33 ml  Output --  Net 878.33 ml   Filed Weights   10/15/22 1240 10/15/22 2132  Weight: 78.9 kg 78.9 kg     Physical Exam:  General: awake, alert, NAD HEENT: atraumatic, clear conjunctiva, anicteric sclera, MMM, hearing grossly normal. Mild erythema of pharynx. Uvula midline.  Respiratory: normal respiratory effort. Cardiovascular: quick  capillary refill Nervous: A&O x3. no gross focal neurologic deficits, normal speech Extremities: moves all equally, no edema, normal tone Skin: dry, intact, normal temperature, normal color. No rashes, lesions or ulcers on exposed skin Psychiatry: normal mood, congruent affect  Labs   I have personally reviewed the following labs and imaging studies CBC    Component Value Date/Time   WBC 7.5 10/17/2022 0354   RBC 4.12 10/17/2022 0354   HGB 12.6 10/17/2022 0354   HGB 14.3  05/31/2019 1119   HCT 37.1 10/17/2022 0354   HCT 42.9 05/31/2019 1119   PLT 285 10/17/2022 0354   PLT 238 05/31/2019 1119   MCV 90.0 10/17/2022 0354   MCV 88 12/30/2018 1030   MCH 30.6 10/17/2022 0354   MCHC 34.0 10/17/2022 0354   RDW 12.7 10/17/2022 0354   RDW 12.4 12/30/2018 1030   LYMPHSABS 1.5 10/15/2022 1445   LYMPHSABS 1.4 12/30/2018 1030   MONOABS 0.7 10/15/2022 1445   EOSABS 0.1 10/15/2022 1445   EOSABS 0.0 12/30/2018 1030   BASOSABS 0.0 10/15/2022 1445   BASOSABS 0.0 12/30/2018 1030      Latest Ref Rng & Units 10/15/2022    2:45 PM 09/30/2022    3:51 AM 09/29/2022    3:55 AM  BMP  Glucose 70 - 99 mg/dL 88  962  93   BUN 6 - 20 mg/dL 13  10  13    Creatinine 0.44 - 1.00 mg/dL 9.52  8.41  3.24   Sodium 135 - 145 mmol/L 137  138  138   Potassium 3.5 - 5.1 mmol/L 3.5  3.1  3.4   Chloride 98 - 111 mmol/L 103  102  104   CO2 22 - 32 mmol/L 25  27  26    Calcium 8.9 - 10.3 mg/dL 9.1  8.9  8.7     CT Soft Tissue Neck W Contrast  Result Date: 10/15/2022 CLINICAL DATA:  Epiglottitis or tonsillitis suspected EXAM: CT NECK WITH CONTRAST TECHNIQUE: Multidetector CT imaging of the neck was performed using the standard protocol following the bolus administration of intravenous contrast. RADIATION DOSE REDUCTION: This exam was performed according to the departmental dose-optimization program which includes automated exposure control, adjustment of the mA and/or kV according to patient size and/or use of iterative reconstruction technique. CONTRAST:  75mL OMNIPAQUE IOHEXOL 300 MG/ML  SOLN COMPARISON:  CT Neck 09/27/22 FINDINGS: Pharynx and larynx: There is a 2.8 x 1.7 x 3.2 cm right peritonsillar abscess with soft tissue stranding in the right parapharyngeal space. This abscess previously measured 2.5 x 1.8 x 3.1 Cm on 09/27/2022. The amount of soft tissue stranding in the parapharyngeal space is also increased. There is also slightly increased mass effect on the airway (series 2, image 31).  Inflammatory changes extend inferiorly to the level of the constrictors on the right. The epiglottis is normal in appearance. There is a retropharyngeal effusion without a retropharyngeal abscess. Salivary glands: No inflammation, mass, or stone. Thyroid: Normal. Lymph nodes: None enlarged or abnormal density. Vascular: Negative. Limited intracranial: Negative. Visualized orbits: Negative. Mastoids and visualized paranasal sinuses: Clear. Skeleton: No acute or aggressive process. Upper chest: Negative. Other: None. IMPRESSION: 1. Slight interval increase in size of the right peritonsillar abscess with increased soft tissue stranding in the right parapharyngeal space. There is also slightly increased mass effect on the airway. 2. Retropharyngeal effusion without a retropharyngeal abscess. Electronically Signed   By: Lorenza Cambridge M.D.   On: 10/15/2022 16:55    Disposition Plan &  Communication  Patient status: Inpatient  Admitted From: Home Planned disposition location: Home Anticipated discharge date: 8/16 pending culture results  Family Communication: none at bedside    Author: Leeroy Bock, DO [facility] 10/17/2022, 7:35 AM   Available by Epic secure chat 7AM-7PM. If 7PM-7AM, please contact night-coverage.  [facility] contact information found on [email or web address].

## 2022-10-18 DIAGNOSIS — J36 Peritonsillar abscess: Secondary | ICD-10-CM | POA: Diagnosis not present

## 2022-10-18 MED ORDER — METRONIDAZOLE 500 MG PO TABS: 500.0000 mg | ORAL_TABLET | Freq: Three times a day (TID) | ORAL | 0 refills | Status: DC

## 2022-10-18 MED ORDER — METRONIDAZOLE 500 MG PO TABS: 500.0000 mg | ORAL_TABLET | Freq: Three times a day (TID) | ORAL | 0 refills | Status: AC

## 2022-10-18 MED ORDER — ACETAMINOPHEN 325 MG PO TABS: 650.0000 mg | ORAL_TABLET | Freq: Four times a day (QID) | ORAL | 0 refills | Status: DC | PRN

## 2022-10-18 NOTE — Discharge Summary (Signed)
Physician Discharge Summary  Patient: Colleen Chapman YQM:[id number] DOB: 1995-04-07   Code Status: Full Code Admit date: 10/15/2022 Discharge date: 10/18/2022 Disposition: Home, No home health services recommended PCP: Pcp, No  Recommendations for Outpatient Follow-up:  Follow up with PCP within 1-2 weeks Regarding general hospital follow up and preventative care Follow up with ENT as soon as possible Discuss tonsillectomy  Discharge Diagnoses:  Principal Problem:   Peritonsillar abscess Active Problems:   Depression  Brief Hospital Course Summary: Colleen Chapman is a 27 y.o. female with medical history significant of recent peritonsillar abscess (July) They presented to the ED due to a sore throat.   [name] states that she completed her antibiotic course over 1 week ago.  She was feeling well up until yesterday when she began to experience sore throat and pain on the right side of her mouth.  She endorses body aches and feeling irritable but otherwise denies any fevers, chills, shortness of breath, dysphagia or chest pain.  She notes that previously she was experiencing trouble breathing when she came in. Of note, she works with children.    ED course: On arrival to the ED, patient was normotensive at 127/85 with heart rate of 83.  She is saturating at 100% on room air.  She was afebrile at 98.2. Initial workup notable for WBC of 13.5, and normal CMP.  Urine pregnancy test negative.  Group A strep negative.  CT of the neck was obtained that demonstrated enlargement of right peritonsillar abscess with increased soft tissue stranding in the right parapharyngeal space and retropharyngeal effusion.  ENT was consulted and bedside I&D was performed.  Patient started on Zosyn.    Patient was admitted to medicine service for further workup and management of peritonsillar abscess s/p I&D and continued on IV zosyn. On day 1 of admission, she was having no difficulty with  swallowing or breathing. Continued to have soreness in throat when pain medications wore off. She remained afebrile.  Aspiration culture grew Fusobacterium necrophorum (same as last admission).  She was transitioned to flagyl to complete 2 week course at home and instructed her to have close ENT follow up.  If reoccurrence happens again, consider immune workup vs lemierre  syndrome.   Work note written.  Discharge Condition: Good, improved Recommended discharge diet: Regular healthy diet  Consultations: ENT  Procedures/Studies: Aspiration of peritonsillar abscess  Discharge Instructions     Ambulatory referral to ENT   Complete by: As directed    Referral to ENT for recurrent peritonsillar abscess and evaluation for tonsillectomy  Specific provider or location desired?  Dr Okey Dupre, [place] Put in checkout note to send to referral coordinator for all locations except [facility].  [facility] will contact the family directly to schedule the referral.      Allergies as of 10/18/2022       Reactions   Bactrim [sulfamethoxazole-trimethoprim] Rash        Medication List     TAKE these medications    acetaminophen 325 MG tablet Commonly known as: TYLENOL Take 2 tablets (650 mg total) by mouth every 6 (six) hours as needed for mild pain (or Fever >/= 101).   metroNIDAZOLE 500 MG tablet Commonly known as: Flagyl Take 1 tablet (500 mg total) by mouth 3 (three) times daily for 12 days.        Follow-up Information     Lanell Persons, MD. Schedule an appointment as soon as possible for a visit today.  Specialty: Otolaryngology Contact information: [address] Laurell [address] [place] [address] [phone]                 Subjective   Pt reports feeling improved. Soreness is minimal. No respiratory concerns and no dysphagia. She has concerns about being out of work and states she plans to go back to work extra hours and make up for her time in the hospital.   All  questions and concerns were addressed at time of discharge.  Objective  Blood pressure 114/62, pulse 67, temperature 98.2 F (36.8 C), temperature source Oral, resp. rate 16, height 5\' 6"  (1.676 m), weight 78.9 kg, SpO2 100%.   General: Pt is alert, awake, not in acute distress HEENT: mild erythema or pharynx. Normal voice. No exudates observed.  Cardiovascular: RRR, S1/S2 +, no rubs, no gallops Respiratory: CTA bilaterally, no wheezing, no rhonchi Abdominal: Soft, NT, ND, bowel sounds + Extremities: no edema, no cyanosis  The results of significant diagnostics from this hospitalization (including imaging, microbiology, ancillary and laboratory) are listed below for reference.   Imaging studies: CT Soft Tissue Neck W Contrast  Result Date: 10/15/2022 CLINICAL DATA:  Epiglottitis or tonsillitis suspected EXAM: CT NECK WITH CONTRAST TECHNIQUE: Multidetector CT imaging of the neck was performed using the standard protocol following the bolus administration of intravenous contrast. RADIATION DOSE REDUCTION: This exam was performed according to the departmental dose-optimization program which includes automated exposure control, adjustment of the mA and/or kV according to patient size and/or use of iterative reconstruction technique. CONTRAST:  75mL OMNIPAQUE IOHEXOL 300 MG/ML  SOLN COMPARISON:  CT Neck 09/27/22 FINDINGS: Pharynx and larynx: There is a 2.8 x 1.7 x 3.2 cm right peritonsillar abscess with soft tissue stranding in the right parapharyngeal space. This abscess previously measured 2.5 x 1.8 x 3.1 Cm on 09/27/2022. The amount of soft tissue stranding in the parapharyngeal space is also increased. There is also slightly increased mass effect on the airway (series 2, image 31). Inflammatory changes extend inferiorly to the level of the constrictors on the right. The epiglottis is normal in appearance. There is a retropharyngeal effusion without a retropharyngeal abscess. Salivary glands: No  inflammation, mass, or stone. Thyroid: Normal. Lymph nodes: None enlarged or abnormal density. Vascular: Negative. Limited intracranial: Negative. Visualized orbits: Negative. Mastoids and visualized paranasal sinuses: Clear. Skeleton: No acute or aggressive process. Upper chest: Negative. Other: None. IMPRESSION: 1. Slight interval increase in size of the right peritonsillar abscess with increased soft tissue stranding in the right parapharyngeal space. There is also slightly increased mass effect on the airway. 2. Retropharyngeal effusion without a retropharyngeal abscess. Electronically Signed   By: Lorenza Cambridge M.D.   On: 10/15/2022 16:55   CT Soft Tissue Neck W Contrast  Result Date: 09/27/2022 CLINICAL DATA:  Tonsillitis EXAM: CT NECK WITH CONTRAST TECHNIQUE: Multidetector CT imaging of the neck was performed using the standard protocol following the bolus administration of intravenous contrast. RADIATION DOSE REDUCTION: This exam was performed according to the departmental dose-optimization program which includes automated exposure control, adjustment of the mA and/or kV according to patient size and/or use of iterative reconstruction technique. CONTRAST:  75mL OMNIPAQUE IOHEXOL 300 MG/ML  SOLN COMPARISON:  None Available. FINDINGS: Pharynx and larynx: The palatine and adenoid tonsils are enlarged. There is a 2.6 x 1.9 x 3.1 cm right palatine peritonsillar abscess. Small amount of right asymmetric right retropharyngeal fluid. No retropharyngeal abscess. The epiglottis and larynx are normal. Salivary glands: No inflammation, mass,  or stone. Thyroid: Normal. Lymph nodes: Bilateral reactive cervical lymph nodes. Vascular: Negative. Limited intracranial: Negative. Visualized orbits: Negative. Mastoids and visualized paranasal sinuses: Clear. Skeleton: No acute or aggressive process. Reversal of normal cervical lordosis. Upper chest: Negative. Other: None. IMPRESSION: 1. Acute tonsillopharyngitis with 2.6 x  1.9 x 3.1 cm right palatine peritonsillar abscess. 2. Small amount of right asymmetric retropharyngeal fluid, likely reactive. No retropharyngeal abscess. 3. Reactive cervical adenopathy. Electronically Signed   By: Deatra Robinson M.D.   On: 09/27/2022 22:27    Labs: Basic Metabolic Panel: Recent Labs  Lab 10/15/22 1445  NA 137  K 3.5  CL 103  CO2 25  GLUCOSE 88  BUN 13  CREATININE 0.72  CALCIUM 9.1   CBC: Recent Labs  Lab 10/15/22 1445 10/17/22 0354  WBC 13.5* 7.5  NEUTROABS 11.2*  --   HGB 14.1 12.6  HCT 41.7 37.1  MCV 90.7 90.0  PLT 287 285   Microbiology: Results for orders placed or performed during the hospital encounter of 10/15/22  Group A Strep by PCR ([facility] Only)     Status: None   Collection Time: 10/15/22  2:47 PM   Specimen: Throat; Sterile Swab  Result Value Ref Range Status   Group A Strep by PCR NOT DETECTED NOT DETECTED Final    Comment: Performed at [facility], [address] [place], [place] [address]  Body fluid culture w Gram Stain     Status: None   Collection Time: 10/15/22  6:50 PM   Specimen: Path fluid; Body Fluid  Result Value Ref Range Status   Specimen Description   Final    FLUID Performed at [facility], [address] [place], [place] [address]    Special Requests   Final    RIGHT TONSIL Performed at [facility], [address] [place], [place] [address]    Gram Stain   Final    MODERATE WBC PRESENT,BOTH PMN AND MONONUCLEAR RARE GRAM NEGATIVE RODS    Culture   Final    ABUNDANT FUSOBACTERIUM NECROPHORUM BETA LACTAMASE NEGATIVE FEW Gemella haemolysans Standardized susceptibility testing for this organism is not available. Performed at [facility], [address], [place], [place] [address]    Report Status 10/18/2022 FINAL  Final   Time coordinating discharge: Over 30 minutes  Leeroy Bock, MD  [facility] 10/18/2022, 12:39 PM

## 2022-10-18 NOTE — Discharge Instructions (Signed)
Your antibiotics have been sent to the pharmacy and should be taken three times per day until completed.  Call ENT today to make an appointment within a week so they can monitor your healing and discuss possible tonsillectomy.  Return to the hospital if you have repeat swelling that is impacting your swallowing or breathing

## 2022-10-18 NOTE — Progress Notes (Incomplete)
PROGRESS NOTE  Colleen Chapman    DOB: 29-Mar-1995, 27 y.o.  ZOX:[id number]    Code Status: Full Code   DOA: 10/15/2022   LOS: 3   Brief hospital course  Colleen Chapman is a 27 y.o. female with medical history significant of recent peritonsillar abscess presenting to the ED due to a sore throat.   Colleen Chapman states that she completed her antibiotic course after her most recent over 1 week ago.  She was feeling well up until yesterday when she began to experience sore throat and pain on the right side of her mouth.  She endorses body aches and feeling irritable but otherwise denies any fevers, chills, shortness of breath, dysphagia or chest pain.  She notes that previously she was experiencing trouble breathing when she came in.   ED course: On arrival to the ED, patient was normotensive at 127/85 with heart rate of 83.  She is saturating at 100% on room air.  She was afebrile at 98.2. Initial workup notable for WBC of 13.5, and normal CMP.  Urine pregnancy test negative.  Group A strep negative.  CT of the neck was obtained that demonstrated enlargement of right peritonsillar abscess with increased soft tissue stranding in the right parapharyngeal space and retropharyngeal effusion.  ENT was consulted and bedside I&D was performed.  Patient started on Zosyn.   Patient was admitted to medicine service for further workup and management of peritonsillar abscess as outlined in detail below.  10/18/22 -improved significantly.   Assessment & Plan  Principal Problem:   Peritonsillar abscess Active Problems:   Depression  Peritonsillar abscess Patient was recently admitted for peritonsillar abscess s/p drainage by ENT with cultures growing Fusobacterium necrophorum.  Now patient is presenting with increasing sore throat, uvular deviation and evidence of increased abscess on CT despite completing her antibiotic course. ENT was consulted and bedside I&D was performed.  Patient started on  Zosyn. Improved status in pain, voice.  Cultures again are growing fusobacterium. Being reincubated for better growth - Continue Zosyn per pharmacy dosing - f/u wound cultures to verify effectiveness of treatment prior to dc  Body mass index is 28.08 kg/m.  VTE ppx: enoxaparin (LOVENOX) injection 40 mg Start: 10/16/22 1000  Diet:     Diet   Diet regular Fluid consistency: Thin   Consultants: ENT  Subjective 10/18/22    Pt reports having no difficulty with respiration or swallowing. She has pain intermittently when pain medications wear off. She is worried about the infection coming back and having her tonsils taken out.    Objective   Vitals:   10/17/22 0436 10/17/22 0930 10/17/22 2026 10/18/22 0508  BP: (!) 104/56 106/71 103/64 (!) 109/52  Pulse: 71 64 68 61  Resp: 20 12 20 18   Temp: 97.6 F (36.4 C) 98 F (36.7 C) (!) 97.5 F (36.4 C) 97.8 F (36.6 C)  TempSrc: Oral  Oral Oral  SpO2: 99% 97% 99% 100%  Weight:      Height:        Intake/Output Summary (Last 24 hours) at 10/18/2022 0735 Last data filed at 10/17/2022 2345 Gross per 24 hour  Intake 238 ml  Output --  Net 238 ml   Filed Weights   10/15/22 1240 10/15/22 2132  Weight: 78.9 kg 78.9 kg     Physical Exam:  General: awake, alert, NAD HEENT: atraumatic, clear conjunctiva, anicteric sclera, MMM, hearing grossly normal. Mild erythema of pharynx. Uvula midline.  Respiratory: normal respiratory effort.  Cardiovascular: quick capillary refill Nervous: A&O x3. no gross focal neurologic deficits, normal speech Extremities: moves all equally, no edema, normal tone Skin: dry, intact, normal temperature, normal color. No rashes, lesions or ulcers on exposed skin Psychiatry: normal mood, congruent affect  Labs   I have personally reviewed the following labs and imaging studies CBC    Component Value Date/Time   WBC 7.5 10/17/2022 0354   RBC 4.12 10/17/2022 0354   HGB 12.6 10/17/2022 0354   HGB 14.3  05/31/2019 1119   HCT 37.1 10/17/2022 0354   HCT 42.9 05/31/2019 1119   PLT 285 10/17/2022 0354   PLT 238 05/31/2019 1119   MCV 90.0 10/17/2022 0354   MCV 88 12/30/2018 1030   MCH 30.6 10/17/2022 0354   MCHC 34.0 10/17/2022 0354   RDW 12.7 10/17/2022 0354   RDW 12.4 12/30/2018 1030   LYMPHSABS 1.5 10/15/2022 1445   LYMPHSABS 1.4 12/30/2018 1030   MONOABS 0.7 10/15/2022 1445   EOSABS 0.1 10/15/2022 1445   EOSABS 0.0 12/30/2018 1030   BASOSABS 0.0 10/15/2022 1445   BASOSABS 0.0 12/30/2018 1030      Latest Ref Rng & Units 10/15/2022    2:45 PM 09/30/2022    3:51 AM 09/29/2022    3:55 AM  BMP  Glucose 70 - 99 mg/dL 88  782  93   BUN 6 - 20 mg/dL 13  10  13    Creatinine 0.44 - 1.00 mg/dL 9.56  2.13  0.86   Sodium 135 - 145 mmol/L 137  138  138   Potassium 3.5 - 5.1 mmol/L 3.5  3.1  3.4   Chloride 98 - 111 mmol/L 103  102  104   CO2 22 - 32 mmol/L 25  27  26    Calcium 8.9 - 10.3 mg/dL 9.1  8.9  8.7     No results found.  Disposition Plan & Communication  Patient status: Inpatient  Admitted From: Home Planned disposition location: Home Anticipated discharge date: 8/16 pending culture results  Family Communication: none at bedside    Author: Leeroy Bock, DO [facility] 10/18/2022, 7:35 AM   Available by Epic secure chat 7AM-7PM. If 7PM-7AM, please contact night-coverage.  [facility] contact information found on [email or web address].

## 2022-10-18 NOTE — TOC CM/SW Note (Signed)
Transition of Care [facility]) - Inpatient Brief Assessment   Patient Details  Name: Colleen Chapman MRN: [id number] Date of Birth: 1996-01-23  Transition of Care [facility]) CM/SW Contact:    Margarito Liner, LCSW Phone Number: 10/18/2022, 12:42 PM   Clinical Narrative: Patient has orders to discharge home today. Chart reviewed. No TOC needs identified. CSW signing off.  Transition of Care Asessment: Insurance and Status: Insurance coverage has been reviewed Patient has primary care physician: No Home environment has been reviewed: Single family home Prior level of function:: Not documented Prior/Current Home Services: No current home services Social Determinants of Health Reivew: SDOH reviewed no interventions necessary Readmission risk has been reviewed: Yes Transition of care needs: no transition of care needs at this time

## 2022-10-18 NOTE — Plan of Care (Signed)
  Problem: Clinical Measurements: Goal: Diagnostic test results will improve Outcome: Progressing Goal: Signs and symptoms of infection will decrease Outcome: Progressing   Problem: Respiratory: Goal: Ability to maintain adequate ventilation will improve Outcome: Progressing   Problem: Pain Managment: Goal: General experience of comfort will improve Outcome: Progressing   Problem: Safety: Goal: Ability to remain free from injury will improve Outcome: Progressing

## 2022-10-18 NOTE — Progress Notes (Signed)
Discharged. AVS printed and reviewed. All belongings gathered

## 2022-11-03 DIAGNOSIS — J36 Peritonsillar abscess: Secondary | ICD-10-CM

## 2022-11-03 HISTORY — DX: Peritonsillar abscess: J36

## 2022-11-06 ENCOUNTER — Encounter: Payer: Self-pay | Admitting: Emergency Medicine

## 2022-11-06 ENCOUNTER — Emergency Department: Payer: Medicaid Other

## 2022-11-06 ENCOUNTER — Other Ambulatory Visit: Payer: Self-pay

## 2022-11-06 ENCOUNTER — Emergency Department
Admission: EM | Admit: 2022-11-06 | Discharge: 2022-11-06 | Disposition: A | Discharge status: Home / Self Care | Payer: Medicaid Other | Attending: Emergency Medicine | Admitting: Emergency Medicine

## 2022-11-06 DIAGNOSIS — J020 Streptococcal pharyngitis: Secondary | ICD-10-CM | POA: Insufficient documentation

## 2022-11-06 DIAGNOSIS — J384 Edema of larynx: Secondary | ICD-10-CM | POA: Diagnosis not present

## 2022-11-06 DIAGNOSIS — J039 Acute tonsillitis, unspecified: Secondary | ICD-10-CM | POA: Diagnosis not present

## 2022-11-06 DIAGNOSIS — J351 Hypertrophy of tonsils: Secondary | ICD-10-CM | POA: Diagnosis not present

## 2022-11-06 DIAGNOSIS — R590 Localized enlarged lymph nodes: Secondary | ICD-10-CM | POA: Diagnosis not present

## 2022-11-06 DIAGNOSIS — J36 Peritonsillar abscess: Secondary | ICD-10-CM | POA: Diagnosis not present

## 2022-11-06 DIAGNOSIS — J029 Acute pharyngitis, unspecified: Secondary | ICD-10-CM | POA: Diagnosis present

## 2022-11-06 LAB — CBC WITH DIFFERENTIAL/PLATELET
Abs Immature Granulocytes: 0.04 10*3/uL (ref 0.00–0.07)
Basophils Absolute: 0 10*3/uL (ref 0.0–0.1)
Basophils Relative: 0 %
Eosinophils Absolute: 0 10*3/uL (ref 0.0–0.5)
Eosinophils Relative: 0 %
HCT: 46.4 % — ABNORMAL HIGH (ref 36.0–46.0)
Hemoglobin: 15.4 g/dL — ABNORMAL HIGH (ref 12.0–15.0)
Immature Granulocytes: 0 %
Lymphocytes Relative: 9 %
Lymphs Abs: 1.1 10*3/uL (ref 0.7–4.0)
MCH: 30.7 pg (ref 26.0–34.0)
MCHC: 33.2 g/dL (ref 30.0–36.0)
MCV: 92.4 fL (ref 80.0–100.0)
Monocytes Absolute: 0.8 10*3/uL (ref 0.1–1.0)
Monocytes Relative: 6 %
Neutro Abs: 10.6 10*3/uL — ABNORMAL HIGH (ref 1.7–7.7)
Neutrophils Relative %: 85 %
Platelets: 287 10*3/uL (ref 150–400)
RBC: 5.02 MIL/uL (ref 3.87–5.11)
RDW: 12.9 % (ref 11.5–15.5)
WBC: 12.6 10*3/uL — ABNORMAL HIGH (ref 4.0–10.5)
nRBC: 0 % (ref 0.0–0.2)

## 2022-11-06 LAB — HCG, QUANTITATIVE, PREGNANCY: hCG, Beta Chain, Quant, S: 1 m[IU]/mL (ref <5)

## 2022-11-06 LAB — GROUP A STREP BY PCR: Group A Strep by PCR: DETECTED — AB

## 2022-11-06 LAB — COMPREHENSIVE METABOLIC PANEL
ALT: 21 U/L (ref 0–44)
AST: 18 U/L (ref 15–41)
Albumin: 4.4 g/dL (ref 3.5–5.0)
Alkaline Phosphatase: 54 U/L (ref 38–126)
Anion gap: 11 (ref 5–15)
BUN: 12 mg/dL (ref 6–20)
CO2: 24 mmol/L (ref 22–32)
Calcium: 9.3 mg/dL (ref 8.9–10.3)
Chloride: 101 mmol/L (ref 98–111)
Creatinine, Ser: 0.7 mg/dL (ref 0.44–1.00)
GFR, Estimated: >60 mL/min (ref >60)
Glucose, Bld: 92 mg/dL (ref 70–99)
Potassium: 3.4 mmol/L — ABNORMAL LOW (ref 3.5–5.1)
Sodium: 136 mmol/L (ref 135–145)
Total Bilirubin: 1.1 mg/dL (ref 0.3–1.2)
Total Protein: 7.9 g/dL (ref 6.5–8.1)

## 2022-11-06 LAB — POC URINE PREG, ED: Preg Test, Ur: NEGATIVE

## 2022-11-06 MED ORDER — IOHEXOL 300 MG/ML  SOLN
75.0000 mL | Freq: Once | INTRAMUSCULAR | Status: AC | PRN
  Administered 2022-11-06: 75 mL via INTRAVENOUS

## 2022-11-06 MED ORDER — BENZOCAINE 20 % MT SOLN
Freq: Once | OROMUCOSAL | Status: AC
  Administered 2022-11-06: 1 via OROMUCOSAL
  Filled 2022-11-06: qty 1

## 2022-11-06 MED ORDER — HYDROCODONE-ACETAMINOPHEN 7.5-325 MG/15ML PO SOLN
10.0000 mL | Freq: Three times a day (TID) | ORAL | 0 refills | Status: DC
Start: 2022-11-06 — End: 2022-11-06

## 2022-11-06 MED ORDER — SODIUM CHLORIDE 0.9 % IV BOLUS
1000.0000 mL | Freq: Once | INTRAVENOUS | Status: AC
  Administered 2022-11-06: 1000 mL via INTRAVENOUS

## 2022-11-06 MED ORDER — KETOROLAC TROMETHAMINE 30 MG/ML IJ SOLN
30.0000 mg | Freq: Once | INTRAMUSCULAR | Status: AC
  Administered 2022-11-06: 30 mg via INTRAVENOUS
  Filled 2022-11-06: qty 1

## 2022-11-06 MED ORDER — SODIUM CHLORIDE 0.9 % IV SOLN
3.0000 g | Freq: Once | INTRAVENOUS | Status: AC
  Administered 2022-11-06: 3 g via INTRAVENOUS
  Filled 2022-11-06: qty 8

## 2022-11-06 MED ORDER — AMOXICILLIN-POT CLAVULANATE 875-125 MG PO TABS
1.0000 | ORAL_TABLET | Freq: Two times a day (BID) | ORAL | 0 refills | Status: DC
Start: 2022-11-06 — End: 2022-11-06

## 2022-11-06 MED ORDER — LIDOCAINE-EPINEPHRINE 1 %-1:100000 IJ SOLN
20.0000 mL | Freq: Once | INTRAMUSCULAR | Status: AC
  Administered 2022-11-06: 20 mL via INTRADERMAL
  Filled 2022-11-06: qty 1

## 2022-11-06 MED ORDER — MORPHINE SULFATE (PF) 4 MG/ML IV SOLN
4.0000 mg | Freq: Once | INTRAVENOUS | Status: AC
  Administered 2022-11-06: 4 mg via INTRAVENOUS
  Filled 2022-11-06: qty 1

## 2022-11-06 MED ORDER — DEXAMETHASONE SODIUM PHOSPHATE 10 MG/ML IJ SOLN
10.0000 mg | Freq: Once | INTRAMUSCULAR | Status: AC
  Administered 2022-11-06: 10 mg via INTRAVENOUS
  Filled 2022-11-06: qty 1

## 2022-11-06 MED ORDER — AMOXICILLIN-POT CLAVULANATE 875-125 MG PO TABS: 1.0000 | ORAL_TABLET | Freq: Two times a day (BID) | ORAL | 0 refills | Status: AC

## 2022-11-06 MED ORDER — HYDROCODONE-ACETAMINOPHEN 7.5-325 MG/15ML PO SOLN
10.0000 mL | Freq: Three times a day (TID) | ORAL | 0 refills | Status: AC
Start: 2022-11-06 — End: 2022-11-09

## 2022-11-06 MED ORDER — SODIUM CHLORIDE 0.9 % IV SOLN: 3.0000 g | Freq: Once | INTRAVENOUS | Status: DC

## 2022-11-06 NOTE — Discharge Instructions (Addendum)
Take the antibiotic as directed.  Take the pain medicine as needed.  Take OTC ibuprofen for nondrowsy pain relief.  Drink Liquigel prevent dehydration.  Follow-up with Dr. Okey Dupre on Friday as scheduled.

## 2022-11-06 NOTE — ED Provider Notes (Signed)
[facility] Provider Note    Event Date/Time   First MD Initiated Contact with Patient 11/06/22 1243     (approximate)   History   Sore Throat and Possible Pregnancy   HPI  Colleen Chapman is a 27 y.o. female PMH of ADHD and depression who presents for evaluation of sore throat and possible pregnancy.  Patient's LMP was August 9. Patient has been treated by this ED for peritonsillar abscess on 7/26, was admitted, had I&D and IV abx. She returned to the ED on 8/13 for the same and was admitted again. Today patient woke up with right sided throat pain and swelling similar to what she felt before. She states she did have symptom resolution between each of her presentations for abscess and she completed all of her oral antibiotics.       Physical Exam   Triage Vital Signs: ED Triage Vitals  Encounter Vitals Group     BP 11/06/22 1209 (!) 147/81     Systolic BP Percentile --      Diastolic BP Percentile --      Pulse Rate 11/06/22 1209 81     Resp 11/06/22 1209 18     Temp 11/06/22 1209 98.9 F (37.2 C)     Temp Source 11/06/22 1209 Oral     SpO2 11/06/22 1209 100 %     Weight 11/06/22 1210 174 lb (78.9 kg)     Height 11/06/22 1210 5\' 6"  (1.676 m)     Head Circumference --      Peak Flow --      Pain Score 11/06/22 1210 10     Pain Loc --      Pain Education --      Exclude from Growth Chart --     Most recent vital signs: Vitals:   11/06/22 1209  BP: (!) 147/81  Pulse: 81  Resp: 18  Temp: 98.9 F (37.2 C)  SpO2: 100%    General: Awake, no distress. Voice hoarseness. CV:  Good peripheral perfusion. RRR. Resp:  Normal effort. CTAB. Tolerating secretions.  Abd:  No distention.  Other:  Oral mucous moist, pharyngeal erythema, mild tonsillar enlargement, uvula is midline. Tender cervical lymphadenopathy.   ED Results / Procedures / Treatments   Labs (all labs ordered are listed, but only abnormal results are displayed) Labs  Reviewed  GROUP A STREP BY PCR - Abnormal; Notable for the following components:      Result Value   Group A Strep by PCR DETECTED (*)    All other components within normal limits  CBC WITH DIFFERENTIAL/PLATELET - Abnormal; Notable for the following components:   WBC 12.6 (*)    Hemoglobin 15.4 (*)    HCT 46.4 (*)    Neutro Abs 10.6 (*)    All other components within normal limits  COMPREHENSIVE METABOLIC PANEL - Abnormal; Notable for the following components:   Potassium 3.4 (*)    All other components within normal limits  HCG, QUANTITATIVE, PREGNANCY  POC URINE PREG, ED     RADIOLOGY  CT soft tissue of the neck obtained, interpreted the images as well as reviewed the radiologist report.   PROCEDURES:  Critical Care performed: No  Procedures   MEDICATIONS ORDERED IN ED: Medications  sodium chloride 0.9 % bolus 1,000 mL (1,000 mLs Intravenous New Bag/Given 11/06/22 1351)  dexamethasone (DECADRON) injection 10 mg (10 mg Intravenous Given 11/06/22 1355)  morphine (PF) 4 MG/ML injection 4 mg (4  mg Intravenous Given 11/06/22 1352)  iohexol (OMNIPAQUE) 300 MG/ML solution 75 mL (75 mLs Intravenous Contrast Given 11/06/22 1455)     IMPRESSION / MDM / ASSESSMENT AND PLAN / ED COURSE  I reviewed the triage vital signs and the nursing notes.                             27 year old female presents for evaluation of sore throat.  Patient was hypertensive in triage, VSS otherwise.  Patient in mild distress due to pain but nontoxic-appearing.  Differential diagnosis includes, but is not limited to, strep throat, peritonsillar abscess, retropharyngeal abscess, viral pharyngitis.  Patient's presentation is most consistent with acute complicated illness / injury requiring diagnostic workup.  CT soft tissue of the neck obtained to evaluate for recurrence of peritonsillar abscess.  I interpreted the images.   Strep test was positive.  CBC notable for elevated white count and neutrophils.  CMP unremarkable. Urine pregnancy was negative. Beta HCG was negative.   At shift change I was still waiting on CT scan results. If images show an abscess, patient will be admitted for IV antibiotics and I&D if necessar. If they are negative she can be discharged with antibiotics for treatment of strep.   Patient's care was passed off to [facility] PA-C who will determine final diagnosis and disposition.       FINAL CLINICAL IMPRESSION(S) / ED DIAGNOSES   Final diagnoses:  Strep pharyngitis     Rx / DC Orders   ED Discharge Orders     None        Note:  This document was prepared using Dragon voice recognition software and may include unintentional dictation errors.   Cameron Ali, PA-C 11/06/22 1531    Shaune Pollack, MD 11/06/22 [phone]

## 2022-11-06 NOTE — ED Provider Notes (Signed)
----------------------------------------- 3:18 PM on 11/06/2022 -----------------------------------------  Blood pressure (!) 147/81, pulse 81, temperature 98.9 F (37.2 C), temperature source Oral, resp. rate 18, height 5\' 6"  (1.676 m), weight 78.9 kg, SpO2 100%.  Assuming care from Gwenlyn Fudge, PA-C/NP-C.  In short, Colleen Chapman is a 27 y.o. female with a chief complaint of Sore Throat and Possible Pregnancy .  Refer to the original H&P for additional details.  The current plan of care is to await CT results and disposition the patient accordingly.  Patient with a history of recurrent PTA, presents with recurrent sore throat pain and concern for peritonsillar abscess..  ____________________________________________    ED Results / Procedures / Treatments   Labs (all labs ordered are listed, but only abnormal results are displayed) Labs Reviewed  GROUP A STREP BY PCR - Abnormal; Notable for the following components:      Result Value   Group A Strep by PCR DETECTED (*)    All other components within normal limits  CBC WITH DIFFERENTIAL/PLATELET - Abnormal; Notable for the following components:   WBC 12.6 (*)    Hemoglobin 15.4 (*)    HCT 46.4 (*)    Neutro Abs 10.6 (*)    All other components within normal limits  COMPREHENSIVE METABOLIC PANEL - Abnormal; Notable for the following components:   Potassium 3.4 (*)    All other components within normal limits  BODY FLUID CULTURE W GRAM STAIN  HCG, QUANTITATIVE, PREGNANCY  POC URINE PREG, ED     EKG   RADIOLOGY  I personally viewed and evaluated these images as part of my medical decision making, as well as reviewing the written report by the radiologist.  ED Provider Interpretation: Evidence of a right peritonsillar abscess}  CT Soft Tissue Neck W Contrast  Result Date: 11/06/2022 CLINICAL DATA:  Epiglottitis or tonsillitis suspected EXAM: CT NECK WITH CONTRAST TECHNIQUE: Multidetector CT imaging of the  neck was performed using the standard protocol following the bolus administration of intravenous contrast. RADIATION DOSE REDUCTION: This exam was performed according to the departmental dose-optimization program which includes automated exposure control, adjustment of the mA and/or kV according to patient size and/or use of iterative reconstruction technique. CONTRAST:  75mL OMNIPAQUE IOHEXOL 300 MG/ML  SOLN COMPARISON:  None Available. FINDINGS: Pharynx and larynx: Edematous and enlarged tonsils, compatible with tonsillitis. Approximately 2.2 x 1.6 x 2.9 cm peripherally enhancing fluid collection in the right tonsil, compatible with large tonsillar abscess. Surrounding edema with mild effacement of the airway. Larynx is within normal limits. Unremarkable epiglottis. Salivary glands: No inflammation, mass, or stone. Thyroid: Normal. Lymph nodes: Mildly prominent upper cervical chain lymph nodes bilaterally, nonspecific but likely reactive given the above findings. Vascular: Not well assessed due to non arterial timing. Limited intracranial: Negative. Visualized orbits: Negative. Mastoids and visualized paranasal sinuses: Clear. Skeleton: No acute abnormality on limited assessment. Upper chest: Visualized lung apices are clear. IMPRESSION: Findings compatible with tonsillitis and large (2.9 cm) right tonsillar abscess, detailed above. Electronically Signed   By: Feliberto Harts M.D.   On: 11/06/2022 16:21     PROCEDURES:  Critical Care performed: No  Procedures   MEDICATIONS ORDERED IN ED: Medications  sodium chloride 0.9 % bolus 1,000 mL (0 mLs Intravenous Stopped 11/06/22 1718)  dexamethasone (DECADRON) injection 10 mg (10 mg Intravenous Given 11/06/22 1355)  morphine (PF) 4 MG/ML injection 4 mg (4 mg Intravenous Given 11/06/22 1352)  iohexol (OMNIPAQUE) 300 MG/ML solution 75 mL (75 mLs Intravenous Contrast Given 11/06/22  1455)  lidocaine-EPINEPHrine (XYLOCAINE W/EPI) 1 %-1:100000 (with pres) injection  20 mL (20 mLs Intradermal Given 11/06/22 1728)  benzocaine (HURRICAINE) 20 % mouth spray (1 Application Mouth/Throat Given 11/06/22 1728)  Ampicillin-Sulbactam (UNASYN) 3 g in sodium chloride 0.9 % 100 mL IVPB (0 g Intravenous Stopped 11/06/22 1851)  ketorolac (TORADOL) 30 MG/ML injection 30 mg (30 mg Intravenous Given 11/06/22 1735)     IMPRESSION / MDM / ASSESSMENT AND PLAN / ED COURSE  I reviewed the triage vital signs and the nursing notes.                              Differential diagnosis includes, but is not limited to, tonsillitis, peritonsillar abscess, pharyngitis, Ludwig's angina  Patient's presentation is most consistent with acute complicated illness / injury requiring diagnostic workup.  ----------------------------------------- 4:39 PM on 11/06/2022 ----------------------------------------- S/W Dr. Willeen Cass: He will report to the ED to evaluate and treat the patient at bedside for her PTA.  He advised that the patient can follow-up with either here Dr. Okey Dupre, as she had been previously advised, in the office for outpatient management.  Patient's diagnosis is consistent with right PTA measuring up to 2.9 cm. Patient will be discharged home with prescriptions for Augmentin and Hycet solution. Patient is to follow up with Dr. Okey Dupre at [facility] as scheduled, as needed or otherwise directed. Patient is given ED precautions to return to the ED for any worsening or new symptoms.   FINAL CLINICAL IMPRESSION(S) / ED DIAGNOSES   Final diagnoses:  Strep pharyngitis  Peritonsillar abscess     Rx / DC Orders   ED Discharge Orders          Ordered    amoxicillin-clavulanate (AUGMENTIN) 875-125 MG tablet  2 times daily,   Status:  Discontinued        11/06/22 1924    HYDROcodone-acetaminophen (HYCET) 7.5-325 mg/15 ml solution  3 times daily,   Status:  Discontinued        11/06/22 1924    amoxicillin-clavulanate (AUGMENTIN) 875-125 MG tablet  2 times daily        11/06/22 1932     HYDROcodone-acetaminophen (HYCET) 7.5-325 mg/15 ml solution  3 times daily        11/06/22 1932             Note:  This document was prepared using Dragon voice recognition software and may include unintentional dictation errors.    Lissa Hoard, PA-C 11/06/22 Ouida Sills    Chesley Noon, MD 11/07/22 [phone]

## 2022-11-06 NOTE — Consult Note (Signed)
Karron, Kovacevic [id number] 02/09/1996 Sandi Mealy, MD  Reason for Consult: peritonsillar abscess Requesting Physician: Shaune Pollack, MD Consulting Physician: Sandi Mealy, MD  HPI: This 27 y.o. year old female was admitted on 11/06/2022 for throat pain. Patient with h/o recurrent right peritonsillar abscesses, drained twice in past several weeks, last drained by Dr. Okey Dupre 8/13. She has appointment with him later this week. Returned to the ER today with worsening throat pain with another obvious PTA on the right.   Allergies:  Allergies  Allergen Reactions   Bactrim [Sulfamethoxazole-Trimethoprim] Rash    Medications: (Not in a hospital admission) .  Current Facility-Administered Medications  Medication Dose Route Frequency Provider Last Rate Last Admin   Ampicillin-Sulbactam (UNASYN) 3 g in sodium chloride 0.9 % 100 mL IVPB  3 g Intravenous Once Menshew, Jenise V Bacon, PA-C       benzocaine (HURRICAINE) 20 % mouth spray   Mouth/Throat Once Menshew, Jenise V Bacon, PA-C       lidocaine-EPINEPHrine (XYLOCAINE W/EPI) 1 %-1:100000 (with pres) injection 20 mL  20 mL Intradermal Once Menshew, Charlesetta Ivory, PA-C       Current Outpatient Medications  Medication Sig Dispense Refill   acetaminophen (TYLENOL) 325 MG tablet Take 2 tablets (650 mg total) by mouth every 6 (six) hours as needed for mild pain (or Fever >/= 101). 60 tablet 0    PMH:  Past Medical History:  Diagnosis Date   Abnormal Pap smear of cervix 09/11/2020   ADHD (attention deficit hyperactivity disorder)    ongoing   Depression    "Because I was forced to have an abortion"   History of urinary tract infection    Pregnancy induced hypertension    Preeclampsia with 2017 delivery    Fam Hx:  Family History  Problem Relation Age of Onset   Diabetes Paternal Grandfather    Cancer Paternal Grandmother        Skin Ca   Diabetes Father    Hypertension Father    Asthma Father    Bipolar disorder Mother     Anemia Mother    Deafness Mother    Birth defects Mother        born without hand    Soc Hx:  Social History   Socioeconomic History   Marital status: Single    Spouse name: Teacher, music   Number of children: 3   Years of education: 12   Highest education level: 12th grade  Occupational History   Not on file  Tobacco Use   Smoking status: Never   Smokeless tobacco: Never  Vaping Use   Vaping status: Never Used  Substance and Sexual Activity   Alcohol use: Not Currently    Alcohol/week: 1.0 standard drink of alcohol    Types: 1 Standard drinks or equivalent per week    Comment: last use 2022 can't remember when   Drug use: Not Currently    Types: Marijuana    Comment: last marijuana use 1 yr ago   Sexual activity: Yes    Partners: Male    Birth control/protection: Injection    Comment: last depo over 1 yr ago  Other Topics Concern   Not on file  Social History Narrative   Not on file   Social Determinants of Health   Financial Resource Strain: Not on file  Food Insecurity: No Food Insecurity (10/15/2022)   Hunger Vital Sign    Worried About Running Out of Food in the Last Year: Never  true    Ran Out of Food in the Last Year: Never true  Transportation Needs: No Transportation Needs (10/15/2022)   PRAPARE - Administrator, Civil Service (Medical): No    Lack of Transportation (Non-Medical): No  Physical Activity: Not on file  Stress: Not on file  Social Connections: Not on file  Intimate Partner Violence: Not At Risk (10/15/2022)   Humiliation, Afraid, Rape, and Kick questionnaire    Fear of Current or Ex-Partner: No    Emotionally Abused: No    Physically Abused: No    Sexually Abused: No    PSH:  Past Surgical History:  Procedure Laterality Date   EAB  01/2018   NO PAST SURGERIES    . Procedures since admission: No admission procedures for hospital encounter.  ROS: Review of systems normal other than 12 systems except per  HPI.  PHYSICAL EXAM Vitals:  Vitals:   11/06/22 1209  BP: (!) 147/81  Pulse: 81  Resp: 18  Temp: 98.9 F (37.2 C)  SpO2: 100%  .  General: Well-developed, Well-nourished in no acute distress Mood: Mood and affect well adjusted, pleasant and cooperative. Orientation: Grossly alert and oriented. Vocal Quality: No hoarseness. Communicates verbally. head and Face: NCAT. No facial asymmetry. No visible skin lesions. No significant facial scars. No tenderness with sinus percussion. Facial strength normal and symmetric. Ears: External ears with normal landmarks, no lesions. External auditory canals free of infection, cerumen impaction or lesions. Tympanic membranes intact with good landmarks and normal mobility on pneumatic otoscopy. No middle ear effusion. Hearing: Speech reception grossly normal. Nose: External nose normal with midline dorsum and no lesions or deformity. Nasal Cavity reveals essentially midline septum with normal inferior turbinates. No significant mucosal congestion or erythema. Nasal secretions are minimal and clear. No polyps seen on anterior rhinoscopy. Oral Cavity/ Oropharynx: Lips are normal with no lesions. Teeth no frank dental caries. Gingiva healthy with no lesions or gingivitis. Edema and erythema of the right tonsil and adjacent soft palate. Indirect Laryngoscopy/Nasopharyngoscopy: Visualization of the larynx, hypopharynx and nasopharynx is not possible in this setting with routine examination. Neck: Supple and symmetric with no palpable masses, tenderness or crepitance. The trachea is midline. Thyroid gland is soft, nontender and symmetric with no masses or enlargement. Parotid and submandibular glands are soft, nontender and symmetric, without masses. Lymphatic: tender right JD nodes. Respiratory: Normal respiratory effort without labored breathing. Cardiovascular: Carotid pulse shows regular rate and rhythm Neurologic: Cranial Nerves II through XII are grossly  intact. Eyes: Gaze and Ocular Motility are grossly normal. PERRLA. No visible nystagmus.  MEDICAL DECISION MAKING: Data Review:  Results for orders placed or performed during the hospital encounter of 11/06/22 (from the past 48 hour(s))  Group A Strep by PCR     Status: Abnormal   Collection Time: 11/06/22 12:12 PM   Specimen: Throat; Sterile Swab  Result Value Ref Range   Group A Strep by PCR DETECTED (A) NOT DETECTED    Comment: Performed at [facility], [address] [place], [place] [address]  POC Urine Pregnancy, ED     Status: None   Collection Time: 11/06/22 12:32 PM  Result Value Ref Range   Preg Test, Ur Negative Negative  CBC with Differential     Status: Abnormal   Collection Time: 11/06/22  1:49 PM  Result Value Ref Range   WBC 12.6 (H) 4.0 - 10.5 K/uL   RBC 5.02 3.87 - 5.11 MIL/uL   Hemoglobin 15.4 (H)  12.0 - 15.0 g/dL   HCT 62.9 (H) 52.8 - 41.3 %   MCV 92.4 80.0 - 100.0 fL   MCH 30.7 26.0 - 34.0 pg   MCHC 33.2 30.0 - 36.0 g/dL   RDW 24.4 01.0 - 27.2 %   Platelets 287 150 - 400 K/uL   nRBC 0.0 0.0 - 0.2 %   Neutrophils Relative % 85 %   Neutro Abs 10.6 (H) 1.7 - 7.7 K/uL   Lymphocytes Relative 9 %   Lymphs Abs 1.1 0.7 - 4.0 K/uL   Monocytes Relative 6 %   Monocytes Absolute 0.8 0.1 - 1.0 K/uL   Eosinophils Relative 0 %   Eosinophils Absolute 0.0 0.0 - 0.5 K/uL   Basophils Relative 0 %   Basophils Absolute 0.0 0.0 - 0.1 K/uL   Immature Granulocytes 0 %   Abs Immature Granulocytes 0.04 0.00 - 0.07 K/uL    Comment: Performed at [facility], [address] [place], [place] [address]  Comprehensive metabolic panel     Status: Abnormal   Collection Time: 11/06/22  1:49 PM  Result Value Ref Range   Sodium 136 135 - 145 mmol/L   Potassium 3.4 (L) 3.5 - 5.1 mmol/L   Chloride 101 98 - 111 mmol/L   CO2 24 22 - 32 mmol/L   Glucose, Bld 92 70 - 99 mg/dL    Comment: Glucose reference range applies only to samples taken after fasting for at  least 8 hours.   BUN 12 6 - 20 mg/dL   Creatinine, Ser 4.03 0.44 - 1.00 mg/dL   Calcium 9.3 8.9 - 47.4 mg/dL   Total Protein 7.9 6.5 - 8.1 g/dL   Albumin 4.4 3.5 - 5.0 g/dL   AST 18 15 - 41 U/L   ALT 21 0 - 44 U/L   Alkaline Phosphatase 54 38 - 126 U/L   Total Bilirubin 1.1 0.3 - 1.2 mg/dL   GFR, Estimated >25 >95 mL/min    Comment: (NOTE) Calculated using the CKD-EPI Creatinine Equation (2021)    Anion gap 11 5 - 15    Comment: Performed at [facility], [address] [place], [place] [address]  hCG, quantitative, pregnancy     Status: None   Collection Time: 11/06/22  1:49 PM  Result Value Ref Range   hCG, Beta Chain, Quant, S 1 <5 mIU/mL    Comment:          GEST. AGE      CONC.  (mIU/mL)   <=1 WEEK        5 - 50     2 WEEKS       50 - 500     3 WEEKS       100 - 10,000     4 WEEKS     1,000 - 30,000     5 WEEKS     3,500 - 115,000   6-8 WEEKS     12,000 - 270,000    12 WEEKS     15,000 - 220,000        FEMALE AND NON-PREGNANT FEMALE:     LESS THAN 5 mIU/mL Performed at [facility], [address] [place], [place] [address]   . CT Soft Tissue Neck W Contrast  Result Date: 11/06/2022 CLINICAL DATA:  Epiglottitis or tonsillitis suspected EXAM: CT NECK WITH CONTRAST TECHNIQUE: Multidetector CT imaging of the neck was performed using the standard protocol following the bolus administration of intravenous contrast.  RADIATION DOSE REDUCTION: This exam was performed according to the departmental dose-optimization program which includes automated exposure control, adjustment of the mA and/or kV according to patient size and/or use of iterative reconstruction technique. CONTRAST:  75mL OMNIPAQUE IOHEXOL 300 MG/ML  SOLN COMPARISON:  None Available. FINDINGS: Pharynx and larynx: Edematous and enlarged tonsils, compatible with tonsillitis. Approximately 2.2 x 1.6 x 2.9 cm peripherally enhancing fluid collection in the right tonsil, compatible with large tonsillar  abscess. Surrounding edema with mild effacement of the airway. Larynx is within normal limits. Unremarkable epiglottis. Salivary glands: No inflammation, mass, or stone. Thyroid: Normal. Lymph nodes: Mildly prominent upper cervical chain lymph nodes bilaterally, nonspecific but likely reactive given the above findings. Vascular: Not well assessed due to non arterial timing. Limited intracranial: Negative. Visualized orbits: Negative. Mastoids and visualized paranasal sinuses: Clear. Skeleton: No acute abnormality on limited assessment. Upper chest: Visualized lung apices are clear. IMPRESSION: Findings compatible with tonsillitis and large (2.9 cm) right tonsillar abscess, detailed above. Electronically Signed   By: Feliberto Harts M.D.   On: 11/06/2022 16:21  .   PROCEDURE: Preoperative Diagnosis: Right peritonsillar abscess Postoperative Diagnosis: Same Procedure: Incision and drainage of right peritonsillar abscess Indications: Right peritonsillar abscess Findings:right peritonsillar abscess, 4cc of pus drained Description of Procedure: After discussing procedure and risks  (primarily bleeding) with the patient, the throat anesthetized with topical Hurricane spray and the region around the right tonsil injected with 1% lidocaine with epinephrine, 1:100,000. An 18 gage needle was used to aspirate above the tonsil, aspirating approximately 4 cc of purulence. This area was then opened with a 15 blade and a hemostat used to carefully bluntly open the peritonsillar space and drain any loculated purulence. The patient tolerated the procedure well.   ASSESSMENT: right peritonsillar abscess, recurrent  PLAN: The abscess was successfully drained. Would cover with Augmentin or Clindamycin based on prior cultures. She has a follow-up appointment with Dr. Okey Dupre this Friday and should keep that so they can discuss setting up a tonsillectomy in the near future   Sandi Mealy, MD 11/06/2022 4:57 PM

## 2022-11-06 NOTE — ED Triage Notes (Signed)
Patient to ED via POV for sore throat- states she has an abscess. Patient also thinks she's pregnant- last period was Aug 9th. Took a pregnancy test 2 weeks ago that showed a faint line.

## 2022-11-08 ENCOUNTER — Other Ambulatory Visit: Payer: Self-pay | Admitting: Otolaryngology

## 2022-11-08 LAB — BODY FLUID CULTURE W GRAM STAIN

## 2022-11-11 ENCOUNTER — Emergency Department
Admission: EM | Admit: 2022-11-11 | Discharge: 2022-11-11 | Disposition: A | Discharge status: Home / Self Care | Payer: Medicaid Other | Attending: Student in an Organized Health Care Education/Training Program | Admitting: Student in an Organized Health Care Education/Training Program

## 2022-11-11 ENCOUNTER — Encounter: Payer: Self-pay | Admitting: Emergency Medicine

## 2022-11-11 ENCOUNTER — Other Ambulatory Visit: Payer: Self-pay

## 2022-11-11 DIAGNOSIS — R110 Nausea: Secondary | ICD-10-CM | POA: Insufficient documentation

## 2022-11-11 DIAGNOSIS — R109 Unspecified abdominal pain: Secondary | ICD-10-CM | POA: Insufficient documentation

## 2022-11-11 DIAGNOSIS — J029 Acute pharyngitis, unspecified: Secondary | ICD-10-CM | POA: Insufficient documentation

## 2022-11-11 LAB — URINALYSIS, ROUTINE W REFLEX MICROSCOPIC
Bacteria, UA: NONE SEEN
Bilirubin Urine: NEGATIVE
Glucose, UA: NEGATIVE mg/dL
Ketones, ur: 20 mg/dL — AB
Nitrite: NEGATIVE
Protein, ur: 30 mg/dL — AB
RBC / HPF: >50 RBC/hpf (ref 0–5)
Specific Gravity, Urine: 1.033 — ABNORMAL HIGH (ref 1.005–1.030)
pH: 5 (ref 5.0–8.0)

## 2022-11-11 LAB — POC URINE PREG, ED: Preg Test, Ur: NEGATIVE

## 2022-11-11 LAB — CBC
HCT: 46.9 % — ABNORMAL HIGH (ref 36.0–46.0)
Hemoglobin: 15.6 g/dL — ABNORMAL HIGH (ref 12.0–15.0)
MCH: 30.8 pg (ref 26.0–34.0)
MCHC: 33.3 g/dL (ref 30.0–36.0)
MCV: 92.7 fL (ref 80.0–100.0)
Platelets: 295 10*3/uL (ref 150–400)
RBC: 5.06 MIL/uL (ref 3.87–5.11)
RDW: 12.4 % (ref 11.5–15.5)
WBC: 6.6 10*3/uL (ref 4.0–10.5)
nRBC: 0 % (ref 0.0–0.2)

## 2022-11-11 LAB — CHLAMYDIA/NGC RT PCR (ARMC ONLY)
Chlamydia Tr: NOT DETECTED
N gonorrhoeae: NOT DETECTED

## 2022-11-11 LAB — BASIC METABOLIC PANEL
Anion gap: 10 (ref 5–15)
BUN: 13 mg/dL (ref 6–20)
CO2: 28 mmol/L (ref 22–32)
Calcium: 9.2 mg/dL (ref 8.9–10.3)
Chloride: 101 mmol/L (ref 98–111)
Creatinine, Ser: 0.76 mg/dL (ref 0.44–1.00)
GFR, Estimated: >60 mL/min (ref >60)
Glucose, Bld: 96 mg/dL (ref 70–99)
Potassium: 3.8 mmol/L (ref 3.5–5.1)
Sodium: 139 mmol/L (ref 135–145)

## 2022-11-11 MED ORDER — DIAZEPAM 5 MG PO TABS
5.0000 mg | ORAL_TABLET | Freq: Once | ORAL | Status: AC
  Administered 2022-11-11: 5 mg via ORAL
  Filled 2022-11-11: qty 1

## 2022-11-11 MED ORDER — KETOROLAC TROMETHAMINE 30 MG/ML IJ SOLN
15.0000 mg | Freq: Once | INTRAMUSCULAR | Status: AC
  Administered 2022-11-11: 15 mg via INTRAVENOUS
  Filled 2022-11-11: qty 1

## 2022-11-11 MED ORDER — DEXAMETHASONE SODIUM PHOSPHATE 10 MG/ML IJ SOLN
10.0000 mg | Freq: Once | INTRAMUSCULAR | Status: AC
  Administered 2022-11-11: 10 mg via INTRAVENOUS
  Filled 2022-11-11: qty 1

## 2022-11-11 MED ORDER — SODIUM CHLORIDE 0.9 % IV BOLUS
1000.0000 mL | Freq: Once | INTRAVENOUS | Status: AC
  Administered 2022-11-11: 1000 mL via INTRAVENOUS

## 2022-11-11 NOTE — Group Note (Deleted)

## 2022-11-11 NOTE — ED Provider Notes (Signed)
[facility] Provider Note    Event Date/Time   First MD Initiated Contact with Patient 11/11/22 1348     (approximate)   History   Abdominal Pain   HPI  Colleen Chapman is a 27 y.o. female with recurrent sore throat is status post I&D peritonsillar abscess growing abundant strep pyogenes currently on amoxicillin as well as Flagyl presents to the ER for nausea feeling unwell.  No measured fevers.  Has had trouble swallowing the Flagyl because of the size of the pill.  Patient quite anxious and stressed out but she is worried that she is gotten this infection from having oral sex with a partner.     Physical Exam   Triage Vital Signs: ED Triage Vitals  Encounter Vitals Group     BP 11/11/22 1210 (!) 133/92     Systolic BP Percentile --      Diastolic BP Percentile --      Pulse Rate 11/11/22 1209 72     Resp 11/11/22 1209 17     Temp 11/11/22 1210 97.8 F (36.6 C)     Temp Source 11/11/22 1210 Oral     SpO2 11/11/22 1210 100 %     Weight 11/11/22 1211 173 lb 15.1 oz (78.9 kg)     Height 11/11/22 1211 5\' 6"  (1.676 m)     Head Circumference --      Peak Flow --      Pain Score 11/11/22 1211 6     Pain Loc --      Pain Education --      Exclude from Growth Chart --     Most recent vital signs: Vitals:   11/11/22 1209 11/11/22 1210  BP:  (!) 133/92  Pulse: 72 (!) 51  Resp: 17 18  Temp:  97.8 F (36.6 C)  SpO2:  100%     Constitutional: Alert  Eyes: Conjunctivae are normal.  Head: Atraumatic. Nose: No congestion/rhinnorhea. Mouth/Throat: Mild fullness on the right compared to the left but no muffled voice.  uvula is midline.  No trismus. Neck: Painless ROM.  Cardiovascular:   Good peripheral circulation. Respiratory: Normal respiratory effort.  No retractions.  Gastrointestinal: Soft and nontender.  Musculoskeletal:  no deformity Neurologic:  MAE spontaneously. No gross focal neurologic deficits are appreciated.  Skin:  Skin  is warm, dry and intact. No rash noted. Psychiatric: Mood and affect are normal. Speech and behavior are normal.    ED Results / Procedures / Treatments   Labs (all labs ordered are listed, but only abnormal results are displayed) Labs Reviewed  URINALYSIS, ROUTINE W REFLEX MICROSCOPIC - Abnormal; Notable for the following components:      Result Value   Color, Urine AMBER (*)    APPearance HAZY (*)    Specific Gravity, Urine 1.033 (*)    Hgb urine dipstick LARGE (*)    Ketones, ur 20 (*)    Protein, ur 30 (*)    Leukocytes,Ua SMALL (*)    All other components within normal limits  CBC - Abnormal; Notable for the following components:   Hemoglobin 15.6 (*)    HCT 46.9 (*)    All other components within normal limits  CHLAMYDIA/NGC RT PCR ([facility] ONLY)            BASIC METABOLIC PANEL  POC URINE PREG, ED     EKG     RADIOLOGY    PROCEDURES:  Critical Care performed:   Procedures  MEDICATIONS ORDERED IN ED: Medications  diazepam (VALIUM) tablet 5 mg (5 mg Oral Given 11/11/22 1423)  sodium chloride 0.9 % bolus 1,000 mL (1,000 mLs Intravenous New Bag/Given 11/11/22 1423)  ketorolac (TORADOL) 30 MG/ML injection 15 mg (15 mg Intravenous Given 11/11/22 1423)  dexamethasone (DECADRON) injection 10 mg (10 mg Intravenous Given 11/11/22 1423)     IMPRESSION / MDM / ASSESSMENT AND PLAN / ED COURSE  I reviewed the triage vital signs and the nursing notes.                              Differential diagnosis includes, but is not limited to, PTA, RPA, strep pharyngitis, dehydration, electrolyte abnormality, anxiety.  Patient presenting to the ER for evaluation of symptoms as described above.  Based on symptoms, risk factors and considered above differential, this presenting complaint could reflect a potentially life-threatening illness therefore the patient will be placed on continuous pulse oximetry and telemetry for monitoring.  Laboratory evaluation will be sent to evaluate  for the above complaints.  Patient clinically very well-appearing however.  Does still have some persistent fullness on the right tonsillar pillar but not consistent with new or worsening abscess clinically at this time.  Will provide symptomatic treatment.  Patient had significant improvement in her symptoms with reassurance that her cultures did not grow out gonorrhea and when she was informed that it was just strep.  She is tolerating p.o.  She does appear stable and appropriate for outpatient follow-up.     FINAL CLINICAL IMPRESSION(S) / ED DIAGNOSES   Final diagnoses:  Pharyngitis, unspecified etiology     Rx / DC Orders   ED Discharge Orders     None        Note:  This document was prepared using Dragon voice recognition software and may include unintentional dictation errors.    Willy Eddy, MD 11/11/22 [phone]

## 2022-11-11 NOTE — ED Triage Notes (Signed)
Pt here needing her tonsils removed. Pt states she was supposed to follow up with a provider but has nit. Pt also having nausea and vomiting and has not been able to keep anything down. Pt endorses abd pain. Pt is currently on amoxicillin for strep throat.

## 2022-11-14 ENCOUNTER — Encounter
Admission: RE | Admit: 2022-11-14 | Discharge: 2022-11-14 | Disposition: A | Discharge status: Home / Self Care | Payer: Medicaid Other | Source: Ambulatory Visit | Attending: Otolaryngology | Admitting: Otolaryngology

## 2022-11-14 VITALS — Ht 66.0 in | Wt 174.0 lb

## 2022-11-14 DIAGNOSIS — Z01812 Encounter for preprocedural laboratory examination: Secondary | ICD-10-CM

## 2022-11-14 NOTE — Pre-Procedure Instructions (Signed)
Several attempts made to reach patient to do her PAT scheduled for today, not able to leave VM as its not set up, called Alternate contact # spoke with Corbett,Tamarcus (Significant other) , he stated that he will have her return our call, but believes that she is at work currently.

## 2022-11-14 NOTE — Patient Instructions (Addendum)
Your procedure is scheduled on: 11/20/22 - Wednesday Report to the Registration Desk on the 1st floor of the [facility]. To find out your arrival time, please call [phone] between 1PM - 3PM on: 11/19/22 - Tuesday If your arrival time is 6:00 am, do not arrive before that time as the [facility] entrance doors do not open until 6:00 am.  REMEMBER: Instructions that are not followed completely may result in serious medical risk, up to and including death; or upon the discretion of your surgeon and anesthesiologist your surgery may need to be rescheduled.  Do not eat food after midnight the night before surgery.  No gum chewing or hard candies.  You may however, drink CLEAR liquids up to 2 hours before you are scheduled to arrive for your surgery. Do not drink anything within 2 hours of your scheduled arrival time.  Clear liquids include: - water  - apple juice without pulp - gatorade (not RED colors) - black coffee or tea (Do NOT add milk or creamers to the coffee or tea) Do NOT drink anything that is not on this list.  One week prior to surgery: Stop Anti-inflammatories (NSAIDS) such as Advil, Aleve, Ibuprofen, Motrin, Naproxen, Naprosyn and Aspirin based products such as Excedrin, Goody's Powder, BC Powder. You may however, continue to take Tylenol if needed for pain up until the day of surgery.  Stop ANY OVER THE COUNTER supplements until after surgery.  DO NOT TAKE ANY MEDICATIONS THE MORNING OF SURGERY   No Alcohol for 24 hours before or after surgery.  No Smoking including e-cigarettes for 24 hours before surgery.  No chewable tobacco products for at least 6 hours before surgery.  No nicotine patches on the day of surgery.  Do not use any "recreational" drugs for at least a week (preferably 2 weeks) before your surgery.  Please be advised that the combination of cocaine and anesthesia may have negative outcomes, up to and including death. If you test positive for  cocaine, your surgery will be cancelled.  On the morning of surgery brush your teeth with toothpaste and water, you may rinse your mouth with mouthwash if you wish. Do not swallow any toothpaste or mouthwash.  Do not wear jewelry, make-up, hairpins, clips or nail polish.  For welded (permanent) jewelry: bracelets, anklets, waist bands, etc.  Please have this removed prior to surgery.  If it is not removed, there is a chance that hospital personnel will need to cut it off on the day of surgery.  Do not wear lotions, powders, or perfumes.   Do not shave body hair from the neck down 48 hours before surgery.  Contact lenses, hearing aids and dentures may not be worn into surgery.  Do not bring valuables to the hospital. [facility] is not responsible for any missing/lost belongings or valuables.   Notify your doctor if there is any change in your medical condition (cold, fever, infection).  Wear comfortable clothing (specific to your surgery type) to the hospital.  After surgery, you can help prevent lung complications by doing breathing exercises.  Take deep breaths and cough every 1-2 hours. Your doctor may order a device called an Incentive Spirometer to help you take deep breaths.  If you are being discharged the day of surgery, you will not be allowed to drive home. You will need a responsible individual to drive you home and stay with you for 24 hours after surgery.   If you are taking public transportation, you  will need to have a responsible individual with you.  Please call the Pre-admissions Testing Dept. at [phone] if you have any questions about these instructions.  Surgery Visitation Policy:  Patients having surgery or a procedure may have two visitors.  Children under the age of 46 must have an adult with them who is not the patient.

## 2022-11-19 NOTE — H&P (Signed)
HPI: This is a 27 year old female who is being seen for a chief complaint of a peritonsillar abscess. The abscess is felt to be around the right tonsil. She has a peritonsillar abscess that is described as painful and recurrent and moderate in severity. Symptoms have been worsening. She has associated difficulty swallowing and pain with swallowing. The peritonsillar abscess has been present for 2 months. She has had 3 episodes of a peritonsillar abscess in the last year. Patient is in for a recurrent peritonsillar abscess that has flared up 3 times since July. She notes she has been hospitalized with this twice and just had it drained again 2 days ago. She notes this is very painful and causes trouble swallowing. She is concerned due to the amount of work she has been missing. She is quite healthy otherwise and has had no other surgeries. Allergies - Bactrim. ---- [name] is a pleasant 27 year old woman who has been having recurrent right PTA infections over the past few months, requiring transoral drainage in the ED on 3 separate occasions. cultures have been positive for fusibacterium necrophorum and later strep pyogenes x 2. Several CT scans have confirmed a right sided peri-tonsillar fluid collection. Vitals: Date Taken By B.P. Pulse Resp. O2 Sat. Temp. Ht. Wt. BMI BSA 11/08/22 09:12 WILLIFORD, JOSHUA 124/68 SIT 97.1 F 66.0 in* 174.0 lbs* 28.1 1.9 FiO2 * Patient Reported Exam: An Otolaryngologic exam was performed Otolaryngologic exam External Ears: external ear examination of normal size and morphology without traumatic or congenital deformity AD, external ear examination of normal size and morphology without traumatic or congenital deformity AS. External ear canal AD: Normal EAC exam External ear canal AS: Normal EAC exam Tympanic membrane AD: AD tympanic membrane intact, no fluid, normal mobility on pneumotoscopy Tympanic membrane AS: AS tympanic  membrane intact, no fluid, normal mobility on pneumotoscopy External Nose: Nasal dorsum midline Right Nasal Cavity: right intranasal examination normal without turbinate hypertrophy, masses, septal deformity or synechiae Left nasal cavity: left intranasal examination normal without turbinate hypertrophy, masses, septal deformity or synechiae Lips, Teeth, Gums: normal lip morphology and anatomy, class I occlusion, no dental abnormalities Oral cavity/Oropharynx:tonsil hypertrophy, 3+ right Some persistent right sided peri-tonsillar swelling Visit Note - November 08, 2022 Colleen Chapman, Colleen Chapman MRN: [id number] DOB: April 29, 1995 Sex: Female PMS ID: [id number] Adron Bene (Primary Provider) (Bill Under) Page 2 [phone] Work [phone] Fax [facility] [place] [address] [place], [place] [address] and erythema. , tonsil hypertrophy 4+ The remainder of the oral cavity and oropharyngeal exam (buccal mucosa, tongue, floor of mouth, hard and soft palates, tonsils, posterior and lateral pharyngeal walls) is normal with the exception of the above findings. Head Inspection: Normal head inspection with normal head shape, without masses or concerning lesions.  Head Palpation: Normal head inspection without masses, palpable deformities, or concerning lesions. Salivary: No palpable salivary gland masses - no erythema or tenderness. Facial nerve intact and symmetric bilaterally. Neck: normal neck examination without skin masses, tenderness or crepitus  Thyroid: normal thyroid examination without masses or nodules  Chest - clear to auscultation bilaterally C/V - regular rate and rhythm without murmur    Respiratory Effort: normal respiratory effort without labored breathing or accessory muscle use  Neck Lymph Node: normal lymphatic exam without lymphadenopathy in cranial or cervical regions Neuro - Cranial Nerves: Cranial nerves II-XII  intact. Appearance: well developed and nourished Communication: normal vocal quality and ability to communicate Orientation: Alert  and oriented to person, place, time. 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 Please refer to the education handout for detailed counseling. Plan: Treatment Regimen. Begin the following treatments: ** Recommend to OR at [facility] in the near future for bilateral tonsillectomy. We would ordinarily wait longer for complete healing prior to surgery, but will expedite this some as she has been having a number of recurrent episodes. Anticipate DC home same day. RTC - Dr. Okey Dupre at [facility] clinic 3-4 weeks postop. Discussed risks, benefits, and options today including bleeding, infection, and need for further surgery. She understands and agrees to proceed.. 1. 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 Please refer to the education handout for detailed counseling. Plan: Order for Surgery. 2. Visit Note - November 08, 2022 Colleen Chapman, Colleen Chapman MRN: [id number] DOB: [birth year] Sex: Female PMS ID: [id number] Adron Bene (Primary Provider) [facility] Under) Page 3 [phone] Work [phone] Fax [facility] [place] [address] [place], [place] [address] Surgery scheduling order Surgeon: Reola Mosher Procedure(s): tonsillectomy over age of 56; CPT: 72 Estimated Time: 45. Post-op follow up in: 21 days Diagnosis codes: Hypertrophy of tonsils J35.1 Target surgery date: 11/20/22. Anesthesia: general. Admission Status: outpatient. Recovery Facility: [facility]. Provider: Adron Bene Priority: normal   Magnus Ivan. Okey Dupre, MD, MBA, [facility] Otolaryngology-Head &  Neck Surgery [facility] [phone]

## 2022-11-20 ENCOUNTER — Encounter: Payer: Self-pay | Admitting: Otolaryngology

## 2022-11-20 ENCOUNTER — Other Ambulatory Visit: Payer: Self-pay

## 2022-11-20 ENCOUNTER — Encounter
Admission: RE | Disposition: A | Discharge status: Home / Self Care | Payer: Self-pay | Source: Home / Self Care | Attending: Otolaryngology

## 2022-11-20 ENCOUNTER — Ambulatory Visit: Payer: Medicaid Other | Admitting: Certified Registered"

## 2022-11-20 ENCOUNTER — Ambulatory Visit
Admission: RE | Admit: 2022-11-20 | Discharge: 2022-11-20 | Disposition: A | Discharge status: Home / Self Care | Payer: Medicaid Other | Attending: Otolaryngology | Admitting: Otolaryngology

## 2022-11-20 DIAGNOSIS — J0391 Acute recurrent tonsillitis, unspecified: Secondary | ICD-10-CM | POA: Diagnosis present

## 2022-11-20 DIAGNOSIS — Z01812 Encounter for preprocedural laboratory examination: Secondary | ICD-10-CM

## 2022-11-20 DIAGNOSIS — J3501 Chronic tonsillitis: Secondary | ICD-10-CM | POA: Diagnosis not present

## 2022-11-20 DIAGNOSIS — J351 Hypertrophy of tonsils: Secondary | ICD-10-CM | POA: Diagnosis not present

## 2022-11-20 HISTORY — PX: TONSILLECTOMY: SHX5217

## 2022-11-20 LAB — POCT PREGNANCY, URINE: Preg Test, Ur: NEGATIVE

## 2022-11-20 SURGERY — TONSILLECTOMY
Anesthesia: General | Site: Throat | Laterality: Bilateral

## 2022-11-20 MED ORDER — FENTANYL CITRATE (PF) 100 MCG/2ML IJ SOLN
INTRAMUSCULAR | Status: AC
  Filled 2022-11-20: qty 2

## 2022-11-20 MED ORDER — PROPOFOL 10 MG/ML IV BOLUS
INTRAVENOUS | Status: AC
  Filled 2022-11-20: qty 40

## 2022-11-20 MED ORDER — FAMOTIDINE 20 MG PO TABS
ORAL_TABLET | ORAL | Status: AC
  Filled 2022-11-20: qty 1

## 2022-11-20 MED ORDER — ACETAMINOPHEN 10 MG/ML IV SOLN
INTRAVENOUS | Status: DC | PRN
  Administered 2022-11-20: 1000 mg via INTRAVENOUS

## 2022-11-20 MED ORDER — TRIPLE ANTIBIOTIC 3.5-400-5000 EX OINT
TOPICAL_OINTMENT | CUTANEOUS | Status: AC
  Filled 2022-11-20: qty 1

## 2022-11-20 MED ORDER — CHLORHEXIDINE GLUCONATE 0.12 % MT SOLN
OROMUCOSAL | Status: AC
  Filled 2022-11-20: qty 15

## 2022-11-20 MED ORDER — OXYCODONE HCL 5 MG/5ML PO SOLN: 5.0000 mg | ORAL | 0 refills | Status: AC | PRN

## 2022-11-20 MED ORDER — DEXAMETHASONE SODIUM PHOSPHATE 10 MG/ML IJ SOLN
INTRAMUSCULAR | Status: DC | PRN
  Administered 2022-11-20: 10 mg via INTRAVENOUS

## 2022-11-20 MED ORDER — FENTANYL CITRATE (PF) 100 MCG/2ML IJ SOLN
25.0000 ug | INTRAMUSCULAR | Status: DC | PRN
  Administered 2022-11-20 (×4): 50 ug via INTRAVENOUS

## 2022-11-20 MED ORDER — LIDOCAINE HCL (CARDIAC) PF 100 MG/5ML IV SOSY
PREFILLED_SYRINGE | INTRAVENOUS | Status: DC | PRN
  Administered 2022-11-20: 60 mg via INTRAVENOUS

## 2022-11-20 MED ORDER — SUCCINYLCHOLINE CHLORIDE 200 MG/10ML IV SOSY
PREFILLED_SYRINGE | INTRAVENOUS | Status: DC | PRN
  Administered 2022-11-20: 120 mg via INTRAVENOUS

## 2022-11-20 MED ORDER — SEVOFLURANE IN SOLN
RESPIRATORY_TRACT | Status: AC
  Filled 2022-11-20: qty 250

## 2022-11-20 MED ORDER — ONDANSETRON HCL 4 MG/2ML IJ SOLN
INTRAMUSCULAR | Status: DC | PRN
  Administered 2022-11-20: 4 mg via INTRAVENOUS

## 2022-11-20 MED ORDER — PROPOFOL 10 MG/ML IV BOLUS
INTRAVENOUS | Status: DC | PRN
  Administered 2022-11-20: 150 mg via INTRAVENOUS
  Administered 2022-11-20: 50 mg via INTRAVENOUS

## 2022-11-20 MED ORDER — 0.9 % SODIUM CHLORIDE (POUR BTL) OPTIME
TOPICAL | Status: DC | PRN
  Administered 2022-11-20: 500 mL

## 2022-11-20 MED ORDER — OXYCODONE HCL 5 MG PO TABS: 5.0000 mg | ORAL_TABLET | Freq: Once | ORAL | Status: DC | PRN

## 2022-11-20 MED ORDER — SUGAMMADEX SODIUM 200 MG/2ML IV SOLN
INTRAVENOUS | Status: DC | PRN
  Administered 2022-11-20: 200 mg via INTRAVENOUS

## 2022-11-20 MED ORDER — ORAL CARE MOUTH RINSE: 15.0000 mL | Freq: Once | OROMUCOSAL | Status: AC

## 2022-11-20 MED ORDER — MIDAZOLAM HCL 2 MG/2ML IJ SOLN
INTRAMUSCULAR | Status: DC | PRN
  Administered 2022-11-20: 2 mg via INTRAVENOUS

## 2022-11-20 MED ORDER — CHLORHEXIDINE GLUCONATE 0.12 % MT SOLN
15.0000 mL | Freq: Once | OROMUCOSAL | Status: AC
  Administered 2022-11-20: 15 mL via OROMUCOSAL

## 2022-11-20 MED ORDER — MIDAZOLAM HCL 2 MG/2ML IJ SOLN
INTRAMUSCULAR | Status: AC
  Filled 2022-11-20: qty 2

## 2022-11-20 MED ORDER — LACTATED RINGERS IV SOLN: INTRAVENOUS | Status: DC

## 2022-11-20 MED ORDER — OXYMETAZOLINE HCL 0.05 % NA SOLN
NASAL | Status: AC
  Filled 2022-11-20: qty 30

## 2022-11-20 MED ORDER — DEXMEDETOMIDINE HCL IN NACL 80 MCG/20ML IV SOLN
INTRAVENOUS | Status: DC | PRN
Start: 2022-11-20 — End: 2022-11-20
  Administered 2022-11-20: 12 ug via INTRAVENOUS
  Administered 2022-11-20: 8 ug via INTRAVENOUS

## 2022-11-20 MED ORDER — ROCURONIUM BROMIDE 100 MG/10ML IV SOLN
INTRAVENOUS | Status: DC | PRN
  Administered 2022-11-20: 40 mg via INTRAVENOUS

## 2022-11-20 MED ORDER — FAMOTIDINE 20 MG PO TABS
20.0000 mg | ORAL_TABLET | Freq: Once | ORAL | Status: AC
  Administered 2022-11-20: 20 mg via ORAL

## 2022-11-20 MED ORDER — CEFAZOLIN SODIUM-DEXTROSE 2-3 GM-%(50ML) IV SOLR
INTRAVENOUS | Status: DC | PRN
Start: 2022-11-20 — End: 2022-11-20
  Administered 2022-11-20: 2 g via INTRAVENOUS

## 2022-11-20 MED ORDER — OXYCODONE HCL 5 MG/5ML PO SOLN: 5.0000 mg | Freq: Once | ORAL | Status: DC | PRN

## 2022-11-20 MED ORDER — FENTANYL CITRATE (PF) 100 MCG/2ML IJ SOLN
INTRAMUSCULAR | Status: DC | PRN
  Administered 2022-11-20 (×2): 50 ug via INTRAVENOUS

## 2022-11-20 SURGICAL SUPPLY — 25 items
ANTIFOG SOL W/FOAM PAD STRL (MISCELLANEOUS) ×1
CANISTER SUCT 1200ML W/VALVE (MISCELLANEOUS) ×1 IMPLANT
CATH ROBINSON RED A/P 10FR (CATHETERS) ×1 IMPLANT
CORD BIP STRL DISP 12FT (MISCELLANEOUS) IMPLANT
DRESSING NASL FOAM PST OP SINU (MISCELLANEOUS) IMPLANT
DRSG NASAL FOAM POST OP SINU (MISCELLANEOUS)
ELECT CAUTERY BLADE TIP 2.5 (TIP) ×1
ELECT REM PT RETURN 9FT ADLT (ELECTROSURGICAL) ×1
ELECTRODE CAUTERY BLDE TIP 2.5 (TIP) ×1 IMPLANT
ELECTRODE REM PT RTRN 9FT ADLT (ELECTROSURGICAL) ×1 IMPLANT
GAUZE SPONGE 4X4 12PLY STRL (GAUZE/BANDAGES/DRESSINGS) ×1 IMPLANT
GLOVE BIOGEL M 7.0 STRL (GLOVE) IMPLANT
GLOVE PI ULTRA LF STRL 7.5 (GLOVE) IMPLANT
GLOVE SURG GAMMEX PI TX LF 7.5 (GLOVE) ×2 IMPLANT
GOWN SRG XL LVL 3 NONREINFORCE (GOWNS) IMPLANT
GOWN STRL NON-REIN TWL XL LVL3 (GOWNS) ×2
KIT SUCTION CATH 14FR (SUCTIONS) IMPLANT
KIT TURNOVER KIT A (KITS) ×1 IMPLANT
PACK HEAD/NECK (MISCELLANEOUS) ×1 IMPLANT
PATTIES SURGICAL .5 X3 (DISPOSABLE) IMPLANT
SOLUTION ANTFG W/FOAM PAD STRL (MISCELLANEOUS) ×1 IMPLANT
SPONGE TONSIL 1 RF SGL (DISPOSABLE) ×1 IMPLANT
SUCTION COAG ELEC 10 HAND CTRL (ELECTROSURGICAL) ×1 IMPLANT
TUBE SALEM SUMP 16F (TUBING) IMPLANT
TUBING CONNECTING 10 (TUBING) IMPLANT

## 2022-11-20 NOTE — Transfer of Care (Signed)
Immediate Anesthesia Transfer of Care Note  Patient: Colleen Chapman  Procedure(s) Performed: TONSILLECTOMY (Bilateral: Throat)  Patient Location: PACU  Anesthesia Type:General  Level of Consciousness: drowsy and patient cooperative  Airway & Oxygen Therapy: Patient Spontanous Breathing and humidified oxygen tent 70% in use  Post-op Assessment: Report given to RN and Post -op Vital signs reviewed and stable  Post vital signs: stable  Last Vitals:  Vitals Value Taken Time  BP 129/88 11/20/22 0858  Temp 36.4 C 11/20/22 0858  Pulse 77 11/20/22 0900  Resp 14 11/20/22 0900  SpO2 100 % 11/20/22 0900  Vitals shown include unfiled device data.  Last Pain:  Vitals:   11/20/22 0858  TempSrc:   PainSc: Asleep         Complications: No notable events documented.

## 2022-11-20 NOTE — Anesthesia Postprocedure Evaluation (Signed)
Anesthesia Post Note  Patient: Colleen Chapman  Procedure(s) Performed: TONSILLECTOMY (Bilateral: Throat)  Patient location during evaluation: PACU Anesthesia Type: General Level of consciousness: awake and alert Pain management: pain level controlled Vital Signs Assessment: post-procedure vital signs reviewed and stable Respiratory status: spontaneous breathing, nonlabored ventilation, respiratory function stable and patient connected to nasal cannula oxygen Cardiovascular status: blood pressure returned to baseline and stable Postop Assessment: no apparent nausea or vomiting Anesthetic complications: no   There were no known notable events for this encounter.   Last Vitals:  Vitals:   11/20/22 0945 11/20/22 1000  BP: 122/81 122/76  Pulse: (!) 55 63  Resp: 13 12  Temp:  36.4 C  SpO2: 96% 96%    Last Pain:  Vitals:   11/20/22 1000  TempSrc:   PainSc: Asleep                 Cleda Mccreedy Cheetara Hoge

## 2022-11-20 NOTE — Interval H&P Note (Signed)
History and Physical Interval Note:  11/20/2022 7:44 AM  Colleen Chapman  has presented today for surgery, with the diagnosis of hypertrophic tonsils.  The various methods of treatment have been discussed with the patient and family. After consideration of risks, benefits and other options for treatment, the patient has consented to  Procedure(s): TONSILLECTOMY (Bilateral) as a surgical intervention.  The patient's history has been reviewed, patient examined, no change in status, stable for surgery.  I have reviewed the patient's chart and labs.  Questions were answered to the patient's satisfaction.     Reola Mosher S  No changes to H&P

## 2022-11-20 NOTE — Op Note (Signed)
OPERATIVE REPORT  Attending Physician: Magnus Ivan. Okey Dupre, MD, MBA, FARS    Pediatric Otolaryngology   Preoperative Diagnosis: Hx of recurrent right sided peri-tonsillar abscess / recurrent tonsillitis. Postoperative Diagnosis: Same.    Procedure(s) Performed:  Bilateral Tonsillectomy, CPT [id number]  Teaching Surgeon: Magnus Ivan. Okey Dupre, MD, MBA, FARS Assistants: None Dictated   Anesthesia: General  Specimens: None. Drains:  None. Estimated Blood Loss: Less than 10 mL.  Operative Findings:  3+ tonsils; fullness of right peri-tonsillar tissues / pillars  Procedure:  After informed consent was obtained from the patient's parents, the patient was brought from the preoperative holding area to the operating room and placed supine on the operating room table. After smooth induction of general anesthesia, a timeout was performed and all parties were in agreement.   Attention was then turned to the adenoidectomy porion of the procedure.  The patient was turned 90 degrees away from anesthesia and suspended in the Abran Gavigan position using a tongue retractor and mouth gag, gently on the Mayo stand.  The palate was noted to be without submucosal cleft or bifid uvula.  A Foley catheter was then placed through the right nare and tied around the soft palate allowing visualization of the nasopharynx which appeared normal.  The tonsils were then excised bilaterally using bovie electrocautery and bipolar cautery without difficulty.  The oral cavity, oropharynx and nasopharynx were then copiously irrigated with sterile saline.  Adequate hemostasis was assured and the tongue retractor and mouth guard removed.  The stomach was suctioned, and the patient turned 90 degrees back towards anesthesia.   The patient's care was turned over to the Anesthesia team who successfully awakened the patient without event. The patient was transported to the PACU in stable condition. All instrument, sharp and lap counts were correct at the end  of the case.   Teaching Surgeon Attestation:  I was present and participated during all critical and key portions of the procedure(s) and immediately available throughout.  Please see the dictated operative report for details.    Magnus Ivan. Okey Dupre, MD, MBA, FARS Otolaryngology-Head & Neck Surgery

## 2022-11-20 NOTE — Discharge Instructions (Signed)
AMBULATORY SURGERY  DISCHARGE INSTRUCTIONS   The drugs that you were given will stay in your system until tomorrow so for the next 24 hours you should not:  Drive an automobile Make any legal decisions Drink any alcoholic beverage   You may resume regular meals tomorrow.  Today it is better to start with liquids and gradually work up to solid foods.  You may eat anything you prefer, but it is better to start with liquids, then soup and crackers, and gradually work up to solid foods.   Please notify your doctor immediately if you have any unusual bleeding, trouble breathing, redness and pain at the surgery site, drainage, fever, or pain not relieved by medication.    Additional Instructions:

## 2022-11-20 NOTE — Anesthesia Procedure Notes (Signed)
Procedure Name: Intubation Date/Time: 11/20/2022 8:06 AM  Performed by: Maryla Morrow., CRNAPre-anesthesia Checklist: Patient identified, Patient being monitored, Timeout performed, Emergency Drugs available and Suction available Patient Re-evaluated:Patient Re-evaluated prior to induction Oxygen Delivery Method: Circle system utilized Preoxygenation: Pre-oxygenation with 100% oxygen Induction Type: IV induction Ventilation: Mask ventilation without difficulty Laryngoscope Size: 3 and McGraph Grade View: Grade I Tube type: Oral Rae Tube size: 6.5 mm Number of attempts: 1 Placement Confirmation: ETT inserted through vocal cords under direct vision, positive ETCO2 and breath sounds checked- equal and bilateral Secured at: 21 cm Tube secured with: Tape Dental Injury: Teeth and Oropharynx as per pre-operative assessment

## 2022-11-20 NOTE — Anesthesia Preprocedure Evaluation (Signed)
Anesthesia Evaluation  Patient identified by MRN, date of birth, ID band Patient awake    Reviewed: Allergy & Precautions, NPO status , Patient's Chart, lab work & pertinent test results  History of Anesthesia Complications Negative for: history of anesthetic complications  Airway Mallampati: III  TM Distance: >3 FB Neck ROM: full    Dental  (+) Chipped   Pulmonary neg pulmonary ROS, neg shortness of breath   Pulmonary exam normal        Cardiovascular Exercise Tolerance: Good hypertension, (-) angina (-) Past MI Normal cardiovascular exam     Neuro/Psych  PSYCHIATRIC DISORDERS      negative neurological ROS     GI/Hepatic negative GI ROS, Neg liver ROS,neg GERD  ,,  Endo/Other  negative endocrine ROS    Renal/GU      Musculoskeletal   Abdominal   Peds  Hematology negative hematology ROS (+)   Anesthesia Other Findings Past Medical History: 09/11/2020: Abnormal Pap smear of cervix No date: ADHD (attention deficit hyperactivity disorder)     Comment:  ongoing No date: Depression     Comment:  "Because I was forced to have an abortion" No date: History of urinary tract infection 11/2022: Peritonsillar abscess No date: Pregnancy induced hypertension     Comment:  Preeclampsia with 2017 delivery  Past Surgical History: No date: NO PAST SURGERIES  BMI    Body Mass Index: 27.44 kg/m      Reproductive/Obstetrics negative OB ROS                             Anesthesia Physical Anesthesia Plan  ASA: 2  Anesthesia Plan: General ETT   Post-op Pain Management:    Induction: Intravenous  PONV Risk Score and Plan: Ondansetron, Dexamethasone, Midazolam and Treatment may vary due to age or medical condition  Airway Management Planned: Oral ETT  Additional Equipment:   Intra-op Plan:   Post-operative Plan: Extubation in OR  Informed Consent: I have reviewed the patients  History and Physical, chart, labs and discussed the procedure including the risks, benefits and alternatives for the proposed anesthesia with the patient or authorized representative who has indicated his/her understanding and acceptance.     Dental Advisory Given  Plan Discussed with: Anesthesiologist, CRNA and Surgeon  Anesthesia Plan Comments: (Patient consented for risks of anesthesia including but not limited to:  - adverse reactions to medications - damage to eyes, teeth, lips or other oral mucosa - nerve damage due to positioning  - sore throat or hoarseness - Damage to heart, brain, nerves, lungs, other parts of body or loss of life  Patient voiced understanding.)       Anesthesia Quick Evaluation

## 2022-11-21 ENCOUNTER — Encounter: Payer: Self-pay | Admitting: Otolaryngology

## 2022-11-21 LAB — SURGICAL PATHOLOGY

## 2023-02-21 DIAGNOSIS — Z113 Encounter for screening for infections with a predominantly sexual mode of transmission: Secondary | ICD-10-CM | POA: Diagnosis not present

## 2023-05-19 DIAGNOSIS — Z113 Encounter for screening for infections with a predominantly sexual mode of transmission: Secondary | ICD-10-CM | POA: Diagnosis not present

## 2023-05-20 DIAGNOSIS — Z113 Encounter for screening for infections with a predominantly sexual mode of transmission: Secondary | ICD-10-CM | POA: Diagnosis not present

## 2023-06-18 DIAGNOSIS — Z3202 Encounter for pregnancy test, result negative: Secondary | ICD-10-CM | POA: Diagnosis not present

## 2023-06-18 DIAGNOSIS — Z113 Encounter for screening for infections with a predominantly sexual mode of transmission: Secondary | ICD-10-CM | POA: Diagnosis not present

## 2023-06-18 DIAGNOSIS — Z7251 High risk heterosexual behavior: Secondary | ICD-10-CM | POA: Diagnosis not present

## 2023-07-21 DIAGNOSIS — R300 Dysuria: Secondary | ICD-10-CM | POA: Diagnosis not present

## 2023-07-21 DIAGNOSIS — N390 Urinary tract infection, site not specified: Secondary | ICD-10-CM | POA: Diagnosis not present

## 2023-08-03 DIAGNOSIS — R350 Frequency of micturition: Secondary | ICD-10-CM | POA: Diagnosis not present

## 2023-08-03 DIAGNOSIS — N390 Urinary tract infection, site not specified: Secondary | ICD-10-CM | POA: Diagnosis not present

## 2023-08-16 ENCOUNTER — Emergency Department
Admission: EM | Admit: 2023-08-16 | Discharge: 2023-08-16 | Disposition: A | Discharge status: Home / Self Care | Attending: Emergency Medicine | Admitting: Emergency Medicine

## 2023-08-16 ENCOUNTER — Other Ambulatory Visit: Payer: Self-pay

## 2023-08-16 ENCOUNTER — Emergency Department

## 2023-08-16 DIAGNOSIS — G43009 Migraine without aura, not intractable, without status migrainosus: Secondary | ICD-10-CM | POA: Diagnosis not present

## 2023-08-16 DIAGNOSIS — R519 Headache, unspecified: Secondary | ICD-10-CM | POA: Diagnosis not present

## 2023-08-16 DIAGNOSIS — G43019 Migraine without aura, intractable, without status migrainosus: Secondary | ICD-10-CM | POA: Diagnosis not present

## 2023-08-16 DIAGNOSIS — M791 Myalgia, unspecified site: Secondary | ICD-10-CM | POA: Insufficient documentation

## 2023-08-16 LAB — POC URINE PREG, ED: Preg Test, Ur: NEGATIVE

## 2023-08-16 LAB — RESP PANEL BY RT-PCR (RSV, FLU A&B, COVID)  RVPGX2
Influenza A by PCR: NEGATIVE
Influenza B by PCR: NEGATIVE
Resp Syncytial Virus by PCR: NEGATIVE
SARS Coronavirus 2 by RT PCR: NEGATIVE

## 2023-08-16 MED ORDER — IBUPROFEN 800 MG PO TABS: 800.0000 mg | ORAL_TABLET | Freq: Three times a day (TID) | ORAL | 0 refills | Status: AC | PRN

## 2023-08-16 MED ORDER — PROMETHAZINE HCL 25 MG/ML IJ SOLN: 25.0000 mg | Freq: Four times a day (QID) | INTRAMUSCULAR | Status: DC | PRN

## 2023-08-16 MED ORDER — SUMATRIPTAN SUCCINATE 6 MG/0.5ML ~~LOC~~ SOLN
6.0000 mg | Freq: Once | SUBCUTANEOUS | Status: AC
  Administered 2023-08-16: 6 mg via SUBCUTANEOUS
  Filled 2023-08-16: qty 0.5

## 2023-08-16 MED ORDER — SODIUM CHLORIDE 0.9 % IV BOLUS
500.0000 mL | Freq: Once | INTRAVENOUS | Status: AC
  Administered 2023-08-16: 500 mL via INTRAVENOUS

## 2023-08-16 MED ORDER — KETOROLAC TROMETHAMINE 30 MG/ML IJ SOLN
30.0000 mg | Freq: Once | INTRAMUSCULAR | Status: AC
  Administered 2023-08-16: 30 mg via INTRAMUSCULAR
  Filled 2023-08-16: qty 1

## 2023-08-16 MED ORDER — DIPHENHYDRAMINE HCL 25 MG PO CAPS
25.0000 mg | ORAL_CAPSULE | Freq: Once | ORAL | Status: AC
  Administered 2023-08-16: 25 mg via ORAL
  Filled 2023-08-16: qty 1

## 2023-08-16 NOTE — ED Triage Notes (Signed)
 To ED for HA and nausea since 1 week. Started new job recently, works all day in the sun. Also body aches. Poor appetite. States feels like dehydrated.

## 2023-08-16 NOTE — ED Provider Notes (Signed)
 [facility] Provider Note    Event Date/Time   First MD Initiated Contact with Patient 08/16/23 1455     (approximate)   History   Headache   HPI Colleen Chapman is a 28 y.o. female presenting to the emergency department with headache, nausea, body aches x 1 week.  Patient states that headache is unilateral on the left side, worsening for the past 3 days, pulsating, has photophobia, and worse with activity.  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  Says she has had headaches before and takes Tylenol , but she took Tylenol  this time and it has not been working. Also states this headache is different from prior ones. She is adopted.  States she does not have a history of migraines.  Past medical history includes depression.  Denies loss of consciousness, fall, injury, vision changes, fever, weakness or numbness in any arm or leg.  Known allergy to Bactrim .   Call your primary care provider or return to the emergency department if you have any of the following: sudden, severe headache; worsening headache, slurred speech, facial droop, confusion, weakness or numbness in any arm or leg, extreme fatigue, or any other symptoms that concern you.    Physical Exam   Triage Vital Signs: ED Triage Vitals  Encounter Vitals Group     BP 08/16/23 1355 (!) 128/95     Girls Systolic BP Percentile --      Girls Diastolic BP Percentile --      Boys Systolic BP Percentile --      Boys Diastolic BP Percentile --      Pulse Rate 08/16/23 1355 86     Resp 08/16/23 1355 20     Temp 08/16/23 1355 98.6 F (37 C)     Temp Source 08/16/23 1355 Oral     SpO2 08/16/23 1355 100 %     Weight 08/16/23 1354 180 lb (81.6 kg)     Height 08/16/23 1354 5' 6 (1.676 m)     Head Circumference --      Peak Flow --      Pain Score 08/16/23 1353 4     Pain Loc --      Pain Education --      Exclude from Growth Chart --      Most recent vital signs: Vitals:   08/16/23 1355  BP: (!) 128/95  Pulse: 86  Resp: 20  Temp: 98.6 F (37 C)  SpO2: 100%    General: Well-appearing, in no acute distress. Appears stated age. Head: Normocephalic, atraumatic. Eyes: PERRLA. EOMs intact. No scleral icterus or conjunctival injection. Distant visual acuity is grossly normal. Ears/Nose/Throat: TMs intact b/l. Nares patent, no nasal discharge. Oropharynx moist, no erythema or exudate. Dentition intact. Neck: Supple, no lymphadenopathy. CV: Regular rate, 86 bpm. Peripheral pulses 2+ and symmetric.  Respiratory: Breath sounds clear b/l. No wheezes, rales, or rhonchi. No respiratory distress. Normal respiratory effort. GI: Soft, non-distended. Skin:Warm, dry, intact. No rashes, lesions, or ecchymosis. No cyanosis or pallor. Neurological: A&Ox4 to person, place, time, and situation. Cranial nerves II-XII intact. No focal deficits. Negative Brudzinski and Kernig signs. Psychiatric: Mood and affect appropriate. Thought processes coherent.   ED Results / Procedures / Treatments   Labs (all labs ordered are listed, but only abnormal results are displayed) Labs Reviewed  RESP PANEL BY RT-PCR (RSV, FLU A&B, COVID)  [id number]  POC URINE PREG, ED  EKG     RADIOLOGY CT head w/o contrast ordered.  IMPRESSION: No acute intracranial abnormality.   PROCEDURES:  Critical Care performed: No  Procedures   MEDICATIONS ORDERED IN ED: Medications  promethazine (PHENERGAN) injection 25 mg (has no administration in time range)  ketorolac  (TORADOL ) 30 MG/ML injection 30 mg (30 mg Intramuscular Given 08/16/23 1642)  SUMAtriptan (IMITREX) injection 6 mg (6 mg Subcutaneous Given 08/16/23 1647)  diphenhydrAMINE  (BENADRYL ) capsule 25 mg (25 mg Oral Given 08/16/23 1642)  sodium chloride  0.9 % bolus 500 mL (500 mLs Intravenous New Bag/Given 08/16/23 1640)     IMPRESSION / MDM / ASSESSMENT AND PLAN / ED COURSE  I reviewed the  triage vital signs and the nursing notes.                              Differential diagnosis includes, but is not limited to, migraine, tension headache, cluster headache, COVID, flu, RSV, brain mass, sinusitis  Patient's presentation is most consistent with acute complicated illness / injury requiring diagnostic workup.  Patient is a 28 year old female presenting today for headache, nausea and bodyaches x 1 week.  Patient states she has had headaches in the past treatable with Tylenol  but this one feels different.  Respiratory panel was ordered, she was negative for COVID, flu, and RSV.  I gave her a migraine cocktail of Toradol  30 mg, Phenergan 25 mg, sumatriptan 6 mg, and diphenhydramine  50 mg.  She was also given one round of fluids. Reevaluation shows that she is feeling better.  As this headache is different from previous ones and she does not have a history of migraines, I also ordered a CT of her head without contrast.  I personally reviewed the radiologist report that states there are no acute intracranial abnormalities. Discussed these findings with her.  She can pick up and take prescribed ibuprofen  800 mg as needed for recurrent headaches.  Advised her not to take Advil , Aleve , or Motrin  while on this medication and to stop the medication if she experiences any symptoms of GI bleed.  Emergency department return precautions were discussed with the patient.  Patient is in agreement to the treatment plan.  Patient is stable for discharge.    FINAL CLINICAL IMPRESSION(S) / ED DIAGNOSES   Final diagnoses:  Intractable migraine without aura and without status migrainosus     Rx / DC Orders   ED Discharge Orders          Ordered    ibuprofen  (ADVIL ) 800 MG tablet  Every 8 hours PRN        08/16/23 1812             Note:  This document was prepared using Dragon voice recognition software and may include unintentional dictation errors.    Thomasenia Flesher, PA-C 08/16/23  1820    Shane Darling, MD 08/16/23 [phone]

## 2023-08-16 NOTE — Discharge Instructions (Addendum)
 You have been seen in the Emergency Department (ED) for a migraine.  Please take any regular medications that have been prescribed for you. Please do not use aleve , advil , or motrin  while on this medication.  Please stop taking this medication if you have any stomach cramping or pain in your abdomen.  Please follow up with your doctor as soon as possible regarding today's ED visit and your headache symptoms.    Call your doctor or return to the Emergency Department (ED) if you have a worsening headache, sudden and severe headache, confusion, slurred speech, facial droop, weakness or numbness in any arm or leg, extreme fatigue, or other symptoms that concern you.

## 2023-08-16 NOTE — ED Notes (Signed)
 Patient denies dizziness, drowsiness. Patient states headache pain is gone. Patient is alert and oriented x4, able to maintain posture and balance in an upright position. Patient denies nausea. No vision changes reported.

## 2023-09-04 ENCOUNTER — Telehealth: Payer: Self-pay

## 2023-09-04 DIAGNOSIS — G43101 Migraine with aura, not intractable, with status migrainosus: Secondary | ICD-10-CM

## 2023-09-04 NOTE — Progress Notes (Signed)
 Complex Care Management Note Care Guide Note  09/04/2023 Name: Colleen Chapman MRN: [id number] DOB: 09-Jul-1995   Complex Care Management Outreach Attempts: An unsuccessful telephone outreach was attempted today to offer the patient information about available complex care management services.  Follow Up Plan:  Additional outreach attempts will be made to offer the patient complex care management information and services.   Encounter Outcome:  No Answer  Leotis Rase [facility], [facility] Guide  Direct Dial: [phone]  Fax [phone]

## 2023-09-16 NOTE — Progress Notes (Unsigned)
 Complex Care Management Note Care Guide Note  09/16/2023 Name: Colleen Chapman MRN: [id number] DOB: 09-05-1995   Complex Care Management Outreach Attempts: A second unsuccessful outreach was attempted today to offer the patient with information about available complex care management services.  Follow Up Plan:  Additional outreach attempts will be made to offer the patient complex care management information and services.   Encounter Outcome:  No Answer  Leotis Rase [facility], [facility] Guide  Direct Dial: [phone]  Fax [phone]

## 2023-09-17 NOTE — Progress Notes (Signed)
 Complex Care Management Note Care Guide Note  09/17/2023 Name: Colleen Chapman MRN: [id number] DOB: Jun 17, 1995   Complex Care Management Outreach Attempts: A third unsuccessful outreach was attempted today to offer the patient with information about available complex care management services.  Follow Up Plan:  No further outreach attempts will be made at this time. We have been unable to contact the patient to offer or enroll patient in complex care management services.  Encounter Outcome:  No Answer  Leotis Rase [facility], [facility] Guide  Direct Dial: [phone]  Fax [phone]

## 2023-09-18 DIAGNOSIS — M545 Low back pain, unspecified: Secondary | ICD-10-CM | POA: Diagnosis not present

## 2023-09-18 DIAGNOSIS — R110 Nausea: Secondary | ICD-10-CM | POA: Diagnosis not present

## 2023-09-18 DIAGNOSIS — Z3202 Encounter for pregnancy test, result negative: Secondary | ICD-10-CM | POA: Diagnosis not present

## 2023-09-18 DIAGNOSIS — N926 Irregular menstruation, unspecified: Secondary | ICD-10-CM | POA: Diagnosis not present

## 2023-09-18 DIAGNOSIS — Z113 Encounter for screening for infections with a predominantly sexual mode of transmission: Secondary | ICD-10-CM | POA: Diagnosis not present

## 2023-11-07 ENCOUNTER — Emergency Department
Admission: EM | Admit: 2023-11-07 | Discharge: 2023-11-07 | Disposition: A | Discharge status: Home / Self Care | Attending: Emergency Medicine | Admitting: Emergency Medicine

## 2023-11-07 ENCOUNTER — Emergency Department

## 2023-11-07 ENCOUNTER — Other Ambulatory Visit: Payer: Self-pay

## 2023-11-07 DIAGNOSIS — N939 Abnormal uterine and vaginal bleeding, unspecified: Secondary | ICD-10-CM | POA: Diagnosis not present

## 2023-11-07 DIAGNOSIS — R102 Pelvic and perineal pain: Secondary | ICD-10-CM | POA: Diagnosis not present

## 2023-11-07 DIAGNOSIS — N926 Irregular menstruation, unspecified: Secondary | ICD-10-CM | POA: Insufficient documentation

## 2023-11-07 LAB — CBC WITH DIFFERENTIAL/PLATELET
Abs Immature Granulocytes: 0.02 10*3/uL (ref 0.00–0.07)
Basophils Absolute: 0 10*3/uL (ref 0.0–0.1)
Basophils Relative: 0 %
Eosinophils Absolute: 0.1 10*3/uL (ref 0.0–0.5)
Eosinophils Relative: 2 %
HCT: 39.9 % (ref 36.0–46.0)
Hemoglobin: 13.8 g/dL (ref 12.0–15.0)
Immature Granulocytes: 0 %
Lymphocytes Relative: 37 %
Lymphs Abs: 2.2 10*3/uL (ref 0.7–4.0)
MCH: 30.9 pg (ref 26.0–34.0)
MCHC: 34.6 g/dL (ref 30.0–36.0)
MCV: 89.5 fL (ref 80.0–100.0)
Monocytes Absolute: 0.3 10*3/uL (ref 0.1–1.0)
Monocytes Relative: 6 %
Neutro Abs: 3.3 10*3/uL (ref 1.7–7.7)
Neutrophils Relative %: 55 %
Platelets: 322 10*3/uL (ref 150–400)
RBC: 4.46 MIL/uL (ref 3.87–5.11)
RDW: 12 % (ref 11.5–15.5)
WBC: 6 10*3/uL (ref 4.0–10.5)
nRBC: 0 % (ref 0.0–0.2)

## 2023-11-07 LAB — URINALYSIS, ROUTINE W REFLEX MICROSCOPIC
Bilirubin Urine: NEGATIVE
Glucose, UA: NEGATIVE mg/dL
Hgb urine dipstick: NEGATIVE
Ketones, ur: NEGATIVE mg/dL
Nitrite: NEGATIVE
Protein, ur: NEGATIVE mg/dL
Specific Gravity, Urine: 1.027 (ref 1.005–1.030)
pH: 6 (ref 5.0–8.0)

## 2023-11-07 LAB — HCG, QUANTITATIVE, PREGNANCY: hCG, Beta Chain, Quant, S: 1 m[IU]/mL (ref <5)

## 2023-11-07 LAB — PREGNANCY, URINE: Preg Test, Ur: NEGATIVE

## 2023-11-07 NOTE — Discharge Instructions (Addendum)
 Your exam, labs, and ultrasound are normal and reassuring at this time.  Your blood and urine pregnancy test were both negative.  Your ultrasound shows no abnormal findings.  Follow-up with your primary provider for ongoing evaluation.  Return to the ED if needed.

## 2023-11-07 NOTE — ED Triage Notes (Signed)
 Pt comes with vaginal bleeding since Monday. Pt took test today and it was positive. Pt states no bleeding today.

## 2023-11-07 NOTE — ED Provider Notes (Signed)
 [facility] Emergency Department Provider Note     Event Date/Time   First MD Initiated Contact with Patient 11/07/23 1910     (approximate)   History   Vaginal Bleeding   HPI  Colleen Chapman is a 28 y.o. female at [facility], presented to the ED concern for possible pregnancy.  She initially reported her LMP was 7/25, but after further discussion, it turns out her most recent LMP was 9/1.  Patient reports she had a positive home pregnancy test today.  She shows a phone clip of the result which shows an extremely faint.  She endorses some vaginal bleeding with onset Monday, that she describes as similar to her normal menses.  Physical Exam   Triage Vital Signs: ED Triage Vitals [11/07/23 1839]  Encounter Vitals Group     BP 131/84     Girls Systolic BP Percentile      Girls Diastolic BP Percentile      Boys Systolic BP Percentile      Boys Diastolic BP Percentile      Pulse Rate 70     Resp 18     Temp 98 F (36.7 C)     Temp src      SpO2 100 %     Weight 175 lb (79.4 kg)     Height 5' 6 (1.676 m)     Head Circumference      Peak Flow      Pain Score 0     Pain Loc      Pain Education      Exclude from Growth Chart     Most recent vital signs: Vitals:   11/07/23 1839  BP: 131/84  Pulse: 70  Resp: 18  Temp: 98 F (36.7 C)  SpO2: 100%    General Awake, no distress. NAD HEENT NCAT. PERRL. EOMI. No rhinorrhea. Mucous membranes are moist. CV:  Good peripheral perfusion. RRR RESP:  Normal effort. CTA ABD:  No distention.  GYN:  deferred   ED Results / Procedures / Treatments   Labs (all labs ordered are listed, but only abnormal results are displayed) Labs Reviewed  URINALYSIS, ROUTINE W REFLEX MICROSCOPIC - Abnormal; Notable for the following components:      Result Value   Color, Urine YELLOW (*)    APPearance HAZY (*)    Leukocytes,Ua SMALL (*)    Bacteria, UA RARE (*)    All other components within normal limits   CBC WITH DIFFERENTIAL/PLATELET  HCG, QUANTITATIVE, PREGNANCY  PREGNANCY, URINE   EKG   RADIOLOGY  I personally viewed and evaluated these images as part of my medical decision making, as well as reviewing the written report by the radiologist.  ED Provider Interpretation: Normal transabdominal pelvic ultrasound  US  PELVIS (TRANSABDOMINAL ONLY) Result Date: 11/07/2023 CLINICAL DATA:  Pelvic pain EXAM: TRANSABDOMINAL ULTRASOUND OF PELVIS TECHNIQUE: Transabdominal ultrasound examination of the pelvis was performed including evaluation of the uterus, ovaries, adnexal regions, and pelvic cul-de-sac. COMPARISON:  None Available. FINDINGS: Uterus Measurements: 8.9 x 5.1 x 6.9 cm = volume: 166.2 mL. No fibroids or other mass visualized. Endometrium Thickness: 5.2 mm.  No focal abnormality visualized. Right ovary Measurements: 4.3 x 2.4 x 3 cm = volume: 16.3 mL. Normal appearance/no adnexal mass. Left ovary Measurements: 4.7 x 2.4 x 2.5 cm = volume: 14.5 mL. Normal appearance/no adnexal mass. Other findings:  No abnormal free fluid. IMPRESSION: Negative pelvic ultrasound. Electronically Signed   By: Luke Bun  M.D.   On: 11/07/2023 19:58     PROCEDURES:  Critical Care performed: No  Procedures   MEDICATIONS ORDERED IN ED: Medications - No data to display   IMPRESSION / MDM / ASSESSMENT AND PLAN / ED COURSE  I reviewed the triage vital signs and the nursing notes.                              Differential diagnosis includes, but is not limited to, ovarian cyst, ovarian torsion, acute appendicitis, diverticulitis, urinary tract infection/pyelonephritis, bowel obstruction, colitis, renal colic, gastroenteritis, hernia, fibroids, endometriosis, pregnancy related pain including ectopic pregnancy, etc.  Patient's presentation is most consistent with acute complicated illness / injury requiring diagnostic workup.  Patient's diagnosis is consistent with likely menses earlier this week and a  false positive home UPT.  Patient with a reassuring send workup at this time.  Both her blood and urine pregnancy test here are negative.  Ultrasound shows normal pelvic organs without evidence of an adnexal mass or ovarian cyst.  Patient is to follow up with her primary provider as needed or otherwise directed. Patient is given ED precautions to return to the ED for any worsening or new symptoms.  FINAL CLINICAL IMPRESSION(S) / ED DIAGNOSES   Final diagnoses:  Vaginal bleeding  Abnormal menses     Rx / DC Orders   ED Discharge Orders     None        Note:  This document was prepared using Dragon voice recognition software and may include unintentional dictation errors.    Loyd Candida LULLA Aldona, PA-C 11/07/23 2024    Willo Dunnings, MD 11/08/23 [phone]

## 2023-12-04 NOTE — Telephone Encounter (Signed)
 Called and lvm to call back to discuss lab results.   There was a delay in receiving the [facility] results. We experienced an unexpected influx of lab results coming through our queue at a later time than usual. At this time, it's unclear what caused the delay. Our director has submitted a ticket to [facility] to investigate and provide clarity on the issue.

## 2023-12-04 NOTE — Telephone Encounter (Addendum)
 Spoke with patient regarding her elevated RPR titer 1:1 with positive RPR. Advised patient to get this repeated via health care department with Treponema pallidium Ab testing to assure no true syphilis case.   Discussed with patient that case can be a false positive. The reactive RPR at a low titer is likely nonspecific but needs to be confirmed on repeat sample with above test.   Apologized for the delay in receiving the [facility] results. Discussed with patient that we experienced an unexpected influx of lab results coming through our queue at a later time than usual. Discussed with patient that it's unclear what caused the delay. Advised that our director has submitted a ticket to [facility] to investigate and provide clarity on the issue.

## 2023-12-05 ENCOUNTER — Ambulatory Visit

## 2023-12-05 DIAGNOSIS — Z113 Encounter for screening for infections with a predominantly sexual mode of transmission: Secondary | ICD-10-CM

## 2023-12-05 LAB — WET PREP FOR TRICH, YEAST, CLUE
Clue Cell Exam: NEGATIVE
Trichomonas Exam: NEGATIVE
Yeast Exam: NEGATIVE

## 2023-12-05 LAB — HM HIV SCREENING LAB: HM HIV Screening: NEGATIVE

## 2023-12-05 NOTE — Progress Notes (Signed)
 Pt is here STD screening. Wet Prep results reviewed with pt and requires no treatment per S.O. Condoms declined. Kwadwo Aceton Kinnear,RN.

## 2023-12-05 NOTE — Progress Notes (Signed)
 [facility] Department STI clinic [address] [place] [address] Main phone: [phone]  STI screening visit  Subjective:  Colleen Chapman is a 28 y.o. female being seen today for an STI screening visit. The patient reports they do not have symptoms.  Patient reports that they do not desire a pregnancy in the next year. Patient is currently using no method - no contraceptive precautions to prevent pregnancy. They reported they are not interested in discussing contraception today.    Patient's last menstrual period was 11/22/2023 (exact date).  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 She was referred here for confirmatory testing. She never had a chancre, has not had any symptoms. Did have an itchy rash on her arm, feet but was working outside at the time over the summer. Also desires full STI testing. Has been having regular unprotected sex with a female partner who has other partners.  Does the patient using douching products? No  See flowsheet for further details and programmatic requirements Hyperlink available at the top of the signed note in blue.  Flow sheet content below:  Pregnancy Intention Screening Does the patient want to become pregnant in the next year?: No Does the patient's partner want to  become pregnant in the next year?: No Would the patient like to discuss contraceptive options today?: No Reason For STD Screen STD Screening: Is asymptomatic Have you ever had an STD?: No History of Antibiotic use in the past 2 weeks?: No STD Symptoms Denies all: Yes Risk Factors for Hep B Household, sexual, or needle sharing contact of a person infected with Hep B: No Sexual contact with a person who uses drugs not as prescribed?: No Currently or Ever used drugs not as prescribed: No HIV Positive: No PRep Patient: No Men who have sex with men: No Have Hepatitis C: No History of Incarceration: No History of Homeslessness?: No Anal sex following anal drug use?: No Risk Factors for Hep C Currently using drugs not as prescribed: No Sexual partner(s) currently using drugs as not prescribed: No History of drug use: No HIV Positive: No People with a history of incarceration: No People born between the years of 65 and 76: No Abuse History Has patient ever been abused physically?: No Has patient ever been abused sexually?: No Does patient feel they have a problem with Anxiety?: Yes Does patient feel they have a problem with Depression?: No Referral to Behavioral Health: Declined Counseling Patient counseled to use condoms with all sex: Condoms declined RTC in 2-3 weeks for test results: Yes Clinic will call if test results abnormal before test result appt.: Yes Immunizations: Referred Test results given to patient Patient counseled to use condoms with all sex: Condoms declined   Screening for MPX risk:  Unexplained rash?  No   MSM?  No   Multiple or  anonymous sex partners?  No   Any close or sexual contact with a person  diagnosed with MPX?  No   Any outside the [place]  where MPX is endemic?  No   High clinical suspicion for MPX?    -Unlikely to be chickenpox    -Lymphadenopathy    -Rash that presents in same phase of       evolution on any given body part  No    Screenings: Last HIV test per patient/review of record was  Lab Results  Component Value Date   HMHIVSCREEN Negative - Validated 04/17/2018    Lab Results  Component Value Date   HIV Non Reactive 09/28/2022     Last HEPC test per patient/review of record was No results found for: HMHEPCSCREEN No components found for: HEPC   Last HEPB test per patient/review of record was No components found for: HMHEPBSCREEN   Patient reports last pap was:   Lab Results  Component Value Date   SPECADGYN Comment 09/11/2020   Result Date Procedure Results Follow-ups  09/11/2020 IGP, rfx Aptima HPV ASCU DIAGNOSIS:: Comment Specimen adequacy:: Comment Clinician Provided ICD10: Comment Performed by:: Comment PAP Smear Comment: . Note:: Comment Test Methodology: Comment PAP Reflex: Comment   09/08/2019 IGP, rfx Aptima HPV ASCU DIAGNOSIS:: Comment (A) Specimen adequacy:: Comment Clinician Provided ICD10: Comment Performed by:: Comment Electronically signed by:: Comment PAP Smear Comment: . PATHOLOGIST PROVIDED ICD10:: Comment Note:: Comment Test Methodology: Comment PAP Reflex: Comment   06/25/2016 HM PAP SMEAR HM Pap smear: Negative    Immunization history:  Immunization History  Administered Date(s) Administered   Hepatitis A, Ped/Adol-2 Dose 01/18/2011, 09/10/2012   Hepatitis B, PED/ADOLESCENT 1995-10-15, 02/06/1995, 09/12/1995   Hpv-Unspecified 04/01/2006, 06/03/2006, 09/26/2007   Influenza,inj,Quad PF,6+ Mos 12/30/2018   MMR 06/03/1996, 11/28/1998   Meningococcal Conjugate 10/04/1998, 01/16/2010   Tdap 04/14/2019   Varicella 12/24/1995, 04/01/2006    The following portions of the patient's history were reviewed and updated as appropriate: allergies, current medications, past medical history, past social history, past surgical history and problem list.  Objective:  There were no vitals filed for this visit.  Physical Exam Constitutional:      Appearance: Normal  appearance.  HENT:     Head: Normocephalic.     Mouth/Throat:     Mouth: Mucous membranes are moist.     Pharynx: Oropharynx is clear. No pharyngeal swelling, oropharyngeal exudate or posterior oropharyngeal erythema.  Eyes:     General: No scleral icterus.       Right eye: No discharge.        Left eye: No discharge.  Pulmonary:     Effort: Pulmonary effort is normal.  Lymphadenopathy:     Cervical: No cervical adenopathy.  Skin:    General: Skin is warm and dry.  Neurological:     Mental Status: She is alert.  Psychiatric:        Mood and Affect: Mood normal.        Behavior: Behavior normal.    Assessment and Plan:  Colleen Chapman is a 28 y.o. female presenting to the [facility] Department for STI screening  1. Screening for venereal disease (Primary)  - Chlamydia/Gonorrhea [facility] - WET PREP FOR TRICH, YEAST, CLUE - HIV [place] [facility] - Syphilis Serology, [place] [facility] - Gonococcus culture - Gonococcus culture   Patient accepted the following screenings: anal GC culture, oral GC culture, vaginal CT/GC swab, vaginal wet prep, HIV, and RPR Patient meets  criteria for HepB screening? No. Ordered? no Patient meets criteria for HepC screening? No. Ordered? no  Treat wet prep per standing order Discussed time line for [facility] results and that patient will be called with positive results and encouraged patient to call if she had not heard in 2 weeks.  Counseled to return or seek care for continued or worsening symptoms Recommended repeat testing in 3 months with positive results. Recommended condom use with all sex for STI prevention.   Return as needed for STI testing.  No future appointments.  Damien FORBES Satchel, NP

## 2023-12-09 LAB — SYPHILIS SEROLOGY, ~~LOC~~ LAB
RPR, Quant: 1:8 {titer}
RPR: REACTIVE
Syphilis Treponemal Ab: NONREACTIVE

## 2023-12-10 LAB — GONOCOCCUS CULTURE

## 2023-12-17 ENCOUNTER — Other Ambulatory Visit: Payer: Self-pay

## 2023-12-17 ENCOUNTER — Emergency Department
Admission: EM | Admit: 2023-12-17 | Discharge: 2023-12-17 | Disposition: A | Discharge status: Home / Self Care | Attending: Emergency Medicine | Admitting: Emergency Medicine

## 2023-12-17 DIAGNOSIS — A539 Syphilis, unspecified: Secondary | ICD-10-CM | POA: Diagnosis not present

## 2023-12-17 MED ORDER — PENICILLIN G BENZATHINE 1200000 UNIT/2ML IM SUSY
2.4000 10*6.[IU] | PREFILLED_SYRINGE | Freq: Once | INTRAMUSCULAR | Status: AC
  Administered 2023-12-17: 2.4 10*6.[IU] via INTRAMUSCULAR
  Filled 2023-12-17: qty 4

## 2023-12-17 NOTE — ED Provider Notes (Addendum)
 [facility] Provider Note    Event Date/Time   First MD Initiated Contact with Patient 12/17/23 2112     (approximate)   History   Exposure to STD    HPI  Colleen Chapman is a 28 y.o. female    with a past medical history of vaginal bleeding, migraine, tonsillectomy, peritonsillar abscess, impetigo, cystitis, who presents to the ED complaining of positive for syphilis. According to the patient, she went to urgent care she was tested for all the STDs and 2 weeks ago they informed her about being positive for syphilis.  Patient states she has palmar rash, plantar rash and headache 3 months ago.  She has 2 partners.  Patient is allergic to sulfas.  Patient is here with 3 kids.  Patient does not have a primary care physician.     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 01/04/2019   History of postpartum depression 2016 12/30/2018   Hx of preeclampsia, prior pregnancy 2017 12/30/2018   ADHD 12/30/2018   Family history of deafness in mom 12/30/2018   Depression 04/17/2018     ROS: Patient currently denies any vision changes, tinnitus, difficulty speaking, facial droop, sore throat, chest pain, shortness of breath, abdominal pain, nausea/vomiting/diarrhea, dysuria, or weakness/numbness/paresthesias in any extremity   Physical Exam   Triage Vital Signs: ED Triage Vitals  Encounter Vitals Group     BP 12/17/23 1945 (!) 141/95     Girls Systolic BP Percentile --      Girls Diastolic BP Percentile --      Boys Systolic BP Percentile --      Boys Diastolic BP Percentile --      Pulse Rate 12/17/23 1945 78     Resp 12/17/23 1945 17     Temp 12/17/23 1945 97.7 F (36.5 C)     Temp src --      SpO2 12/17/23 1945 98 %      Weight 12/17/23 1944 170 lb (77.1 kg)     Height 12/17/23 1944 5' 6 (1.676 m)     Head Circumference --      Peak Flow --      Pain Score 12/17/23 1943 0     Pain Loc --      Pain Education --      Exclude from Growth Chart --     Most recent vital signs: Vitals:   12/17/23 1945  BP: (!) 141/95  Pulse: 78  Resp: 17  Temp: 97.7 F (36.5 C)  SpO2: 98%     Physical Exam Vitals and nursing note reviewed.   General:          Awake, no distress.  CV:                  Good peripheral perfusion.  Resp:               Normal effort. no tachypnea Abd:                 No distention.  Soft nontender Other:              No rashes ED Results / Procedures / Treatments   Labs (all labs ordered are listed, but only abnormal  results are displayed) Labs Reviewed - No data to display    PROCEDURES:  Critical Care performed:   Procedures   MEDICATIONS ORDERED IN ED: Medications  penicillin g benzathine (BICILLIN LA) 1200000 UNIT/2ML injection 2.4 Million Units (2.4 Million Units Intramuscular Given 12/17/23 2140)      IMPRESSION / MDM / ASSESSMENT AND PLAN / ED COURSE  I reviewed the triage vital signs and the nursing notes.  Differential diagnosis includes, but is not limited to, syphilis, HIV, gonorrhea  Patient's presentation is most consistent with acute, uncomplicated illness.   Colleen Chapman is a 28 y.o., female who presents today after being informed that she was positive for syphilis.  Patient endorses 3 months ago she was having rash on on her palms and plantar area of the feet.  Sickle exam is within normal limits, no evidence of rash.  Plan Benzathine penicillin 2.4 mg IM Patient's diagnosis is consistent with syphilis. I did not order any imaging or labs. I did review the patient's allergies and medications.The patient is in stable and satisfactory condition for discharge home  Patient will be discharged home without prescriptions patient is to  follow up with primary care physician as needed or otherwise directed. Patient is given ED precautions to return to the ED for any worsening or new symptoms.  I we will refer her to [facility] clinic to establish care.  Advised patient to notify her partners to be tested for syphilis. Discussed plan of care with patient, answered all of patient's questions, Patient agreeable to plan of care. Advised patient to take medications according to the instructions on the label. Discussed possible side effects of new medications. Patient verbalized understanding.  FINAL CLINICAL IMPRESSION(S) / ED DIAGNOSES   Final diagnoses:  Syphilis     Rx / DC Orders   ED Discharge Orders     None        Note:  This document was prepared using Dragon voice recognition software and may include unintentional dictation errors.   Janit Kast, PA-C 12/17/23 2315    Janit Kast, PA-C 12/17/23 2315    Dorothyann Drivers, MD 12/17/23 2326

## 2023-12-17 NOTE — ED Triage Notes (Signed)
 Pt reports she just received her mychart results that tested +Syphilis. Pt reports she was tested 2 weeks ago.

## 2023-12-17 NOTE — Discharge Instructions (Addendum)
 You have been diagnosed with syphilis.  You received today the treatment with penicillin 2.4 mg intramuscular.  Please notify your partners to be tested.  Please call [facility] to make an appointment and establish care with a primary care physician .Please come back to ED or go to your PCP if you have new symptoms symptoms worsen.

## 2023-12-18 ENCOUNTER — Ambulatory Visit: Payer: Self-pay | Admitting: Family Medicine

## 2023-12-18 NOTE — Progress Notes (Signed)
 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 Would only treat as syphilis infection if patient has positive RPR titer AND positive treponemal antibodies.   Dorothyann Helling, MD 12/18/23  9:20 AM
